# Patient Record
Sex: Female | Born: 1950 | Race: White | Hispanic: No | Marital: Married | Smoking: Never smoker
Health system: Southern US, Community
[De-identification: ages and names within clinical notes are randomized; demographics above are authoritative.]

## PROBLEM LIST (undated history)

## (undated) DIAGNOSIS — K219 Gastro-esophageal reflux disease without esophagitis: Secondary | ICD-10-CM

## (undated) DIAGNOSIS — M1990 Unspecified osteoarthritis, unspecified site: Secondary | ICD-10-CM

## (undated) DIAGNOSIS — I7121 Aneurysm of the ascending aorta, without rupture: Secondary | ICD-10-CM

## (undated) DIAGNOSIS — J302 Other seasonal allergic rhinitis: Secondary | ICD-10-CM

## (undated) DIAGNOSIS — S83207A Unspecified tear of unspecified meniscus, current injury, left knee, initial encounter: Secondary | ICD-10-CM

## (undated) DIAGNOSIS — I10 Essential (primary) hypertension: Secondary | ICD-10-CM

## (undated) DIAGNOSIS — F411 Generalized anxiety disorder: Secondary | ICD-10-CM

## (undated) DIAGNOSIS — H269 Unspecified cataract: Secondary | ICD-10-CM

## (undated) DIAGNOSIS — M7120 Synovial cyst of popliteal space [Baker], unspecified knee: Secondary | ICD-10-CM

## (undated) DIAGNOSIS — Z9889 Other specified postprocedural states: Secondary | ICD-10-CM

## (undated) DIAGNOSIS — T7840XA Allergy, unspecified, initial encounter: Secondary | ICD-10-CM

## (undated) DIAGNOSIS — Z8619 Personal history of other infectious and parasitic diseases: Secondary | ICD-10-CM

## (undated) DIAGNOSIS — T8859XA Other complications of anesthesia, initial encounter: Secondary | ICD-10-CM

## (undated) DIAGNOSIS — I781 Nevus, non-neoplastic: Secondary | ICD-10-CM

## (undated) DIAGNOSIS — R112 Nausea with vomiting, unspecified: Secondary | ICD-10-CM

## (undated) HISTORY — DX: Gastro-esophageal reflux disease without esophagitis: K21.9

## (undated) HISTORY — DX: Other seasonal allergic rhinitis: J30.2

## (undated) HISTORY — DX: Aneurysm of the ascending aorta, without rupture: I71.21

## (undated) HISTORY — DX: Unspecified tear of unspecified meniscus, current injury, left knee, initial encounter: S83.207A

## (undated) HISTORY — PX: BREAST BIOPSY: SHX20

## (undated) HISTORY — DX: Generalized anxiety disorder: F41.1

## (undated) HISTORY — PX: DENTAL SURGERY: SHX609

## (undated) HISTORY — DX: Synovial cyst of popliteal space (Baker), unspecified knee: M71.20

## (undated) HISTORY — PX: CATARACT EXTRACTION: SUR2

## (undated) HISTORY — DX: Nevus, non-neoplastic: I78.1

## (undated) HISTORY — PX: TONSILLECTOMY AND ADENOIDECTOMY: SUR1326

## (undated) HISTORY — PX: TOE SURGERY: SHX1073

## (undated) HISTORY — DX: Allergy, unspecified, initial encounter: T78.40XA

## (undated) HISTORY — DX: Personal history of other infectious and parasitic diseases: Z86.19

## (undated) HISTORY — DX: Unspecified cataract: H26.9

## (undated) HISTORY — PX: UPPER GASTROINTESTINAL ENDOSCOPY: SHX188

---

## 1998-05-20 HISTORY — PX: BREAST LUMPECTOMY: SHX2

## 1998-05-29 ENCOUNTER — Ambulatory Visit (HOSPITAL_COMMUNITY): Admission: RE | Admit: 1998-05-29 | Discharge: 1998-05-29 | Payer: Self-pay | Admitting: Obstetrics and Gynecology

## 1998-06-13 ENCOUNTER — Ambulatory Visit (HOSPITAL_COMMUNITY): Admission: RE | Admit: 1998-06-13 | Discharge: 1998-06-13 | Payer: Self-pay | Admitting: *Deleted

## 1998-09-21 ENCOUNTER — Other Ambulatory Visit: Admission: RE | Admit: 1998-09-21 | Discharge: 1998-09-21 | Payer: Self-pay | Admitting: Obstetrics and Gynecology

## 1999-06-05 ENCOUNTER — Ambulatory Visit (HOSPITAL_COMMUNITY): Admission: RE | Admit: 1999-06-05 | Discharge: 1999-06-05 | Payer: Self-pay | Admitting: Obstetrics and Gynecology

## 1999-06-05 ENCOUNTER — Encounter: Payer: Self-pay | Admitting: Obstetrics and Gynecology

## 1999-11-29 ENCOUNTER — Other Ambulatory Visit: Admission: RE | Admit: 1999-11-29 | Discharge: 1999-11-29 | Payer: Self-pay | Admitting: Obstetrics and Gynecology

## 2000-07-08 ENCOUNTER — Ambulatory Visit (HOSPITAL_COMMUNITY): Admission: RE | Admit: 2000-07-08 | Discharge: 2000-07-08 | Payer: Self-pay | Admitting: Obstetrics and Gynecology

## 2000-07-08 ENCOUNTER — Encounter: Payer: Self-pay | Admitting: Obstetrics and Gynecology

## 2000-07-11 ENCOUNTER — Encounter: Payer: Self-pay | Admitting: Emergency Medicine

## 2000-07-11 ENCOUNTER — Emergency Department (HOSPITAL_COMMUNITY): Admission: EM | Admit: 2000-07-11 | Discharge: 2000-07-11 | Payer: Self-pay | Admitting: Emergency Medicine

## 2001-01-28 ENCOUNTER — Other Ambulatory Visit: Admission: RE | Admit: 2001-01-28 | Discharge: 2001-01-28 | Payer: Self-pay | Admitting: Obstetrics and Gynecology

## 2001-05-26 ENCOUNTER — Encounter (INDEPENDENT_AMBULATORY_CARE_PROVIDER_SITE_OTHER): Payer: Self-pay | Admitting: *Deleted

## 2001-05-26 ENCOUNTER — Encounter: Payer: Self-pay | Admitting: Internal Medicine

## 2001-05-26 ENCOUNTER — Encounter: Admission: RE | Admit: 2001-05-26 | Discharge: 2001-05-26 | Payer: Self-pay | Admitting: Internal Medicine

## 2001-07-16 ENCOUNTER — Ambulatory Visit (HOSPITAL_COMMUNITY): Admission: RE | Admit: 2001-07-16 | Discharge: 2001-07-16 | Payer: Self-pay | Admitting: Obstetrics and Gynecology

## 2001-07-16 ENCOUNTER — Encounter: Payer: Self-pay | Admitting: Obstetrics and Gynecology

## 2001-08-22 ENCOUNTER — Emergency Department (HOSPITAL_COMMUNITY): Admission: EM | Admit: 2001-08-22 | Discharge: 2001-08-22 | Payer: Self-pay | Admitting: Emergency Medicine

## 2001-11-30 ENCOUNTER — Encounter (INDEPENDENT_AMBULATORY_CARE_PROVIDER_SITE_OTHER): Payer: Self-pay | Admitting: Gastroenterology

## 2002-02-01 ENCOUNTER — Other Ambulatory Visit: Admission: RE | Admit: 2002-02-01 | Discharge: 2002-02-01 | Payer: Self-pay | Admitting: Obstetrics and Gynecology

## 2002-07-19 ENCOUNTER — Ambulatory Visit (HOSPITAL_COMMUNITY): Admission: RE | Admit: 2002-07-19 | Discharge: 2002-07-19 | Payer: Self-pay | Admitting: Obstetrics and Gynecology

## 2002-07-19 ENCOUNTER — Encounter: Payer: Self-pay | Admitting: Obstetrics and Gynecology

## 2002-09-25 ENCOUNTER — Encounter (INDEPENDENT_AMBULATORY_CARE_PROVIDER_SITE_OTHER): Payer: Self-pay | Admitting: *Deleted

## 2002-10-05 ENCOUNTER — Encounter: Payer: Self-pay | Admitting: Gastroenterology

## 2002-10-05 ENCOUNTER — Ambulatory Visit (HOSPITAL_COMMUNITY): Admission: RE | Admit: 2002-10-05 | Discharge: 2002-10-05 | Payer: Self-pay | Admitting: Gastroenterology

## 2003-03-03 ENCOUNTER — Other Ambulatory Visit: Admission: RE | Admit: 2003-03-03 | Discharge: 2003-03-03 | Payer: Self-pay | Admitting: Obstetrics and Gynecology

## 2003-05-09 ENCOUNTER — Encounter: Payer: Self-pay | Admitting: Internal Medicine

## 2003-05-24 ENCOUNTER — Encounter: Payer: Self-pay | Admitting: Internal Medicine

## 2003-08-16 ENCOUNTER — Ambulatory Visit (HOSPITAL_COMMUNITY): Admission: RE | Admit: 2003-08-16 | Discharge: 2003-08-16 | Payer: Self-pay | Admitting: Obstetrics and Gynecology

## 2003-10-21 HISTORY — PX: COLONOSCOPY: SHX174

## 2003-11-07 ENCOUNTER — Ambulatory Visit (HOSPITAL_COMMUNITY): Admission: RE | Admit: 2003-11-07 | Discharge: 2003-11-07 | Payer: Self-pay | Admitting: Gastroenterology

## 2003-11-07 ENCOUNTER — Encounter (INDEPENDENT_AMBULATORY_CARE_PROVIDER_SITE_OTHER): Payer: Self-pay | Admitting: *Deleted

## 2004-05-02 ENCOUNTER — Encounter (INDEPENDENT_AMBULATORY_CARE_PROVIDER_SITE_OTHER): Payer: Self-pay | Admitting: Gastroenterology

## 2004-05-02 ENCOUNTER — Encounter: Payer: Self-pay | Admitting: Internal Medicine

## 2004-07-02 ENCOUNTER — Other Ambulatory Visit: Admission: RE | Admit: 2004-07-02 | Discharge: 2004-07-02 | Payer: Self-pay | Admitting: Obstetrics and Gynecology

## 2004-09-09 ENCOUNTER — Ambulatory Visit (HOSPITAL_COMMUNITY): Admission: RE | Admit: 2004-09-09 | Discharge: 2004-09-09 | Payer: Self-pay | Admitting: Internal Medicine

## 2004-10-07 ENCOUNTER — Ambulatory Visit: Payer: Self-pay | Admitting: Internal Medicine

## 2004-11-27 ENCOUNTER — Ambulatory Visit: Payer: Self-pay | Admitting: Gastroenterology

## 2004-12-03 ENCOUNTER — Ambulatory Visit: Payer: Self-pay | Admitting: Gastroenterology

## 2005-07-01 ENCOUNTER — Ambulatory Visit: Payer: Self-pay | Admitting: Internal Medicine

## 2005-07-16 ENCOUNTER — Other Ambulatory Visit: Admission: RE | Admit: 2005-07-16 | Discharge: 2005-07-16 | Payer: Self-pay | Admitting: Obstetrics and Gynecology

## 2005-07-23 ENCOUNTER — Ambulatory Visit: Payer: Self-pay | Admitting: Gastroenterology

## 2005-08-25 ENCOUNTER — Encounter (INDEPENDENT_AMBULATORY_CARE_PROVIDER_SITE_OTHER): Payer: Self-pay | Admitting: *Deleted

## 2005-08-25 ENCOUNTER — Encounter: Admission: RE | Admit: 2005-08-25 | Discharge: 2005-08-25 | Payer: Self-pay | Admitting: General Surgery

## 2005-09-16 ENCOUNTER — Ambulatory Visit: Payer: Self-pay | Admitting: Internal Medicine

## 2005-09-23 ENCOUNTER — Ambulatory Visit: Payer: Self-pay | Admitting: Internal Medicine

## 2005-09-29 ENCOUNTER — Ambulatory Visit (HOSPITAL_COMMUNITY): Admission: RE | Admit: 2005-09-29 | Discharge: 2005-09-29 | Payer: Self-pay | Admitting: Obstetrics and Gynecology

## 2005-09-30 ENCOUNTER — Encounter: Payer: Self-pay | Admitting: Internal Medicine

## 2005-09-30 ENCOUNTER — Ambulatory Visit: Payer: Self-pay | Admitting: Internal Medicine

## 2005-12-22 ENCOUNTER — Ambulatory Visit: Payer: Self-pay | Admitting: Internal Medicine

## 2006-07-16 ENCOUNTER — Other Ambulatory Visit: Admission: RE | Admit: 2006-07-16 | Discharge: 2006-07-16 | Payer: Self-pay | Admitting: Obstetrics & Gynecology

## 2006-10-01 ENCOUNTER — Ambulatory Visit (HOSPITAL_COMMUNITY): Admission: RE | Admit: 2006-10-01 | Discharge: 2006-10-01 | Payer: Self-pay | Admitting: Internal Medicine

## 2007-01-05 ENCOUNTER — Ambulatory Visit: Payer: Self-pay | Admitting: Internal Medicine

## 2007-01-12 ENCOUNTER — Ambulatory Visit: Payer: Self-pay

## 2007-02-11 ENCOUNTER — Ambulatory Visit: Payer: Self-pay | Admitting: Vascular Surgery

## 2007-03-03 ENCOUNTER — Ambulatory Visit: Payer: Self-pay | Admitting: Gastroenterology

## 2007-05-31 ENCOUNTER — Telehealth: Payer: Self-pay | Admitting: Internal Medicine

## 2007-07-14 DIAGNOSIS — K219 Gastro-esophageal reflux disease without esophagitis: Secondary | ICD-10-CM

## 2007-07-14 DIAGNOSIS — J45909 Unspecified asthma, uncomplicated: Secondary | ICD-10-CM | POA: Insufficient documentation

## 2007-08-19 ENCOUNTER — Ambulatory Visit: Payer: Self-pay | Admitting: Vascular Surgery

## 2007-08-26 ENCOUNTER — Other Ambulatory Visit: Admission: RE | Admit: 2007-08-26 | Discharge: 2007-08-26 | Payer: Self-pay | Admitting: Obstetrics & Gynecology

## 2007-08-26 LAB — CONVERTED CEMR LAB: Pap Smear: NORMAL

## 2007-09-14 ENCOUNTER — Ambulatory Visit: Payer: Self-pay | Admitting: Internal Medicine

## 2007-10-05 ENCOUNTER — Ambulatory Visit (HOSPITAL_COMMUNITY): Admission: RE | Admit: 2007-10-05 | Discharge: 2007-10-05 | Payer: Self-pay | Admitting: Obstetrics and Gynecology

## 2007-12-10 ENCOUNTER — Ambulatory Visit: Payer: Self-pay | Admitting: Family Medicine

## 2007-12-10 ENCOUNTER — Encounter: Payer: Self-pay | Admitting: Internal Medicine

## 2007-12-24 ENCOUNTER — Ambulatory Visit: Payer: Self-pay | Admitting: Internal Medicine

## 2008-01-14 DIAGNOSIS — I868 Varicose veins of other specified sites: Secondary | ICD-10-CM | POA: Insufficient documentation

## 2008-01-17 ENCOUNTER — Ambulatory Visit: Payer: Self-pay | Admitting: Internal Medicine

## 2008-04-13 ENCOUNTER — Ambulatory Visit: Payer: Self-pay | Admitting: Internal Medicine

## 2008-04-13 LAB — CONVERTED CEMR LAB
ALT: 22 units/L (ref 0–35)
AST: 27 units/L (ref 0–37)
Albumin: 4 g/dL (ref 3.5–5.2)
Alkaline Phosphatase: 55 units/L (ref 39–117)
BUN: 17 mg/dL (ref 6–23)
Basophils Absolute: 0 10*3/uL (ref 0.0–0.1)
Basophils Relative: 0.3 % (ref 0.0–1.0)
Bilirubin, Direct: 0.1 mg/dL (ref 0.0–0.3)
Blood in Urine, dipstick: NEGATIVE
CO2: 28 meq/L (ref 19–32)
Calcium: 9.3 mg/dL (ref 8.4–10.5)
Chloride: 103 meq/L (ref 96–112)
Cholesterol: 218 mg/dL (ref 0–200)
Creatinine, Ser: 0.8 mg/dL (ref 0.4–1.2)
Direct LDL: 136.5 mg/dL
Eosinophils Absolute: 0.2 10*3/uL (ref 0.0–0.7)
Eosinophils Relative: 3 % (ref 0.0–5.0)
GFR calc Af Amer: 95 mL/min
GFR calc non Af Amer: 78 mL/min
Glucose, Bld: 83 mg/dL (ref 70–99)
Glucose, Urine, Semiquant: NEGATIVE
HCT: 37.3 % (ref 36.0–46.0)
HDL: 50.1 mg/dL (ref >39.0)
Hemoglobin: 13 g/dL (ref 12.0–15.0)
Ketones, urine, test strip: NEGATIVE
Lymphocytes Relative: 46 % (ref 12.0–46.0)
MCHC: 34.9 g/dL (ref 30.0–36.0)
MCV: 94.5 fL (ref 78.0–100.0)
Monocytes Absolute: 0.8 10*3/uL (ref 0.1–1.0)
Monocytes Relative: 13.8 % — ABNORMAL HIGH (ref 3.0–12.0)
Neutro Abs: 2.3 10*3/uL (ref 1.4–7.7)
Neutrophils Relative %: 36.9 % — ABNORMAL LOW (ref 43.0–77.0)
Nitrite: NEGATIVE
Platelets: 235 10*3/uL (ref 150–400)
Potassium: 3.6 meq/L (ref 3.5–5.1)
Protein, U semiquant: NEGATIVE
RBC: 3.95 M/uL (ref 3.87–5.11)
RDW: 11.9 % (ref 11.5–14.6)
Sodium: 138 meq/L (ref 135–145)
Specific Gravity, Urine: 1.02
TSH: 1.43 microintl units/mL (ref 0.35–5.50)
Total Bilirubin: 1.2 mg/dL (ref 0.3–1.2)
Total CHOL/HDL Ratio: 4.4
Total Protein: 6.4 g/dL (ref 6.0–8.3)
Triglycerides: 161 mg/dL — ABNORMAL HIGH (ref 0–149)
Urobilinogen, UA: 0.2
VLDL: 32 mg/dL (ref 0–40)
WBC Urine, dipstick: NEGATIVE
WBC: 6.1 10*3/uL (ref 4.5–10.5)
pH: 5

## 2008-04-14 ENCOUNTER — Ambulatory Visit: Payer: Self-pay | Admitting: Internal Medicine

## 2008-05-30 ENCOUNTER — Ambulatory Visit: Payer: Self-pay | Admitting: Vascular Surgery

## 2008-07-22 ENCOUNTER — Ambulatory Visit: Payer: Self-pay | Admitting: Internal Medicine

## 2008-08-25 ENCOUNTER — Other Ambulatory Visit: Admission: RE | Admit: 2008-08-25 | Discharge: 2008-08-25 | Payer: Self-pay | Admitting: Obstetrics and Gynecology

## 2008-10-17 ENCOUNTER — Ambulatory Visit: Payer: Self-pay | Admitting: Family Medicine

## 2008-11-20 ENCOUNTER — Telehealth: Payer: Self-pay | Admitting: Internal Medicine

## 2009-01-30 ENCOUNTER — Ambulatory Visit: Payer: Self-pay | Admitting: Internal Medicine

## 2009-02-01 ENCOUNTER — Encounter: Payer: Self-pay | Admitting: Internal Medicine

## 2009-02-20 ENCOUNTER — Ambulatory Visit (HOSPITAL_COMMUNITY): Admission: RE | Admit: 2009-02-20 | Discharge: 2009-02-20 | Payer: Self-pay | Admitting: Orthopedic Surgery

## 2009-03-07 ENCOUNTER — Ambulatory Visit (HOSPITAL_COMMUNITY): Admission: RE | Admit: 2009-03-07 | Discharge: 2009-03-07 | Payer: Self-pay | Admitting: Obstetrics and Gynecology

## 2009-03-14 ENCOUNTER — Encounter: Admission: RE | Admit: 2009-03-14 | Discharge: 2009-03-14 | Payer: Self-pay | Admitting: Orthopedic Surgery

## 2009-04-10 ENCOUNTER — Telehealth: Payer: Self-pay | Admitting: Internal Medicine

## 2009-05-10 ENCOUNTER — Ambulatory Visit: Payer: Self-pay | Admitting: Family Medicine

## 2009-05-10 LAB — CONVERTED CEMR LAB: Rapid Strep: NEGATIVE

## 2009-08-21 ENCOUNTER — Encounter: Admission: RE | Admit: 2009-08-21 | Discharge: 2009-08-21 | Payer: Self-pay | Admitting: Orthopedic Surgery

## 2009-11-12 ENCOUNTER — Telehealth: Payer: Self-pay | Admitting: Internal Medicine

## 2009-11-14 ENCOUNTER — Telehealth: Payer: Self-pay | Admitting: Internal Medicine

## 2009-12-24 ENCOUNTER — Ambulatory Visit: Payer: Self-pay | Admitting: Internal Medicine

## 2010-01-29 ENCOUNTER — Telehealth: Payer: Self-pay | Admitting: Internal Medicine

## 2010-01-29 ENCOUNTER — Ambulatory Visit: Payer: Self-pay | Admitting: Family Medicine

## 2010-01-29 LAB — CONVERTED CEMR LAB: Rapid Strep: NEGATIVE

## 2010-01-31 ENCOUNTER — Ambulatory Visit: Payer: Self-pay | Admitting: Internal Medicine

## 2010-02-01 ENCOUNTER — Encounter: Payer: Self-pay | Admitting: Internal Medicine

## 2010-03-07 ENCOUNTER — Telehealth: Payer: Self-pay | Admitting: Internal Medicine

## 2010-03-13 ENCOUNTER — Encounter: Payer: Self-pay | Admitting: Internal Medicine

## 2010-03-13 ENCOUNTER — Ambulatory Visit (HOSPITAL_COMMUNITY): Admission: RE | Admit: 2010-03-13 | Discharge: 2010-03-13 | Payer: Self-pay | Admitting: Internal Medicine

## 2010-03-25 ENCOUNTER — Telehealth: Payer: Self-pay | Admitting: Internal Medicine

## 2010-05-20 ENCOUNTER — Ambulatory Visit: Payer: Self-pay | Admitting: Family Medicine

## 2010-05-22 ENCOUNTER — Ambulatory Visit: Payer: Self-pay | Admitting: Internal Medicine

## 2010-05-22 LAB — CONVERTED CEMR LAB
ALT: 21 units/L (ref 0–35)
AST: 27 units/L (ref 0–37)
Albumin: 4 g/dL (ref 3.5–5.2)
Alkaline Phosphatase: 59 units/L (ref 39–117)
BUN: 11 mg/dL (ref 6–23)
Basophils Absolute: 0 10*3/uL (ref 0.0–0.1)
Basophils Relative: 0.6 % (ref 0.0–3.0)
Bilirubin Urine: NEGATIVE
Bilirubin, Direct: 0.1 mg/dL (ref 0.0–0.3)
Blood in Urine, dipstick: NEGATIVE
CO2: 30 meq/L (ref 19–32)
Calcium: 9.4 mg/dL (ref 8.4–10.5)
Chloride: 100 meq/L (ref 96–112)
Cholesterol: 218 mg/dL — ABNORMAL HIGH (ref 0–200)
Creatinine, Ser: 0.7 mg/dL (ref 0.4–1.2)
Direct LDL: 141.4 mg/dL
Eosinophils Absolute: 0.2 10*3/uL (ref 0.0–0.7)
Eosinophils Relative: 2.8 % (ref 0.0–5.0)
GFR calc non Af Amer: 87.71 mL/min (ref >60)
Glucose, Bld: 85 mg/dL (ref 70–99)
Glucose, Urine, Semiquant: NEGATIVE
HCT: 38.5 % (ref 36.0–46.0)
HDL: 65.5 mg/dL (ref >39.00)
Hemoglobin: 13.2 g/dL (ref 12.0–15.0)
Ketones, urine, test strip: NEGATIVE
Lymphocytes Relative: 40.4 % (ref 12.0–46.0)
Lymphs Abs: 2.6 10*3/uL (ref 0.7–4.0)
MCHC: 34.3 g/dL (ref 30.0–36.0)
MCV: 96.5 fL (ref 78.0–100.0)
Monocytes Absolute: 0.8 10*3/uL (ref 0.1–1.0)
Monocytes Relative: 12.8 % — ABNORMAL HIGH (ref 3.0–12.0)
Neutro Abs: 2.8 10*3/uL (ref 1.4–7.7)
Neutrophils Relative %: 43.4 % (ref 43.0–77.0)
Nitrite: NEGATIVE
Platelets: 221 10*3/uL (ref 150.0–400.0)
Potassium: 3.8 meq/L (ref 3.5–5.1)
Protein, U semiquant: NEGATIVE
RBC: 3.99 M/uL (ref 3.87–5.11)
RDW: 13.3 % (ref 11.5–14.6)
Sodium: 138 meq/L (ref 135–145)
Specific Gravity, Urine: 1.015
TSH: 1.45 microintl units/mL (ref 0.35–5.50)
Total Bilirubin: 0.8 mg/dL (ref 0.3–1.2)
Total CHOL/HDL Ratio: 3
Total Protein: 6.2 g/dL (ref 6.0–8.3)
Triglycerides: 114 mg/dL (ref 0.0–149.0)
Urobilinogen, UA: 0.2
VLDL: 22.8 mg/dL (ref 0.0–40.0)
WBC Urine, dipstick: NEGATIVE
WBC: 6.5 10*3/uL (ref 4.5–10.5)
pH: 6.5

## 2010-05-30 ENCOUNTER — Ambulatory Visit: Payer: Self-pay | Admitting: Internal Medicine

## 2010-11-09 ENCOUNTER — Encounter: Payer: Self-pay | Admitting: General Surgery

## 2010-11-10 ENCOUNTER — Encounter: Payer: Self-pay | Admitting: Obstetrics and Gynecology

## 2010-11-11 ENCOUNTER — Encounter: Payer: Self-pay | Admitting: Orthopedic Surgery

## 2010-11-19 NOTE — Letter (Signed)
Summary: Patient [facility] Biopsy Results  [facility]  [address] [place], [place] [address]   Phone: [phone]  Fax: [phone]        February 01, 2010 MRN: [id number]    [name] [address] [place], [place]  [address]    Dear Ms. Grieder,  I am pleased to inform you that the biopsies taken during your recent endoscopic examination did not show any evidence of cancer upon pathologic examination.The is a mild inflammation in Your stomach, and normal  tissue from the esophagus.  Additional information/recommendations:  __No further action is needed at this time.  Please follow-up with      your primary care physician for your other healthcare needs.  __ Please call [phone] to schedule a return visit to review      your condition.  _x_ Continue with the treatment plan as outlined on the day of your      exam.  _   Please call us if you are having persistent problems or have questions about your condition that have not been fully answered at this time.  Sincerely,  Hart Carwin MD  This letter has been electronically signed by your physician.  Appended Document: Patient Notice-Endo Biopsy Results letter mailed 4.18.11

## 2010-11-19 NOTE — Miscellaneous (Signed)
Summary: aciphex rx.  Clinical Lists Changes  Medications: Added new medication of ACIPHEX 20 MG  TBEC (RABEPRAZOLE SODIUM) Take 1 twice a day 30 minutes before meals - Signed Rx of ACIPHEX 20 MG  TBEC (RABEPRAZOLE SODIUM) Take 1 twice a day 30 minutes before meals;  #60 x 1;  Signed;  Entered by: Darlyn Read RN;  Authorized by: Hart Carwin MD;  Method used: [facility] [phone]*, [address], [place], [place]  [address], Ph: [phone] or [phone], Fax: [phone]    Prescriptions: ACIPHEX 20 MG  TBEC (RABEPRAZOLE SODIUM) Take 1 twice a day 30 minutes before meals  #60 x 1   Entered by:   Darlyn Read RN   Authorized by:   Hart Carwin MD   Signed by:   Darlyn Read RN on 01/31/2010   Method used:   [facility]        [facility] [phone]* (retail)       [address]       [place], [place]  [address]       Ph: [phone] or [phone]       Fax: [phone]   RxID:   [id number]

## 2010-11-19 NOTE — Progress Notes (Signed)
Summary: refill and dr switch request   Phone Note Call from Patient Call back at Home Phone [phone]   Caller: Patient Call For: Dr. Leone Payor Reason for Call: Refill Medication, Talk to Nurse Summary of Call: pt would like refill of generic Protonix... [facility] [address]  pt would also like to switch her care to Dr. Juanda Chance because she would prefer a female physician Initial call taken by: Vallarie Mare,  November 12, 2009 9:57 AM  Follow-up for Phone Call        Please ask Dr Leone Payor first. Follow-up by: Hart Carwin MD,  November 12, 2009 1:58 PM  Additional Follow-up for Phone Call Additional follow up Details #1::        If Dr. Juanda Chance has room for her then she may switch. Additional Follow-up by: Iva Boop MD, Clementeen Graham,  November 12, 2009 2:01 PM    Additional Follow-up for Phone Call Additional follow up Details #2::    DR.--Will you accept this pt? Laureen Ochs LPN  November 13, 2009 8:19 AM   Additional Follow-up for Phone Call Additional follow up Details #3:: Details for Additional Follow-up Action Taken: OK   Appended Document: refill and dr switch request Message left for pt. to call and schedule an appt. with Dr..

## 2010-11-19 NOTE — Assessment & Plan Note (Signed)
Summary: st/njr   Vital Signs:  Patient profile:   60 year old female Weight:      151 pounds Temp:     98.3 degrees F oral BP sitting:   154 / 74  (left arm) Cuff size:   regular  Vitals Entered By: Kern Reap CMA Duncan Dull) (January 29, 2010 4:35 PM) CC: sore throat since Sunday (01/27/2010)   Primary Care Provider:  Birdie Sons, MD  CC:  sore throat since Sunday (01/27/2010).  History of Present Illness: [name] is a 52-year-old female, who comes in today with a 3-day history of a sore throat.  Her granddaughter was sick about two weeks ago with a sore throat.  She said no fever, earache, cough, etc..  Rapid strep negative  She is due to have endoscopy later on this week by Dr. Juanda Chance  she states that she had a similar problem when she was in [place] at one time and was given a shot of antibiotics.  It explained the difference between a viral and bacterial infection and antibiotics did not work, in  viral infection  Allergies: 1)  ! Sulfa 2)  ! Cefdinir (Cefdinir) 3)  Nexium  Past History:  Past medical, surgical, family and social histories (including risk factors) reviewed for relevance to current acute and chronic problems.  Past Medical History: Reviewed history from 07/14/2007 and no changes required. Fainting Spells Allergies UTIs Asthma GERD  Past Surgical History: Reviewed history from 12/20/2009 and no changes required. Tonsillectomy Vein Stripping Breast Lumpectomy-benign  Family History: Reviewed history from 12/24/2009 and no changes required. Family History of Alcoholism/Addiction Family History Diabetes 1st degree relative:Sister, Grandmother Family History Hypertension No FH of Colon Cancer: Family History of Heart Disease: Mother  Social History: Reviewed history from 12/24/2009 and no changes required. Married Never Smoked Alcohol use-yes Drug use-no Regular exercise-yes Daily Caffeine Use 1.5 cup coffee  Review of Systems      See  HPI  Physical Exam  General:  Well-developed,well-nourished,in no acute distress; alert,appropriate and cooperative throughout examination Head:  Normocephalic and atraumatic without obvious abnormalities. No apparent alopecia or balding. Eyes:  No corneal or conjunctival inflammation noted. EOMI. Perrla. Funduscopic exam benign, without hemorrhages, exudates or papilledema. Vision grossly normal. Ears:  External ear exam shows no significant lesions or deformities.  Otoscopic examination reveals clear canals, tympanic membranes are intact bilaterally without bulging, retraction, inflammation or discharge. Hearing is grossly normal bilaterally. Nose:  External nasal examination shows no deformity or inflammation. Nasal mucosa are pink and moist without lesions or exudates. Mouth:   red.  Throat is symmetrical.  No pus. Cervical Nodes:  No lymphadenopathy noted   Impression & Recommendations:  Problem # 1:  SORE THROAT (ICD-462) Assessment New  Orders: Rapid Strep ([address])  Complete Medication List: 1)  Valtrex 500 Mg Tabs (Valacyclovir hcl) .... One tablet once a day as needed 2)  Clorazepate Dipotassium 7.5 Mg Tabs (Clorazepate dipotassium) .... Take one tablet as needed 3)  Pantoprazole Sodium 40 Mg Tbec (Pantoprazole sodium) .... Take 1 tablet by mouth twice a day 4)  Vagifem 25 Mcg Tabs (Estradiol) .... Take one tab two times a week 5)  Zovirax 5 % Oint (Acyclovir) .... Use as directed as needed 6)  Librax 2.5-5 Mg Caps (Clidinium-chlordiazepoxide) .... Take 1 tablet by mouth two times a day as needed for abdominal pain 7)  Hydromet 5-1.5 Mg/43ml Syrp (Hydrocodone-homatropine) .... 1/2 to 1 tsp three times a day as needed  Patient Instructions: 1)  u mat take hydromet  0.5-teaspoon 3 times a day as needed for sore throat pain.  Return p.r.n. Prescriptions: HYDROMET 5-1.5 MG/5ML SYRP (HYDROCODONE-HOMATROPINE) 1/2 to 1 tsp three times a day as needed  #4oz x 1   Entered and  Authorized by:   Roderick Pee MD   Signed by:   Roderick Pee MD on 01/29/2010   Method used:   Print then Give to Patient   RxID:   [id number]   Laboratory Results  Date/Time Received: January 29, 2010 4:46 PM Date/Time Reported: January 29, 2010 4:46 PM  Other Tests  Rapid Strep: negative Comments: Kern Reap CMA Duncan Dull)  January 29, 2010 4:46 PM

## 2010-11-19 NOTE — Procedures (Signed)
Summary: Upper Endoscopy  Patient: [name] Note: All result statuses are Final unless otherwise noted.  Tests: (1) Upper Endoscopy (EGD)   EGD Upper Endoscopy       DONE     [facility]     [address]     [place], [place]  [address]           ENDOSCOPY PROCEDURE REPORT           PATIENT:  Erin, [name]  MR#:  [id number]     BIRTHDATE:  02/15/1951, 59 yrs. old  GENDER:  female           ENDOSCOPIST:  Hedwig Morton. Juanda Chance, MD     Referred by:  Birdie Sons, M.D.           PROCEDURE DATE:  01/31/2010     PROCEDURE:  EGD with biopsy     ASA CLASS:  Class I     INDICATIONS:  GERD, heartburn refractory to Pantoprazole, but     respoded to Aciphex 20 mg bid samples           MEDICATIONS:   Versed 6 mg, Fentanyl 50 mcg     TOPICAL ANESTHETIC:  Cetacaine Spray           DESCRIPTION OF PROCEDURE:   After the risks benefits and     alternatives of the procedure were thoroughly explained, informed     consent was obtained.  The LB [id number]-H180 [id number] endoscope was     introduced through the mouth and advanced to the second portion of     the duodenum, without limitations.  The instrument was slowly     withdrawn as the mucosa was fully examined.     <<PROCEDUREIMAGES>>           irregular Z-line. With standard forceps, a biopsy was obtained and     sent to pathology (see image4).  Otherwise the examination was     normal (see image1, image2, and image3).    Retroflexed views     revealed no abnormalities.    The scope was then withdrawn from     the patient and the procedure completed.           COMPLICATIONS:  None           ENDOSCOPIC IMPRESSION:     1) Irregular Z-line     2) Otherwise normal examination     RECOMMENDATIONS:     Aciphex 20 mg po bid, #60           REPEAT EXAM:  In 0 year(s) for.           ______________________________     Hedwig Morton. Juanda Chance, MD           CC:           n.     eSIGNED:   Hedwig Morton.  at 01/31/2010 09:26 AM           Milus Mallick, [id number]  Note: An exclamation mark (!) indicates a result that was not dispersed into the flowsheet. Document Creation Date: 01/31/2010 9:26 AM _______________________________________________________________________  (1) Order result status: Final Collection or observation date-time: 01/31/2010 09:12 Requested date-time:  Receipt date-time:  Reported date-time:  Referring Physician:   Ordering Physician: Lina Sar [phone]) Specimen Source:  Source: Launa Grill Order Number: [id number] Lab site:

## 2010-11-19 NOTE — Letter (Signed)
Summary: EGD Instructions  [facility]  [address] [place], [place] [address]   Phone: [phone]  Fax: [phone]       Jericca Effertz    1951/05/04    MRN: [id number]       Procedure Day /Date: 01/24/10      Arrival Time: 7:30 am     Procedure Time: 8 am     Location of Procedure:                    _  x_ [facility] (4th Floor)  PREPARATION FOR ENDOSCOPY   On 01/24/10 THE DAY OF THE PROCEDURE:  1.   No solid foods, milk or milk products are allowed after midnight the night before your procedure.  2.   Do not drink anything colored red or purple.  Avoid juices with pulp.  No orange juice.  3.  You may drink clear liquids until 6: 00 am, which is 2 hours before your procedure.                                                                                                CLEAR LIQUIDS INCLUDE: Water Jello Ice Popsicles Tea (sugar ok, no milk/cream) Powdered fruit flavored drinks Coffee (sugar ok, no milk/cream) Gatorade Juice: apple, white grape, white cranberry  Lemonade Clear bullion, consomm, broth Carbonated beverages (any kind) Strained chicken noodle soup Hard Candy   MEDICATION INSTRUCTIONS  Unless otherwise instructed, you should take regular prescription medications with a small sip of water as early as possible the morning of your procedure.                  OTHER INSTRUCTIONS  You will need a responsible adult at least 61 years of age to accompany you and drive you home.   This person must remain in the waiting room during your procedure.  Wear loose fitting clothing that is easily removed.  Leave jewelry and other valuables at home.  However, you may wish to bring a book to read or an iPod/MP3 player to listen to music as you wait for your procedure to start.  Remove all body piercing jewelry and leave at home.  Total time from sign-in until discharge is approximately 2-3 hours.  You should go home  directly after your procedure and rest.  You can resume normal activities the day after your procedure.  The day of your procedure you should not:   Drive   Make legal decisions   Operate machinery   Drink alcohol   Return to work  You will receive specific instructions about eating, activities and medications before you leave.    The above instructions have been reviewed and explained to me by  Hortense Ramal CMA Duncan Dull)  December 24, 2009 5:53 PM _    I fully understand and can verbalize these instructions _____________________________ Date 12/24/09

## 2010-11-19 NOTE — Progress Notes (Signed)
Summary: Change PPI   Phone Note From Pharmacy   Call For: Dr Juanda Chance  Summary of Call: We [facility] that [facility] requests to change to a cheaper alternative to aciphex. Choses for cheaper alternative include omepraole, pantoprazole and lansoprazole. Unfortuntately patinet has tried pantoprazole in the past which has been ineffective. We will change her to lansoprazole at this time.  Initial call taken by: Lamona Curl CMA (AAMA),  March 25, 2010 11:34 AM    New/Updated Medications: LANSOPRAZOLE 30 MG CPDR (LANSOPRAZOLE) Take 1 tablet by mouth two times a day Prescriptions: LANSOPRAZOLE 30 MG CPDR (LANSOPRAZOLE) Take 1 tablet by mouth two times a day  #60 x 1   Entered by:   Lamona Curl CMA (AAMA)   Authorized by:   Hart Carwin MD   Signed by:   Lamona Curl CMA (AAMA) on 03/25/2010   Method used:   [facility]ron[facility]        [facility] [phone]* (retail)       [address]       [place], [place]  [address]       Ph: [phone] or [phone]       Fax: [phone]   RxID:   [id number]   Appended Document: Change PPI    Clinical Lists Changes  Medications: Rx of LANSOPRAZOLE 30 MG CPDR (LANSOPRAZOLE) Take 1 tablet by mouth two times a day;  #60 x 1;  Signed;  Entered by: Lamona Curl CMA (AAMA);  Authorized by: Lamona Curl CMA (AAMA);  Method used: Printed then [facility] to [id number]*, [address] [place], [place]  [address], Ph: [phone] or [phone], Fax: [phone] Rx of LANSOPRAZOLE 30 MG CPDR (LANSOPRAZOLE) Take 1 tablet by mouth two times a day;  #60 x 1;  Signed;  Entered by: Lamona Curl CMA (AAMA);  Authorized by: Hart Carwin MD;  Method used: Printed then faxed to [facility] [phone]*, [address], [place], [place]  [address], Ph: [phone] or [phone], Fax: [phone]    Prescriptions: LANSOPRAZOLE 30 MG CPDR (LANSOPRAZOLE) Take 1 tablet by mouth two times a day  #60 x 1   Entered by:   Lamona Curl CMA (AAMA)   Authorized by:   Hart Carwin MD   Signed by:   Lamona Curl CMA (AAMA) on 04/01/2010   Method used:   Printed then faxed to ...       [facility] [phone]* (retail)       [address]       [place], [place]  [address]       Ph: [phone] or [phone]       Fax: [phone]   RxID:   [id number] LANSOPRAZOLE 30 MG CPDR (LANSOPRAZOLE) Take 1 tablet by mouth two times a day  #60 x 1   Entered and Authorized by:   Lamona Curl CMA (AAMA)   Signed by:   Lamona Curl CMA (AAMA) on 04/01/2010   Method used:   Printed then faxed to ...       [facility] [address]* (retail)       [address]       [place], [place]  [address]       Ph: [phone] or [phone]       Fax: [phone]   RxID:   [id number]  [facility] to [facility] [address]. Dottie Nelson-Smith  CMA Duncan Dull)  April 01, 2010 10:01 AM

## 2010-11-19 NOTE — Assessment & Plan Note (Signed)
Summary: rx renewal....em   History of Present Illness Visit Type: Follow-up Visit Primary GI MD: Lina Sar MD Primary Provider: Birdie Sons, MD Chief Complaint: pt is having increased reflux and indigestion on pantoprazole, pt states she did fine on brand name protonix History of Present Illness:   This is a 60 year old white female with persistent left upper quadrant abdominal discomfort and burning sensation. The symptoms started after switching from Protonix to pantoprazole. She has been under stress because her handicapped granddaughter had pneumonia. An upper endoscopy in the 1990s showed H. pylori which she was later treated for.  A repeat upper endoscopy in 2003 showed a negative CLO i test and gastritis in the antrum. A CT Scan of the abdomen in December 2003 showed small liver cysts which were stable on a CT scan in 2005 and again in 2006. Her last colonoscopy for screening was in July 2005 and there were no polyps.   GI Review of Systems    Reports acid reflux and  heartburn.      Denies abdominal pain, belching, bloating, chest pain, dysphagia with liquids, dysphagia with solids, loss of appetite, nausea, vomiting, vomiting blood, weight loss, and  weight gain.        Denies anal fissure, black tarry stools, change in bowel habit, constipation, diarrhea, diverticulosis, fecal incontinence, heme positive stool, hemorrhoids, irritable bowel syndrome, jaundice, light color stool, liver problems, rectal bleeding, and  rectal pain.    Current Medications (verified): 1)  Valtrex 500 Mg Tabs (Valacyclovir Hcl) .... One Tablet Once A Day As Needed 2)  Clorazepate Dipotassium 7.5 Mg  Tabs (Clorazepate Dipotassium) .... Take One Tablet As Needed 3)  Pantoprazole Sodium 40 Mg  Tbec (Pantoprazole Sodium) .... Take 1 Tablet By Mouth Twice A Day 4)  Vagifem 25 Mcg Tabs (Estradiol) .... Take One Tab Two Times A Week 5)  Zovirax 5 % Oint (Acyclovir) .... Use As Directed As  Needed  Allergies: 1)  ! Sulfa 2)  ! Cefdinir (Cefdinir) 3)  Nexium  Past History:  Past Medical History: Reviewed history from 07/14/2007 and no changes required. Fainting Spells Allergies UTIs Asthma GERD  Past Surgical History: Reviewed history from 12/20/2009 and no changes required. Tonsillectomy Vein Stripping Breast Lumpectomy-benign  Family History: Family History of Alcoholism/Addiction Family History Diabetes 1st degree relative:Sister, Grandmother Family History Hypertension No FH of Colon Cancer: Family History of Heart Disease: Mother  Social History: Married Never Smoked Alcohol use-yes Drug use-no Regular exercise-yes Daily Caffeine Use 1.5 cup coffee  Review of Systems  The patient denies allergy/sinus, anemia, anxiety-new, arthritis/joint pain, back pain, blood in urine, breast changes/lumps, confusion, cough, coughing up blood, depression-new, fainting, fatigue, fever, headaches-new, hearing problems, heart murmur, heart rhythm changes, itching, menstrual pain, muscle pains/cramps, night sweats, nosebleeds, pregnancy symptoms, shortness of breath, skin rash, sleeping problems, sore throat, swelling of feet/legs, swollen lymph glands, thirst - excessive, urination - excessive, urination changes/pain, urine leakage, vision changes, and voice change.         Pertinent positive and negative review of systems were noted in the above HPI. All other ROS was otherwise negative.   Vital Signs:  Patient profile:   60 year old female Height:      65.75 inches Weight:      148 pounds BMI:     24.16 Pulse rate:   60 / minute Pulse rhythm:   regular BP sitting:   120 / 84  (left arm) Cuff size:   regular  Vitals Entered By:  Francee Piccolo CMA Duncan Dull) (December 24, 2009 5:07 PM)  Physical Exam  General:  Well developed, well nourished, no acute distress. Eyes:  PERRLA, no icterus. Mouth:  No deformity or lesions, dentition normal. Neck:  Supple; no  masses or thyromegaly. Lungs:  Clear throughout to auscultation. Heart:  Regular rate and rhythm; no murmurs, rubs,  or bruits. Abdomen:  soft abdomen with normoactive bowel sounds and minimal tenderness in the left costal margin. There was no CVA tenderness. Spleen was not enlarged. Right upper quadrant was unremarkable. Lower abdomen was unremarkable. Extremities:  No clubbing, cyanosis, edema or deformities noted. Skin:  Intact without significant lesions or rashes. Psych:  Alert and cooperative. Normal mood and affect.   Impression & Recommendations:  Problem # 1:  GERD (ICD-530.81)  Patient has a history of gastroesophageal reflux initially controlled on Protonix but poorly controlled on pantoprazole. She has a history of H. pylori gastritis which was treated. We need to rule out recurrent H. pylori gastropathy. We will start her on AcipHex 20 mg p.o. b.i.d. and schedule her for an upper endoscopy as well.  Orders: EGD (EGD)  Problem # 2:  HEMORRHOIDS (ICD-455.6) Patient has a history of hemorrhoids which were noted on her colonoscopy in July 2005. A recall colonoscopy will be due in July 2015.  Patient Instructions: 1)  AcipHex 20 mg p.o. b.i.d. in place of generic Protonix. 2)  Librax one p.o. b.i.d. p.r.n. abdominal pain. 3)  Upper endoscopy and biopsies for H. pylori. 4)  Recall colonoscopy July 2015. 5)  Copy sent to : Dr Birdie Sons 6)  The medication list was reviewed and reconciled.  All changed / newly prescribed medications were explained.  A complete medication list was provided to the patient / caregiver. Prescriptions: LIBRAX 2.5-5 MG CAPS (CLIDINIUM-CHLORDIAZEPOXIDE) Take 1 tablet by mouth two times a day as needed for abdominal pain  #30 x 1   Entered by:   Hortense Ramal CMA (AAMA)   Authorized by:   Hart Carwin MD   Signed by:   Hortense Ramal CMA (AAMA) on 12/24/2009   Method used:   [facility]        [facility] [phone][facility] (retail)       [address]       [place], [place]  [address]       Ph: [phone] or [phone]       Fax: [phone]   RxID:   [id number]

## 2010-11-19 NOTE — Progress Notes (Signed)
Summary: need order faxed  Phone Note Call from Patient Call back at Home Phone [phone]   Caller: Patient--live call Summary of Call: It is time for her bone density.  [facility] fax to [phone]. Initial call taken by: Warnell Forester,  Mar 07, 2010 9:56 AM  New Problems: POSTMENOPAUSAL STATUS (ICD-V49.81)   New Problems: POSTMENOPAUSAL STATUS (ICD-V49.81)

## 2010-11-19 NOTE — Assessment & Plan Note (Signed)
Summary: CPX//CCM   Vital Signs:  Patient profile:   60 year old female Height:      65 inches Weight:      146 pounds BMI:     24.38 Pulse rate:   60 / minute Pulse rhythm:   regular Resp:     12 per minute BP sitting:   138 / 84  (left arm) Cuff size:   regular  Vitals Entered By: Gladis Riffle, RN (May 30, 2010 9:05 AM) CC: cpx, labs done--has gyn Is Patient Diabetic? No   Primary Care Provider:  Birdie Sons, MD  CC:  cpx and labs done--has gyn.  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 One Tablet Once A Day As Needed 2)  Clorazepate Dipotassium 7.5 Mg  Tabs (Clorazepate Dipotassium) .... Take One Tablet As Needed 3)  Vagifem 25 Mcg Tabs (Estradiol) .... Take One Tab Two Times A Week 4)  Zovirax 5 % Oint (Acyclovir) .... Use As Directed As Needed 5)  Lansoprazole 30 Mg Cpdr (Lansoprazole) .... Take 1 Tablet By Mouth Two Times A Day--Takes One Daily Due To Headache  Allergies: 1)  ! Sulfa 2)  ! Cefdinir (Cefdinir) 3)  Nexium  Past History:  Family History: Last updated: 12/24/2009 Family History of Alcoholism/Addiction Family History Diabetes 1st degree relative:Sister, Grandmother Family History Hypertension No FH of Colon Cancer: Family History of Heart Disease: Mother  Social History: Last updated: 12/24/2009 Married Never Smoked Alcohol use-yes Drug use-no Regular exercise-yes Daily Caffeine Use 1.5 cup coffee  Risk Factors: Exercise: yes (07/14/2007)  Risk Factors: Smoking Status: never (05/30/2010)  Past Medical History:  Allergies  Asthma GERD  Past Surgical History: Reviewed history from 12/20/2009 and no changes  required. Tonsillectomy Vein Stripping Breast Lumpectomy-benign  Family History: Reviewed history from 12/24/2009 and no changes required. Family History of Alcoholism/Addiction Family History Diabetes 1st degree relative:Sister, Grandmother Family History Hypertension No FH of Colon Cancer: Family History of Heart Disease: Mother  Social History: Reviewed history from 12/24/2009 and no changes required. Married Never Smoked Alcohol use-yes Drug use-no Regular exercise-yes Daily Caffeine Use 1.5 cup coffee  Physical Exam  General:  alert and well-developed.   Head:  normocephalic and atraumatic.   Eyes:  pupils equal and pupils round.   Ears:  R ear normal and L ear normal.   Neck:  No deformities, masses, or tenderness noted. Lungs:  Normal respiratory effort, chest expands symmetrically. Lungs are clear to auscultation, no crackles or wheezes. Heart:  normal rate and regular rhythm.   Abdomen:  soft and non-tender.   Msk:  No deformity or scoliosis noted of thoracic or lumbar spine.   Pulses:  R radial normal and L radial normal.   Extremities:  No clubbing, cyanosis, edema, or deformity noted  Neurologic:  cranial nerves II-XII intact and gait normal.   Skin:  turgor normal and color normal.   Cervical Nodes:  no anterior cervical adenopathy and no posterior cervical adenopathy.   Psych:  normally interactive and good eye contact.     Impression & Recommendations:  Problem # 1:  PHYSICAL EXAMINATION (ICD-V70.0) health maint UTD staying very active with Tennis/Etc encouraged same activity level  Problem # 2:  GERD (ICD-530.81) controlled continue current medications  Her updated medication list for this problem includes:    Lansoprazole 30 Mg Cpdr (Lansoprazole) .Marland Kitchen... Take 1 tablet by mouth two times a day--takes one daily due to headache  Complete Medication List: 1)  Valtrex 500 Mg Tabs (Valacyclovir hcl) .... One tablet once a day as needed 2)  Clorazepate  Dipotassium 7.5 Mg Tabs (Clorazepate dipotassium) .... Take one tablet as needed 3)  Vagifem 25 Mcg Tabs (Estradiol) .... Take one tab two times a week 4)  Zovirax 5 % Oint (Acyclovir) .... Use as directed as needed 5)  Lansoprazole 30 Mg Cpdr (Lansoprazole) .... Take 1 tablet by mouth two times a day--takes one daily due to headache   Immunization History:  Tetanus/Td Immunization History:    Tetanus/Td:  tdap (04/14/2008)

## 2010-11-19 NOTE — Progress Notes (Signed)
Summary: request zovirax ointment  Phone Note From Pharmacy Call back at fax [phone]   [phone] via [facility] Call For: swords  Summary of Call: ok to refill zovirax ointment.  Was last filled 2008.  If so, dose and directions please Initial call taken by: Gladis Riffle, RN,  November 14, 2009 3:49 PM  Follow-up for Phone Call        ok to refill Follow-up by: Birdie Sons MD,  November 14, 2009 7:19 PM  Additional Follow-up for Phone Call Additional follow up Details #1::        see Rx Additional Follow-up by: Gladis Riffle, RN,  November 15, 2009 8:41 AM    New/Updated Medications: ZOVIRAX 5 % OINT (ACYCLOVIR) use as directed as needed Prescriptions: ZOVIRAX 5 % OINT (ACYCLOVIR) use as directed as needed  #1 tube x 1   Entered by:   Gladis Riffle, RN   Authorized by:   Birdie Sons MD   Signed by:   Gladis Riffle, RN on 11/15/2009   Method used:   Electronically to        [facility] [phone][facility] (retail)       [address]       [place], [place]  [address]       Ph: [phone] or [phone]       Fax: [phone]   RxID:   [id number]

## 2010-11-19 NOTE — Progress Notes (Signed)
Summary: TRIAGE-Endo. scheduled, Throat is sore   Phone Note Call from Patient Call back at [facility] Phone [phone]   Call For: Dr Juanda Chance Summary of Call: Has really bad allergies-her throat is realy swollen. Concerned about going thru with Endo tomorrow morning. Has no fever and is almost sure its her allergies. Initial call taken by: Leanor Kail [facility],  January 29, 2010 8:15 AM  Follow-up for Phone Call        Writer left message on machine that I have sent Dr. Regino Schultze office nurse a flag to clarify if she wants to go ahead with procedure and will call back as soon as I have an answer Follow-up by: Karl Bales RN,  January 29, 2010 8:56 AM  Additional Follow-up for Phone Call Additional follow up Details #1::        Pt. is scheduled for an Endo. on 01-31-10 in [facility].  She is c/o allergies and a sore throat.  She should be fine for procedure, as long as she isn't infectious or running a fever. She is seeing Dr.Todd today at 4pm, she will call me with an update in the morning. Additional Follow-up by: Laureen Ochs LPN,  January 29, 2010 1:08 PM    Additional Follow-up for Phone Call Additional follow up Details #2::    Pt. will procede with Endo.,she is feeling better today.  She saw Dr.Todd yesterday, no strep. She will callback as needed. Follow-up by: Laureen Ochs LPN,  January 30, 2010 9:44 AM

## 2010-11-19 NOTE — Assessment & Plan Note (Signed)
Summary: ear pain//ccm   Vital Signs:  Patient profile:   60 year old female Temp:     98.0 degrees F oral BP sitting:   160 / 82  (left arm) Cuff size:   regular  Vitals Entered By: Sid Falcon LPN (May 20, 2010 3:29 PM)  Serial Vital Signs/Assessments:  Time      Position  BP       Pulse  Resp  Temp     By                     150/78                         Evelena Peat MD  CC: left ear pain X 4 weeks   History of Present Illness: Same-day appointment. 4 week history of left ear pain. Occasional fleeting symptoms of vertigo. Denies any fever, chills, or hearing loss. No nasal congestion. No history of seasonal allergies.  Denies ear drainage, cough, headaches.  Allergies: 1)  ! Sulfa 2)  ! Cefdinir (Cefdinir) 3)  Nexium  Past History:  Past Medical History: Last updated: 07/14/2007 Fainting Spells Allergies UTIs Asthma GERD PMH reviewed for relevance  Review of Systems      See HPI  Physical Exam  General:  Well-developed,well-nourished,in no acute distress; alert,appropriate and cooperative throughout examination Eyes:  pupils equal, pupils round, and pupils reactive to light.   Ears:  small L serous effusion.  No erythema.  Canal normal. Nose:  External nasal examination shows no deformity or inflammation. Nasal mucosa are pink and moist without lesions or exudates. Mouth:  Oral mucosa and oropharynx without lesions or exudates.  Teeth in good repair. Neck:  No deformities, masses, or tenderness noted. Lungs:  Normal respiratory effort, chest expands symmetrically. Lungs are clear to auscultation, no crackles or wheezes. Heart:  normal rate and regular rhythm.     Impression & Recommendations:  Problem # 1:  OTITIS MEDIA, SEROUS (ICD-381.4) Patient reassured no evidence for bacterial infection. Explained this usually takes time to resolve.  Complete Medication List: 1)  Valtrex 500 Mg Tabs (Valacyclovir hcl) .... One tablet once a day as  needed 2)  Clorazepate Dipotassium 7.5 Mg Tabs (Clorazepate dipotassium) .... Take one tablet as needed 3)  Vagifem 25 Mcg Tabs (Estradiol) .... Take one tab two times a week 4)  Zovirax 5 % Oint (Acyclovir) .... Use as directed as needed 5)  Librax 2.5-5 Mg Caps (Clidinium-chlordiazepoxide) .... Take 1 tablet by mouth two times a day as needed for abdominal pain 6)  Hydromet 5-1.5 Mg/67ml Syrp (Hydrocodone-homatropine) .... 1/2 to 1 tsp three times a day as needed 7)  Lansoprazole 30 Mg Cpdr (Lansoprazole) .... Take 1 tablet by mouth two times a day  Patient Instructions: 1)  Schedule CPE with Dr Cato Mulligan

## 2010-11-29 ENCOUNTER — Other Ambulatory Visit: Payer: Self-pay | Admitting: *Deleted

## 2010-11-29 MED ORDER — CLORAZEPATE DIPOTASSIUM 7.5 MG PO TABS: 7.5000 mg | ORAL_TABLET | Freq: Two times a day (BID) | ORAL | Status: AC

## 2010-12-03 ENCOUNTER — Other Ambulatory Visit: Payer: Self-pay | Admitting: *Deleted

## 2010-12-03 NOTE — Telephone Encounter (Signed)
Opened in error

## 2011-02-27 ENCOUNTER — Encounter: Payer: Self-pay | Admitting: Internal Medicine

## 2011-02-27 ENCOUNTER — Ambulatory Visit (INDEPENDENT_AMBULATORY_CARE_PROVIDER_SITE_OTHER): Payer: Commercial Managed Care - PPO | Admitting: Internal Medicine

## 2011-02-27 DIAGNOSIS — N390 Urinary tract infection, site not specified: Secondary | ICD-10-CM

## 2011-02-27 MED ORDER — CIPROFLOXACIN HCL 500 MG PO TABS: 500.0000 mg | ORAL_TABLET | Freq: Two times a day (BID) | ORAL | Status: DC

## 2011-03-02 ENCOUNTER — Encounter: Payer: Self-pay | Admitting: Internal Medicine

## 2011-03-02 DIAGNOSIS — N390 Urinary tract infection, site not specified: Secondary | ICD-10-CM | POA: Insufficient documentation

## 2011-03-02 NOTE — Progress Notes (Signed)
  Subjective:    Patient ID: [name], female    DOB: [birth year], 60 y.o.   MRN: [id number]  HPI Patient presents to clinic for evaluation of possible UTI.Notes today history of suprapubic discomfort and increasing pressure sensation. Denies back pain, nausea or vomiting. Attempted over-the-counter azo. Increased urinary frequency but no hematuria. No alleviating or exacerbating factors. Urinalysis reviewed with patient demonstrating trace blood, nitrite positive and LE +1. No other complaints  Reviewed past medical history, medications and allergies    Review of Systems  Constitutional: Negative for fever and chills.  Gastrointestinal: Negative for nausea, vomiting and abdominal pain.  Genitourinary: Negative for urgency, hematuria, flank pain, decreased urine volume and difficulty urinating.       Objective:   Physical Exam  Nursing note and vitals reviewed. Constitutional: She appears well-developed and well-nourished. No distress.  HENT:  Head: Normocephalic and atraumatic.  Right Ear: External ear normal.  Left Ear: External ear normal.  Abdominal: Soft. There is tenderness in the suprapubic area. There is no rigidity, no rebound and no guarding.  Musculoskeletal:       No CVA or flank tenderness  Neurological: She is alert.  Skin: Skin is warm and dry. She is not diaphoretic.          Assessment & Plan:

## 2011-03-02 NOTE — Assessment & Plan Note (Signed)
Begin Cipro x7 days. Followup if no improvement or worsening.

## 2011-03-04 ENCOUNTER — Telehealth: Payer: Self-pay | Admitting: *Deleted

## 2011-03-04 ENCOUNTER — Other Ambulatory Visit (INDEPENDENT_AMBULATORY_CARE_PROVIDER_SITE_OTHER): Payer: Commercial Managed Care - PPO | Admitting: Internal Medicine

## 2011-03-04 DIAGNOSIS — N390 Urinary tract infection, site not specified: Secondary | ICD-10-CM

## 2011-03-04 DIAGNOSIS — R300 Dysuria: Secondary | ICD-10-CM

## 2011-03-04 MED ORDER — SULFAMETHOXAZOLE-TRIMETHOPRIM 800-160 MG PO TABS: 1.0000 | ORAL_TABLET | Freq: Two times a day (BID) | ORAL | Status: DC

## 2011-03-04 MED ORDER — NITROFURANTOIN MONOHYD MACRO 100 MG PO CAPS: 100.0000 mg | ORAL_CAPSULE | Freq: Two times a day (BID) | ORAL | Status: AC

## 2011-03-04 NOTE — Telephone Encounter (Signed)
Swords pt

## 2011-03-04 NOTE — Telephone Encounter (Signed)
Per Dr Clent Ridges call in macrobid 100mg  1 po bid for 7 days

## 2011-03-04 NOTE — Consult Note (Signed)
VASCULAR SURGERY CONSULTATION   [name], [name]  DOB:  14-Aug-1951                                       05/30/2008  OVFIE#:[id number]   The patient was referred by Dr. Cato Mulligan for evaluation of swelling in the  left leg.  This 60 year old healthy female states that she was struck in  the left pretibial area by a softball about 6 years ago.  Over the last  few years she has had intermittent swelling in this area but no history  of deep venous thrombosis or thrombophlebitis in the left leg.  She has  no swelling in the ankle and foot but only just below the knee medially  in the pretibial area.  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  She has had no sequela from that since then.  She ambulates long  distances and is able to exercise.  She has no history of bleeding,  ulceration or thrombophlebitis in either lower extremity.  Does not wear  elastic compression stockings.  Does not elevate the legs on regular  basis.   PAST MEDICAL HISTORY:  Negative for diabetes, hypertension, coronary  artery disease, stroke, COPD.   PREVIOUS SURGERY:  1. Includes vein stripping left leg 1972.  2. Removal of the right breast mass, noncancerous in the past.   ALLERGIES:  None known.   MEDICATIONS:  Please see health history form.   SOCIAL HISTORY:  Negative tobacco and alcohol.   FAMILY HISTORY:  Positive for varicose veins in her aunt and  grandmother.  Negative for coronary disease, diabetes and stroke.   PHYSICAL EXAMINATION:  Blood pressure 130/78, heart rate is 70,  respirations 12.  Generally she is a healthy-appearing female in no  apparent distress.  Alert and oriented x3.  Neck:  Supple with 2+  carotid pulses palpable.  No bruits are audible.  Neurologic exam is  normal.  No palpable adenopathy in the neck.  Chest:   Clear to  auscultation.  Cardiovascular:  Regular rhythm.  No murmurs.  Abdomen:  Soft, nontender, no palpable masses.  She has 3+ femoral, popliteal,  dorsalis pedis pulses bilaterally.  There is no distal edema  hyperpigmentation ulceration or significant varicosities.  She has a  very mild and subtle swelling just below the knee medially in the  pretibial area on the left side which is nontender.  As noted, there is  no evidence of any venous insufficiency.  There is no other abnormality  on physical exam and this is quite subtle.  The right side does have  some fullness popliteal fossa consistent a Baker's cyst which she states  she has.   She had a venous duplex exam performed at [facility] in the in  the past year which is normal.   IMPRESSION:  No evidence of significant arterial or venous insufficiency  with some subtle swelling in the left leg ago where she had trauma years  ago.  No further evaluation necessary from a vascular standpoint.  She  will return to see [place] on a p.r.n. basis.   Quita Skye Hart [place], M.D.  Electronically Signed  JDL/MEDQ  D:  05/30/2008  T:  05/31/2008  Job:  [id number]   cc:   Valetta Mole. Swords, MD

## 2011-03-04 NOTE — Telephone Encounter (Signed)
Urine culture.  D/c cipro try septra ds bid for 5 days  Make an appt for urology

## 2011-03-04 NOTE — Telephone Encounter (Signed)
Pt allergic to Sulfa.

## 2011-03-04 NOTE — Telephone Encounter (Signed)
Pt called again and said that Cipro is not helping. Still urinating every 30 mins. Pt would like to know what she should do? Pls call asap today

## 2011-03-04 NOTE — Assessment & Plan Note (Signed)
[facility]                         GASTROENTEROLOGY OFFICE NOTE   MEDRITH, VEILLON                       MRN:          [id number]  DATE:01/17/2008                            DOB:          Sep 24, 1951    CHIEF COMPLAINT:  Followup reflux problems.   [name] had been seen by Dr. Corinda Gubler, diagnosed with gastroesophageal  reflux disease.  She did not have esophagitis findings at EGD but had  more of a gastritis picture.  She had been doing very well on Protonix  40 mg daily for a number of years.  However since moving to a generic  Protonix (pantoprazole) at 40 mg daily, she notices that she starts to  have more problems later in the day with heartburn, indigestion, and  burning epigastric pain which is her main problem.  She is not  describing dysphagia.  There is no weight loss or other constitutional  symptom complex noted.  She remains physically active.  In the past she  had a question of a ventral hernia and she cannot do sit-ups because  something pops out under her left upper quadrant, but she still manages  to exercise and do well without any problems.  She does note that spicy  foods and red wine or other alcohol would particularly bother her with a  post prandial epigastric pain in the evening.   MEDICATIONS:  1. Pantoprazole 40 mg daily.  (She is using it b.i.d. some and with      benefit).  2. Multivitamin.  3. Calcium and vitamin D.   ALLERGIES/SENSITIVITIES:  1. SULFA.  2. NEXIUM with itching and dizziness, respectively.   AS NEEDED MEDICATIONS:  Include Valtrex.   PAST MEDICAL HISTORY:  1. Normal colonoscopy for screening on May 02, 2004.  2. EGD with chronic gastritis, 2-cm hiatal hernia, and a duodenitis      reported.  H. pylori biopsy testing negative.   PAST SURGICAL HISTORY:  1. Varicose vein stripping.  2. Benign breast lumpectomy.   PHYSICAL EXAMINATION:  VITAL SIGNS:  Weight 151 pounds, pulse 68, blood  pressure 130/70.   ASSESSMENT:  Exacerbation of gastroesophageal reflux disease.  It sounds  like it is related to the change to generic Protonix.  It could be that  she is having tachyphylaxis to a medication she has taken for a long  time as well.   PLAN:  At this time, I have prescribed pantoprazole 40 mg b.i.d. to be  taken before breakfast and supper.  If that is not working, we may  consider a different PPI.  We could also consider the addition of  something like Carafate on an intermittent basis.     Iva Boop, MD,FACG  Electronically Signed    CEG/MedQ  DD: 01/17/2008  DT: 01/17/2008  Job #: [id number]

## 2011-03-04 NOTE — Telephone Encounter (Signed)
Pt is calling stating that she is feeling like her bladder has dropped, and is having tremendous pressure in there pelvis.  She is urinating every 15 min.  Wants to know if she should go to a URO. Or come back here.

## 2011-03-04 NOTE — Assessment & Plan Note (Signed)
[facility]                         GASTROENTEROLOGY OFFICE NOTE   ALAISA, MOFFITT                       MRN:          [id number]  DATE:03/03/2007                            DOB:          1951/06/04    This very nice lady comes in and says she is here for her yearly  followup.  She says all is going well.  She has occasionally had some  heartburn, but it is not often.  Patient would like a refill on her  Protonix.  She does have some mid epigastric bloating.  We talked  Probiotic sometimes.  She takes Protonix, multivitamins.  She is a  Armed forces operational officer, golfer, so on, and takes good care of herself.   PHYSICAL EXAMINATION:  VITAL SIGNS:  She weighed 151.  Blood pressure  118/58, pulse 68 and regular.  __________ unremarkable.   IMPRESSION:  Gastroesophageal reflux disease, controlled well with  Protonix.   RECOMMENDATIONS:  Refill this and have her come see one of my associates  some time in a year for followup unless she has further problems prior.     Ulyess Mort, MD  Electronically Signed    SML/MedQ  DD: 03/03/2007  DT: 03/03/2007  Job #: [id number]

## 2011-03-06 LAB — URINE CULTURE

## 2011-03-10 ENCOUNTER — Other Ambulatory Visit: Payer: Self-pay | Admitting: Certified Nurse Midwife

## 2011-03-10 DIAGNOSIS — Z1231 Encounter for screening mammogram for malignant neoplasm of breast: Secondary | ICD-10-CM

## 2011-03-11 ENCOUNTER — Telehealth: Payer: Self-pay | Admitting: Internal Medicine

## 2011-03-11 NOTE — Telephone Encounter (Signed)
Pt called to get results from her last urinalysis. Pls call today.

## 2011-03-11 NOTE — Telephone Encounter (Signed)
Urine culture was normal

## 2011-03-12 NOTE — Telephone Encounter (Signed)
Left message on cell phone

## 2011-03-20 ENCOUNTER — Ambulatory Visit (HOSPITAL_COMMUNITY)
Admission: RE | Admit: 2011-03-20 | Discharge: 2011-03-20 | Disposition: A | Discharge status: Home / Self Care | Payer: Commercial Managed Care - PPO | Source: Ambulatory Visit | Attending: Certified Nurse Midwife | Admitting: Certified Nurse Midwife

## 2011-03-20 DIAGNOSIS — Z1231 Encounter for screening mammogram for malignant neoplasm of breast: Secondary | ICD-10-CM | POA: Insufficient documentation

## 2011-04-15 ENCOUNTER — Other Ambulatory Visit: Payer: Self-pay | Admitting: Orthopedic Surgery

## 2011-04-15 DIAGNOSIS — M7120 Synovial cyst of popliteal space [Baker], unspecified knee: Secondary | ICD-10-CM

## 2011-04-16 ENCOUNTER — Ambulatory Visit
Admission: RE | Admit: 2011-04-16 | Discharge: 2011-04-16 | Disposition: A | Discharge status: Home / Self Care | Payer: Commercial Managed Care - PPO | Source: Ambulatory Visit | Attending: Orthopedic Surgery | Admitting: Orthopedic Surgery

## 2011-04-16 DIAGNOSIS — M7120 Synovial cyst of popliteal space [Baker], unspecified knee: Secondary | ICD-10-CM

## 2011-04-17 ENCOUNTER — Other Ambulatory Visit: Payer: Self-pay | Admitting: Orthopedic Surgery

## 2011-04-17 DIAGNOSIS — M7120 Synovial cyst of popliteal space [Baker], unspecified knee: Secondary | ICD-10-CM

## 2011-04-30 ENCOUNTER — Other Ambulatory Visit: Payer: Commercial Managed Care - PPO

## 2011-04-30 ENCOUNTER — Ambulatory Visit
Admission: RE | Admit: 2011-04-30 | Discharge: 2011-04-30 | Disposition: A | Discharge status: Home / Self Care | Payer: Commercial Managed Care - PPO | Source: Ambulatory Visit | Attending: Orthopedic Surgery | Admitting: Orthopedic Surgery

## 2011-04-30 DIAGNOSIS — M7120 Synovial cyst of popliteal space [Baker], unspecified knee: Secondary | ICD-10-CM

## 2011-05-28 IMAGING — MG MM DIGITAL SCREENING
4 series · 4 of 4 positions shown · non-contrast
Comparison: none

DG SCREEN MAMMOGRAM BILATERAL
Bilateral CC and MLO view(s) were taken.

DIGITAL SCREENING MAMMOGRAM WITH CAD:
The breast tissue is heterogeneously dense.  No masses or malignant type calcifications are 
identified.  Compared with prior studies.
Images were processed with CAD.

[R CC]
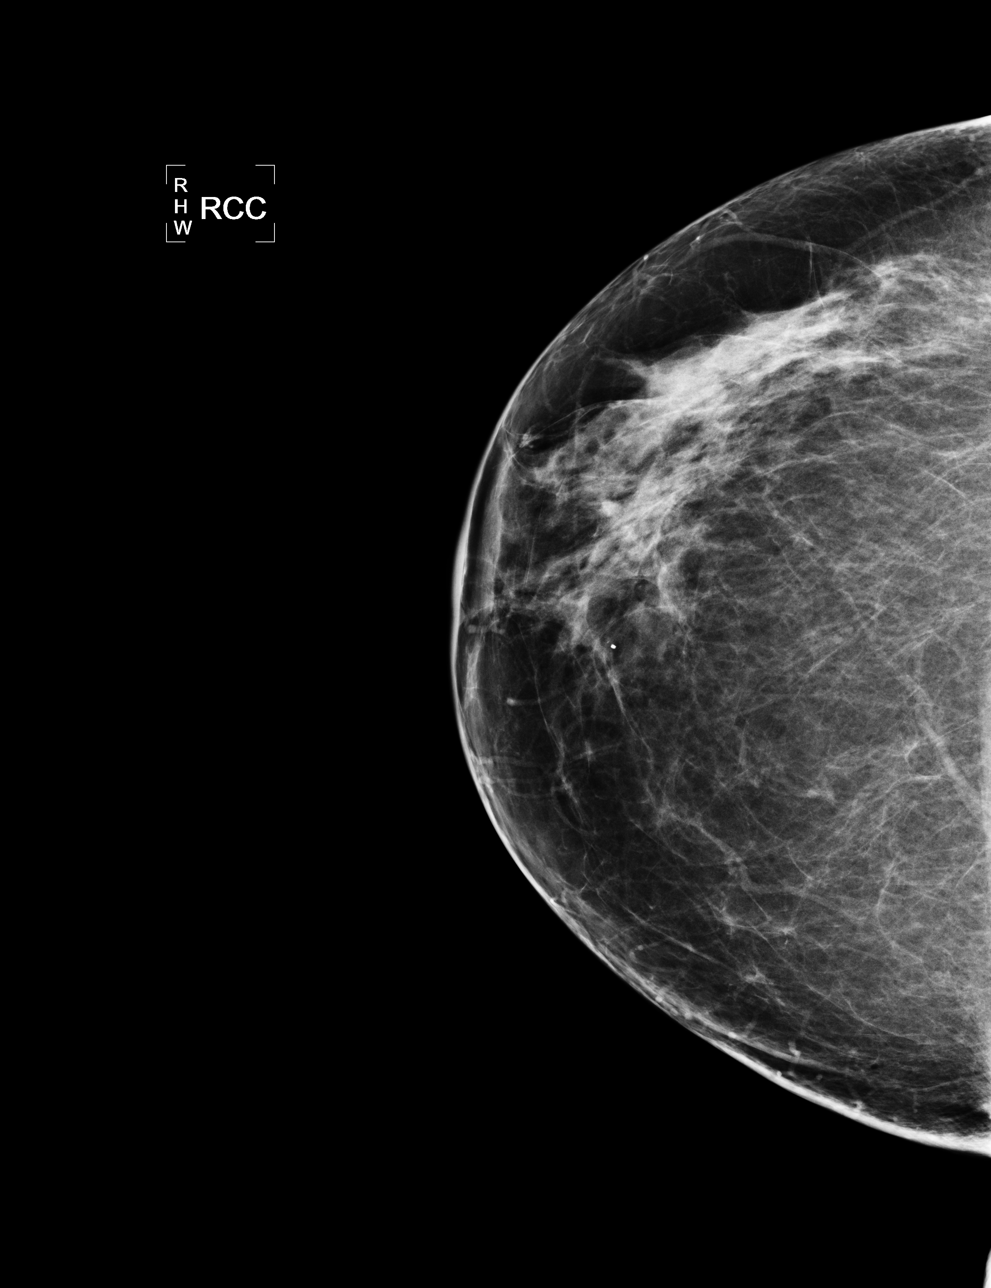

[R MLO]
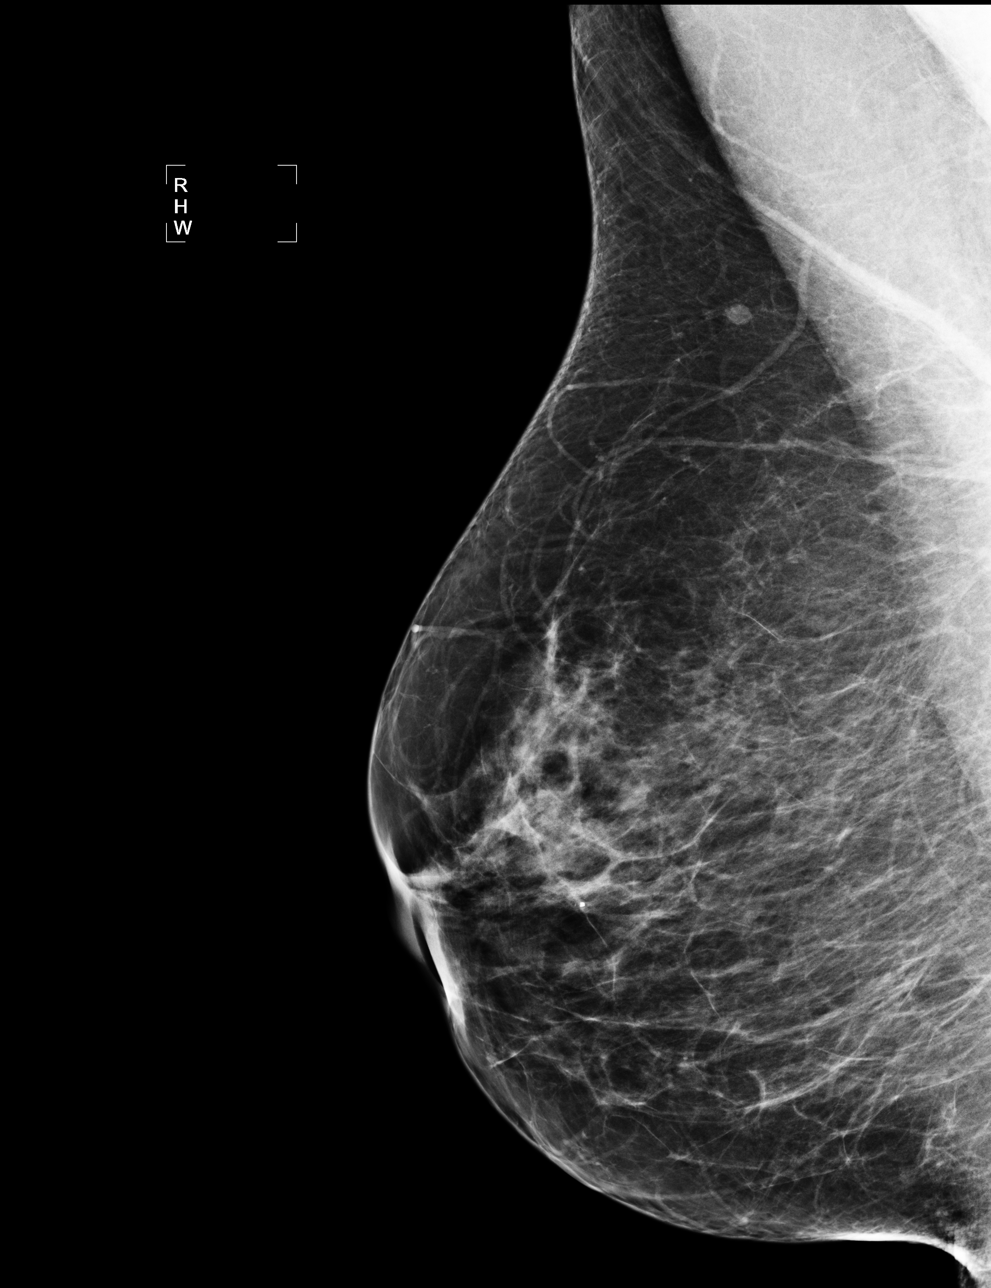

[L CC]
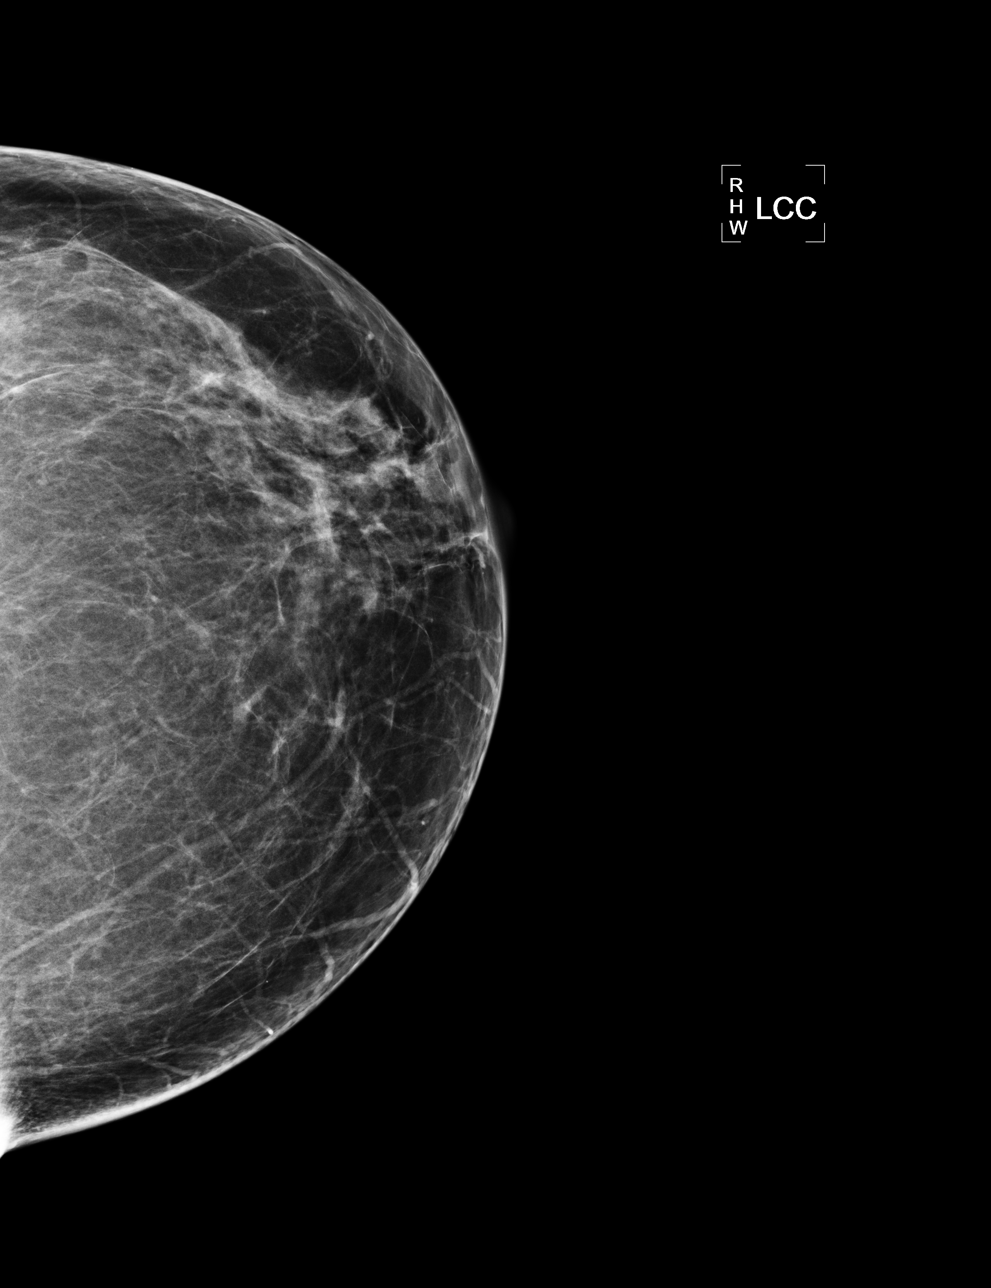

[L MLO]
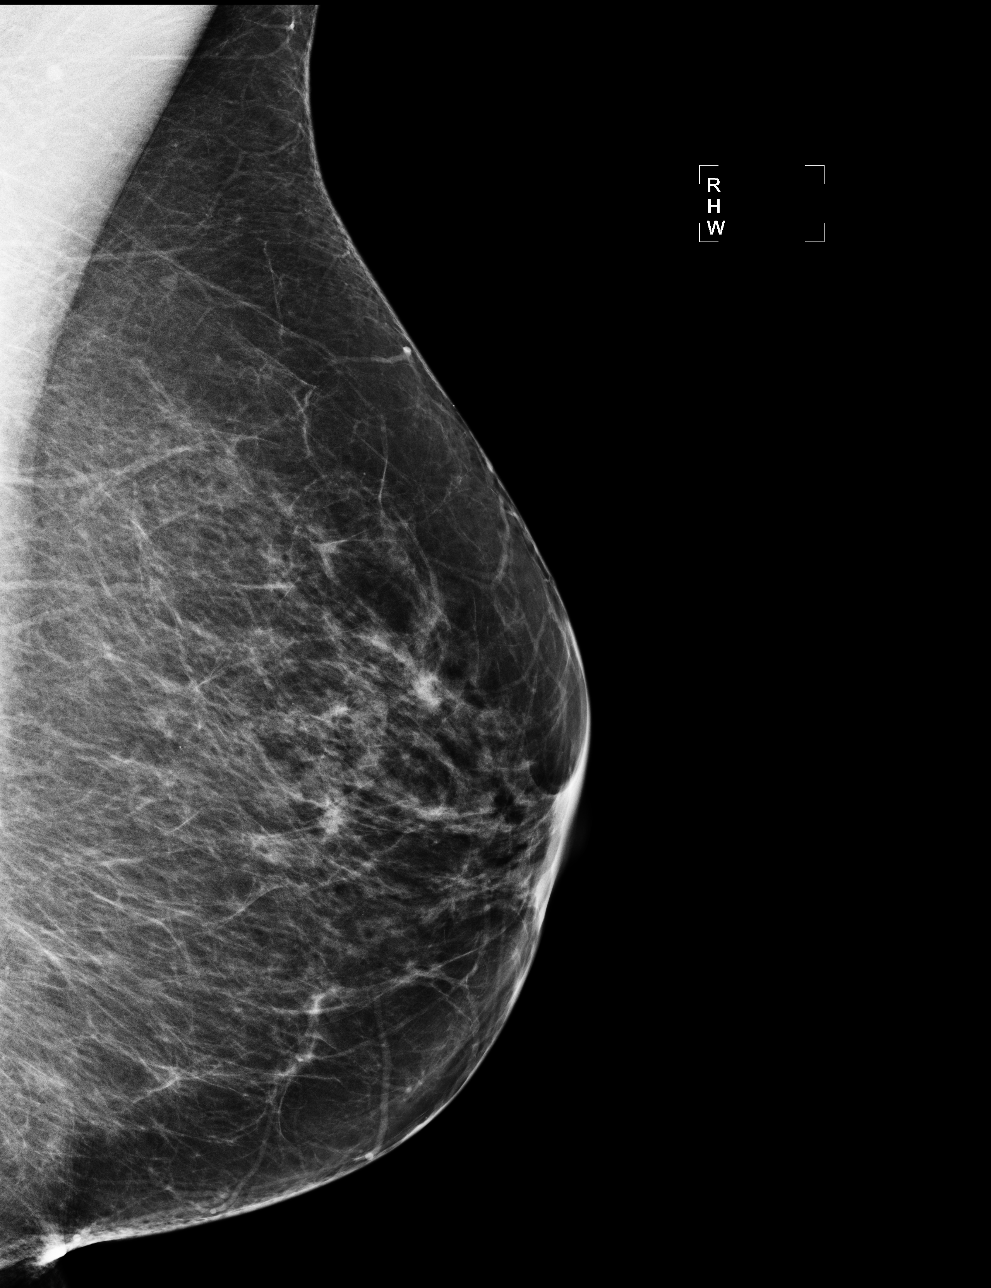

[4 of 4 positions shown; findings below may reference images not displayed]

IMPRESSION: No specific mammographic evidence of malignancy.  Next screening mammogram is recommended in one 
year.

A result letter of this screening mammogram will be mailed directly to the patient.

ASSESSMENT: Negative - BI-RADS 1

Screening mammogram in 1 year.
,

## 2011-06-30 ENCOUNTER — Encounter: Payer: Self-pay | Admitting: Internal Medicine

## 2011-07-08 ENCOUNTER — Encounter: Payer: Self-pay | Admitting: Internal Medicine

## 2011-07-31 ENCOUNTER — Encounter: Payer: Self-pay | Admitting: Internal Medicine

## 2011-07-31 ENCOUNTER — Ambulatory Visit (AMBULATORY_SURGERY_CENTER): Payer: Commercial Managed Care - PPO | Admitting: *Deleted

## 2011-07-31 VITALS — Ht 65.0 in | Wt 148.1 lb

## 2011-07-31 DIAGNOSIS — Z1211 Encounter for screening for malignant neoplasm of colon: Secondary | ICD-10-CM

## 2011-07-31 MED ORDER — PEG-KCL-NACL-NASULF-NA ASC-C 100 G PO SOLR: ORAL | Status: DC

## 2011-08-08 ENCOUNTER — Telehealth: Payer: Self-pay | Admitting: Internal Medicine

## 2011-08-08 NOTE — Telephone Encounter (Signed)
Patient is concerned because she has been scheduled for a 7 year colon recall and her insurance may not pay for this.  She had a colonoscopy in 2005 with Dr. Corinda Gubler that was normal. On the last dictation by Dr. Juanda Chance on 12/24/2009 it states she is due for colonoscopy in July 2015. Patient wants to be sure which date she is due for the colonoscopy. Please, advise.

## 2011-08-10 NOTE — Telephone Encounter (Signed)
Recall colon 04/2014

## 2011-08-11 NOTE — Telephone Encounter (Signed)
Cancelled appointment for this week and new recall put in [facility]

## 2011-08-14 ENCOUNTER — Other Ambulatory Visit: Payer: Commercial Managed Care - PPO | Admitting: Internal Medicine

## 2011-08-19 ENCOUNTER — Ambulatory Visit (INDEPENDENT_AMBULATORY_CARE_PROVIDER_SITE_OTHER): Payer: Commercial Managed Care - PPO

## 2011-08-19 DIAGNOSIS — Z23 Encounter for immunization: Secondary | ICD-10-CM

## 2011-09-30 ENCOUNTER — Other Ambulatory Visit (INDEPENDENT_AMBULATORY_CARE_PROVIDER_SITE_OTHER): Payer: Commercial Managed Care - PPO

## 2011-09-30 DIAGNOSIS — Z0000 Encounter for general adult medical examination without abnormal findings: Secondary | ICD-10-CM

## 2011-09-30 LAB — CBC WITH DIFFERENTIAL/PLATELET
Basophils Absolute: 0 10*3/uL (ref 0.0–0.1)
Eosinophils Absolute: 0.1 10*3/uL (ref 0.0–0.7)
HCT: 40.5 % (ref 36.0–46.0)
Hemoglobin: 13.8 g/dL (ref 12.0–15.0)
Lymphs Abs: 2.5 10*3/uL (ref 0.7–4.0)
MCHC: 34.1 g/dL (ref 30.0–36.0)
Monocytes Absolute: 0.7 10*3/uL (ref 0.1–1.0)
Neutro Abs: 2.6 10*3/uL (ref 1.4–7.7)
RDW: 12.6 % (ref 11.5–14.6)

## 2011-09-30 LAB — TSH: TSH: 1.43 u[IU]/mL (ref 0.35–5.50)

## 2011-09-30 LAB — LIPID PANEL
HDL: 64.9 mg/dL (ref >39.00)
Triglycerides: 193 mg/dL — ABNORMAL HIGH (ref 0.0–149.0)

## 2011-09-30 LAB — BASIC METABOLIC PANEL
CO2: 28 mEq/L (ref 19–32)
Calcium: 9.4 mg/dL (ref 8.4–10.5)
Glucose, Bld: 111 mg/dL — ABNORMAL HIGH (ref 70–99)
Potassium: 4.5 mEq/L (ref 3.5–5.1)
Sodium: 139 mEq/L (ref 135–145)

## 2011-09-30 LAB — HEPATIC FUNCTION PANEL: Albumin: 4.2 g/dL (ref 3.5–5.2)

## 2011-10-07 ENCOUNTER — Encounter: Payer: Self-pay | Admitting: Internal Medicine

## 2011-10-07 ENCOUNTER — Ambulatory Visit (INDEPENDENT_AMBULATORY_CARE_PROVIDER_SITE_OTHER): Payer: Commercial Managed Care - PPO | Admitting: Internal Medicine

## 2011-10-07 VITALS — BP 142/82 | HR 60 | Temp 98.4°F | Ht 65.0 in | Wt 148.0 lb

## 2011-10-07 DIAGNOSIS — Z0000 Encounter for general adult medical examination without abnormal findings: Secondary | ICD-10-CM

## 2011-10-07 DIAGNOSIS — Z23 Encounter for immunization: Secondary | ICD-10-CM

## 2011-10-07 NOTE — Progress Notes (Signed)
Patient ID: [name], female   DOB: December 12, 1950, 60 y.o.   MRN: [id number] CPX  Past Medical History  Diagnosis Date  . Seasonal allergies   . GERD (gastroesophageal reflux disease)     History   Social History  . Marital Status: Married    Spouse Name: N/A    Number of Children: N/A  . Years of Education: N/A   Occupational History  . Not on file.   Social History Main Topics  . Smoking status: Never Smoker   . Smokeless tobacco: Not on file  . Alcohol Use: 8.4 oz/week    14 Glasses of wine per week  . Drug Use: No  . Sexually Active: Not on file   Other Topics Concern  . Not on file   Social History Narrative  . No narrative on file    Past Surgical History  Procedure Date  . Breast biopsy     left    Family History  Problem Relation Age of Onset  . Colon cancer Neg Hx   . Stomach cancer Neg Hx     Allergies  Allergen Reactions  . Cefdinir     REACTION: rash  . Esomeprazole Magnesium     REACTION: nausea  . Sulfonamide Derivatives     REACTION: hives    Current Outpatient Prescriptions on File Prior to Visit  Medication Sig Dispense Refill  . valACYclovir (VALTREX) 500 MG tablet Take 500 mg by mouth 2 (two) times daily as needed.           patient denies chest pain, shortness of breath, orthopnea. Denies lower extremity edema, abdominal pain, change in appetite, change in bowel movements. Patient denies rashes, musculoskeletal complaints. No other specific complaints in a complete review of systems.   BP 142/82  Pulse 60  Temp(Src) 98.4 F (36.9 C) (Oral)  Ht 5\' 5"  (1.651 m)  Wt 148 lb (67.132 kg)  BMI 24.63 kg/m2  Well-developed well-nourished female in no acute distress. HEENT exam atraumatic, normocephalic, extraocular muscles are intact. Neck is supple. No jugular venous distention no thyromegaly. Chest clear to auscultation without increased work of breathing. Cardiac exam S1 and S2 are regular. Abdominal exam active bowel sounds, soft,  nontender. Extremities no edema. Neurologic exam she is alert without any motor sensory deficits. Gait is normal.  A/P: well visit - health maint UTD

## 2011-10-31 ENCOUNTER — Ambulatory Visit (INDEPENDENT_AMBULATORY_CARE_PROVIDER_SITE_OTHER): Payer: Commercial Managed Care - PPO | Admitting: Surgery

## 2011-10-31 ENCOUNTER — Encounter (INDEPENDENT_AMBULATORY_CARE_PROVIDER_SITE_OTHER): Payer: Self-pay | Admitting: Surgery

## 2011-10-31 VITALS — BP 128/84 | HR 64 | Temp 97.8°F | Resp 12 | Ht 65.0 in | Wt 148.8 lb

## 2011-10-31 DIAGNOSIS — D1720 Benign lipomatous neoplasm of skin and subcutaneous tissue of unspecified limb: Secondary | ICD-10-CM | POA: Insufficient documentation

## 2011-10-31 DIAGNOSIS — D1779 Benign lipomatous neoplasm of other sites: Secondary | ICD-10-CM

## 2011-10-31 NOTE — Progress Notes (Signed)
  CC: Lipoma HPI: The patient comes in from her primary care physician's office, Dr. Birdie Sons because of a small lipoma on the back of the right knee.  The patient notes that she has had a Baker cyst there. She is very active and plays a lot of tennis. She has had a cyst aspirated a few times and the last being in July. At that time they told her she had a lipoma in the area. She comes in now for evaluation. She's not having any other new symptoms. ROS: She has filled out our review of systems was performed and it is completely negative.  MEDS: Current Outpatient Prescriptions  Medication Sig Dispense Refill  . amoxicillin (AMOXIL) 500 MG capsule BID times 48H.      . Cimetidine (ACID REDUCER PO) Take 1 tablet by mouth as needed.       . clorazepate (TRANXENE) 7.5 MG tablet Ad lib.      Marland Kitchen conjugated estrogens (PREMARIN) vaginal cream Place 1 g vaginally daily.        . valACYclovir (VALTREX) 500 MG tablet Take 500 mg by mouth 2 (two) times daily as needed.        . Vitamin D, Ergocalciferol, (DRISDOL) 50000 UNITS CAPS Take 1 tablet by mouth Once a week.         ALLERGIES: Allergies  Allergen Reactions  . Cefdinir     REACTION: rash  . Esomeprazole Magnesium     REACTION: nausea  . Sulfonamide Derivatives     REACTION: hives       PE General: The patient alert oriented and healthy-appearing Extremities: She has bilateral superficial varicosities noted. On the posterior lateral aspect of her right knee is a soft well-circumscribed mobile mass that measures 1.5 cm. There is a question of a cystic mass in the more medial aspect of the posterior knee  Data Reviewed I reviewed theprimarycareoffice.IhavealsoreviewedherultrasoundreportwhenshehadtheaspirationofherBaker'scystthesummer.Theynotedasmallsolidmassconsistentwithalipoma.  Assessment Asymptomatic lipoma posterior right knee  Plan I told this could be removed easily under local anesthesia but since it is a lipoma and  benign and asymptomatic I recommended that she not have a surgical excision at this time. If it becomes larger more symptomatic then this to be reevaluated.

## 2011-12-11 ENCOUNTER — Other Ambulatory Visit: Payer: Self-pay | Admitting: Dermatology

## 2012-02-18 ENCOUNTER — Ambulatory Visit: Payer: Commercial Managed Care - PPO | Admitting: *Deleted

## 2012-03-22 ENCOUNTER — Other Ambulatory Visit: Payer: Self-pay | Admitting: Internal Medicine

## 2012-03-22 DIAGNOSIS — Z1231 Encounter for screening mammogram for malignant neoplasm of breast: Secondary | ICD-10-CM

## 2012-03-24 ENCOUNTER — Ambulatory Visit (HOSPITAL_COMMUNITY)
Admission: RE | Admit: 2012-03-24 | Discharge: 2012-03-24 | Disposition: A | Discharge status: Home / Self Care | Payer: Commercial Managed Care - PPO | Source: Ambulatory Visit | Attending: Internal Medicine | Admitting: Internal Medicine

## 2012-03-24 DIAGNOSIS — Z1231 Encounter for screening mammogram for malignant neoplasm of breast: Secondary | ICD-10-CM | POA: Insufficient documentation

## 2012-05-19 ENCOUNTER — Other Ambulatory Visit: Payer: Self-pay | Admitting: Internal Medicine

## 2012-05-25 ENCOUNTER — Encounter: Payer: Self-pay | Admitting: *Deleted

## 2012-05-26 ENCOUNTER — Ambulatory Visit (INDEPENDENT_AMBULATORY_CARE_PROVIDER_SITE_OTHER): Payer: Commercial Managed Care - PPO | Admitting: *Deleted

## 2012-05-26 DIAGNOSIS — I781 Nevus, non-neoplastic: Secondary | ICD-10-CM

## 2012-05-26 NOTE — Progress Notes (Signed)
X=>3% Sotradecol administered with a 27g butterfly.  Patient received a total of 6cc.  Treated all areas of concern. Easy access. Anticipate good results for this nice lady. Follow prn.  Photos: yes  Compression stockings applied: yes

## 2012-05-27 ENCOUNTER — Other Ambulatory Visit: Payer: Self-pay | Admitting: *Deleted

## 2012-05-27 MED ORDER — CLORAZEPATE DIPOTASSIUM 7.5 MG PO TABS: 7.5000 mg | ORAL_TABLET | Freq: Every day | ORAL | Status: DC | PRN

## 2012-06-24 ENCOUNTER — Ambulatory Visit (INDEPENDENT_AMBULATORY_CARE_PROVIDER_SITE_OTHER): Payer: Commercial Managed Care - PPO | Admitting: Internal Medicine

## 2012-06-24 ENCOUNTER — Encounter: Payer: Self-pay | Admitting: Internal Medicine

## 2012-06-24 VITALS — Temp 98.6°F | Wt 148.0 lb

## 2012-06-24 DIAGNOSIS — J029 Acute pharyngitis, unspecified: Secondary | ICD-10-CM

## 2012-06-24 DIAGNOSIS — H6590 Unspecified nonsuppurative otitis media, unspecified ear: Secondary | ICD-10-CM | POA: Insufficient documentation

## 2012-06-24 LAB — POCT RAPID STREP A (OFFICE): Rapid Strep A Screen: NEGATIVE

## 2012-06-24 MED ORDER — AMOXICILLIN 875 MG PO TABS: 875.0000 mg | ORAL_TABLET | Freq: Two times a day (BID) | ORAL | Status: AC

## 2012-06-24 MED ORDER — MOMETASONE FUROATE 50 MCG/ACT NA SUSP: 2.0000 | Freq: Every day | NASAL | Status: DC

## 2012-06-24 NOTE — Assessment & Plan Note (Signed)
61 year old white female with signs and symptoms of possible left serous otitis media. Treat with amoxicillin 875 mg twice daily for 7 days. Also use Nasonex 2 sprays each nostril daily as directed.  Patient advised to call office if symptoms persist or worsen.

## 2012-06-24 NOTE — Patient Instructions (Addendum)
Please call our office if your symptoms do not improve or gets worse.  

## 2012-06-24 NOTE — Progress Notes (Signed)
  Subjective:    Patient ID: [name], female    DOB: 1951/08/23, 61 y.o.   MRN: [id number]  HPI  61 year old white female complains of sore throat and earache. Patient reports intermittent left ear discomfort x1 month. She has history of similar symptoms in the past. She has used decongestants without any significant relief. Over the last 48 hours patient has also developed bad sore throat. She gargled with warm salt water without any improvement.  She denies fever or chills. Left upper side of neck is somewhat tender. "She feels she has water inside her ear"   Review of Systems No fever or chills, mild dizziness  Past Medical History  Diagnosis Date  . Seasonal allergies   . GERD (gastroesophageal reflux disease)   . Asthma     as a child    History   Social History  . Marital Status: Married    Spouse Name: N/A    Number of Children: N/A  . Years of Education: N/A   Occupational History  . Not on file.   Social History Main Topics  . Smoking status: Never Smoker   . Smokeless tobacco: Not on file  . Alcohol Use: 8.4 oz/week    14 Glasses of wine per week  . Drug Use: No  . Sexually Active: Not on file   Other Topics Concern  . Not on file   Social History Narrative  . No narrative on file    Past Surgical History  Procedure Date  . Breast biopsy     left  . Breast lumpectomy     right breast- benign    Family History  Problem Relation Age of Onset  . Colon cancer Neg Hx   . Stomach cancer Neg Hx   . Cancer Mother     lymphoma  . Heart disease Mother     Allergies  Allergen Reactions  . Cefdinir     REACTION: rash  . Esomeprazole Magnesium     REACTION: nausea  . Sulfonamide Derivatives     REACTION: hives    Current Outpatient Prescriptions on File Prior to Visit  Medication Sig Dispense Refill  . clorazepate (TRANXENE) 7.5 MG tablet Take 1 tablet (7.5 mg total) by mouth daily as needed.  30 tablet  2  . conjugated estrogens  (PREMARIN) vaginal cream Place 1 g vaginally daily.        . valACYclovir (VALTREX) 500 MG tablet TAKE 1 TABLET BY MOUTH TWICE A DAY AS NEEDED  30 tablet  2  . Vitamin D, Ergocalciferol, (DRISDOL) 50000 UNITS CAPS Take 1 tablet by mouth Once a week.      . mometasone (NASONEX) 50 MCG/ACT nasal spray Place 2 sprays into the nose daily.  17 g  3    Temp 98.6 F (37 C) (Oral)  Wt 148 lb (67.132 kg)       Objective:   Physical Exam  Constitutional: She appears well-developed and well-nourished.  HENT:  Head: Normocephalic and atraumatic.       Right and left tympanic membrane slightly retracted, no erythema  Neck:       Mild left upper neck tenderness  Cardiovascular: Normal rate, regular rhythm and normal heart sounds.   Pulmonary/Chest: Effort normal and breath sounds normal. She has no wheezes.  Lymphadenopathy:    She has no cervical adenopathy.          Assessment & Plan:

## 2012-06-29 ENCOUNTER — Other Ambulatory Visit: Payer: Self-pay | Admitting: Orthopedic Surgery

## 2012-06-29 DIAGNOSIS — M7120 Synovial cyst of popliteal space [Baker], unspecified knee: Secondary | ICD-10-CM

## 2012-06-30 ENCOUNTER — Ambulatory Visit
Admission: RE | Admit: 2012-06-30 | Discharge: 2012-06-30 | Disposition: A | Discharge status: Home / Self Care | Payer: Commercial Managed Care - PPO | Source: Ambulatory Visit | Attending: Orthopedic Surgery | Admitting: Orthopedic Surgery

## 2012-06-30 DIAGNOSIS — M7120 Synovial cyst of popliteal space [Baker], unspecified knee: Secondary | ICD-10-CM

## 2012-06-30 IMAGING — US US ASPIRATION
1 series · 12 of 12 positions shown · non-contrast
Comparison: none

CLINICAL HISTORY: Recurrent right knee Baker's cyst.

[Series 1: us aspiration · 0.06mm/px · 12 acquisitions, 12 frames shown]
[im 1/12]
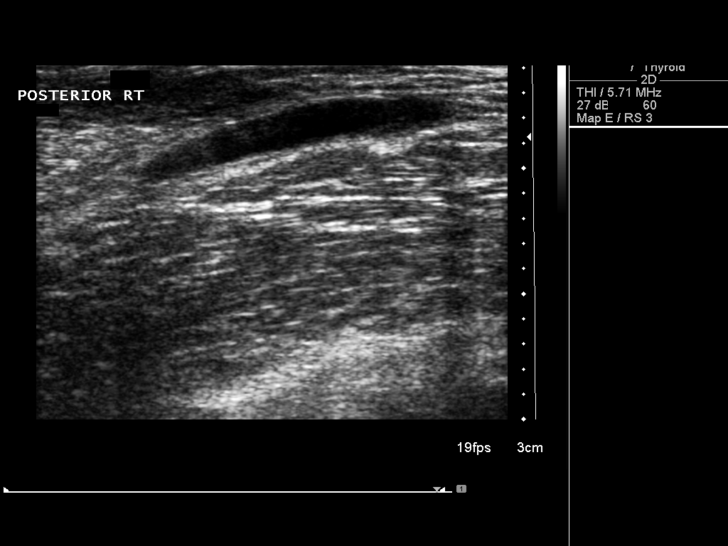
[im 2/12]
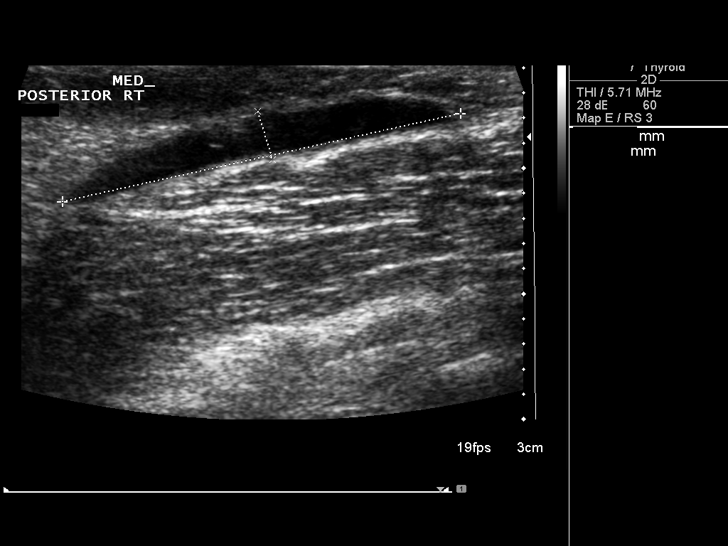
[im 3/12]
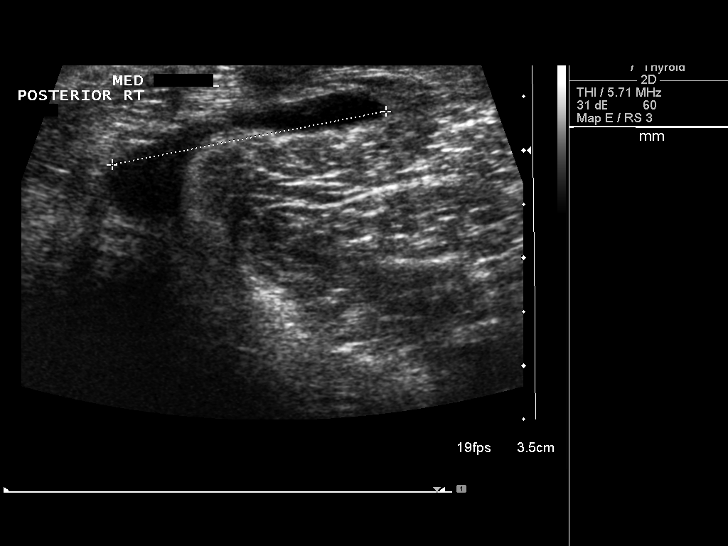
[im 4/12]
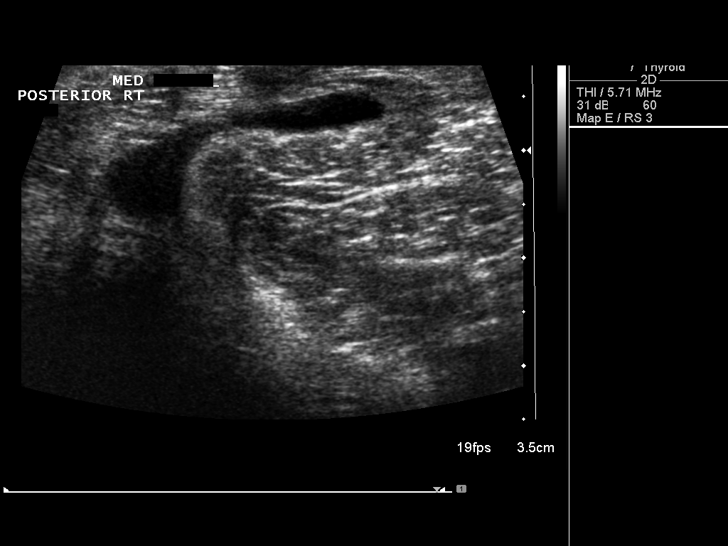
[im 5/12]
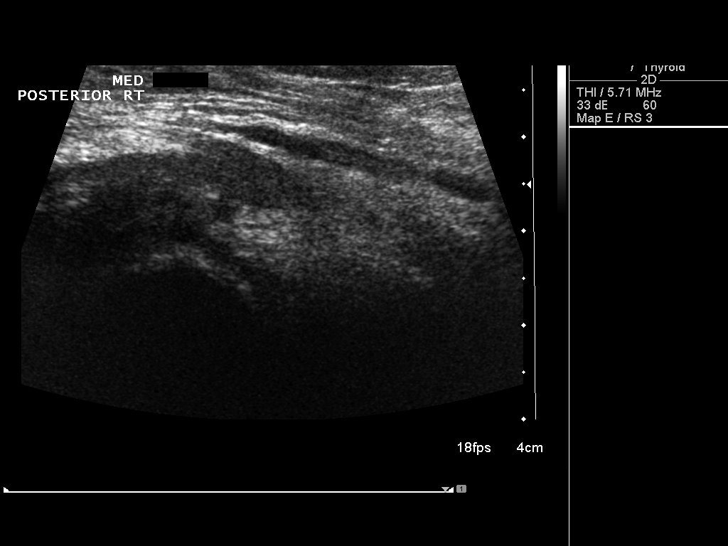
[im 6/12]
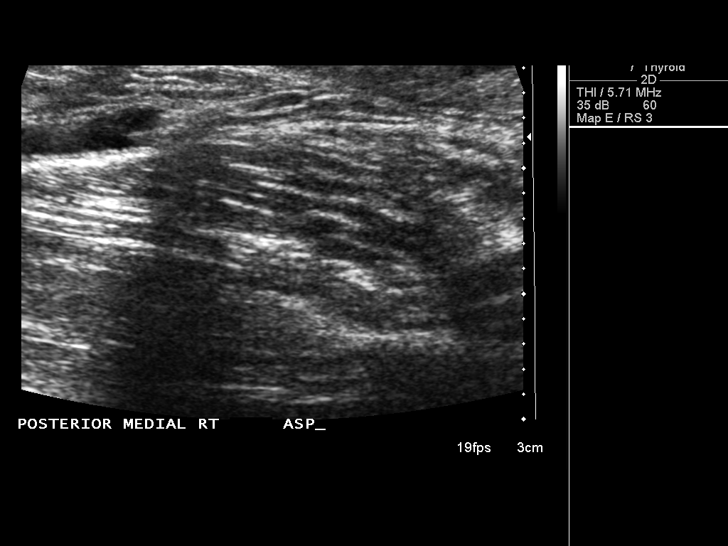
[im 7/12]
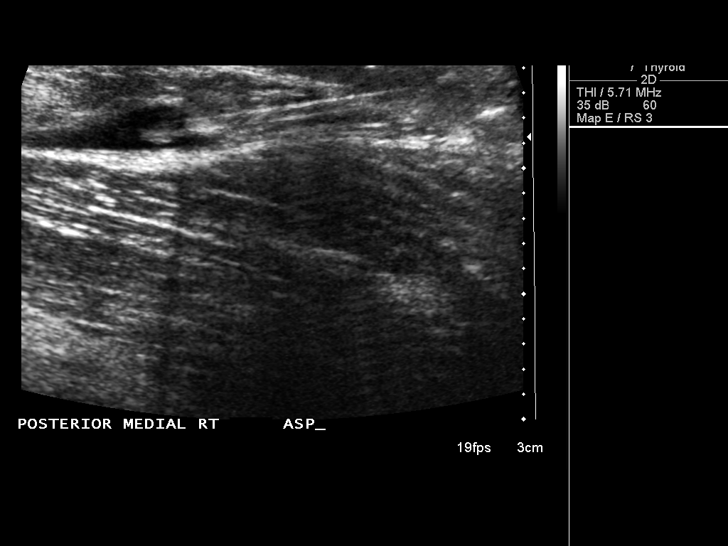
[im 8/12]
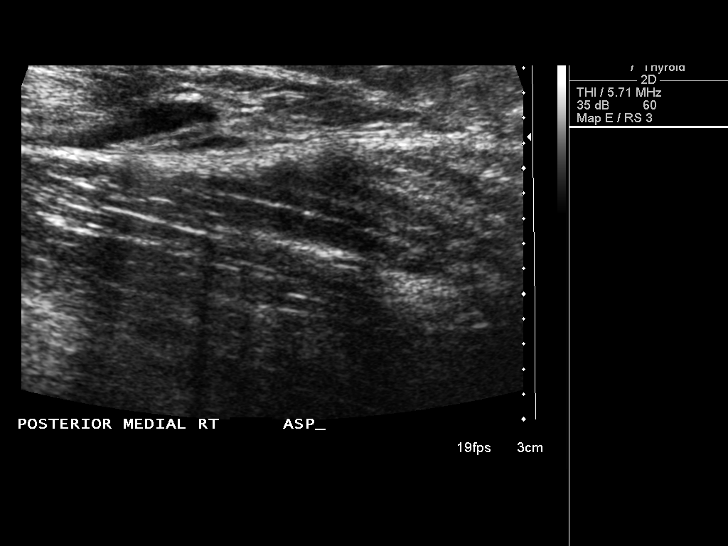
[im 9/12]
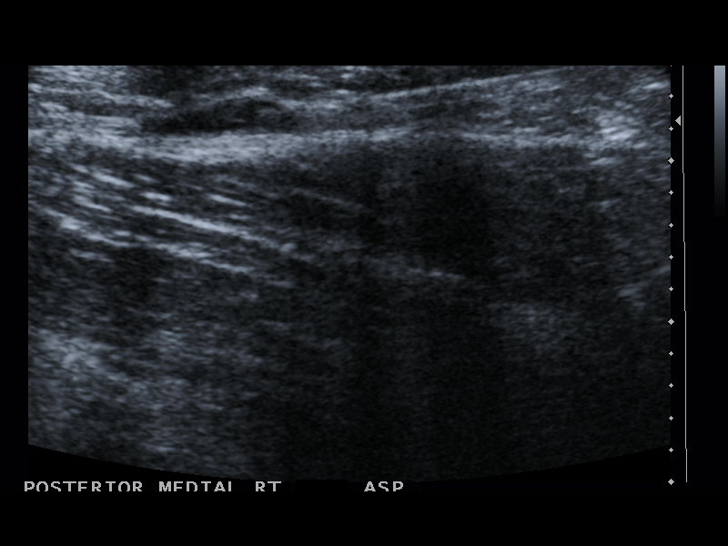
[im 10/12]
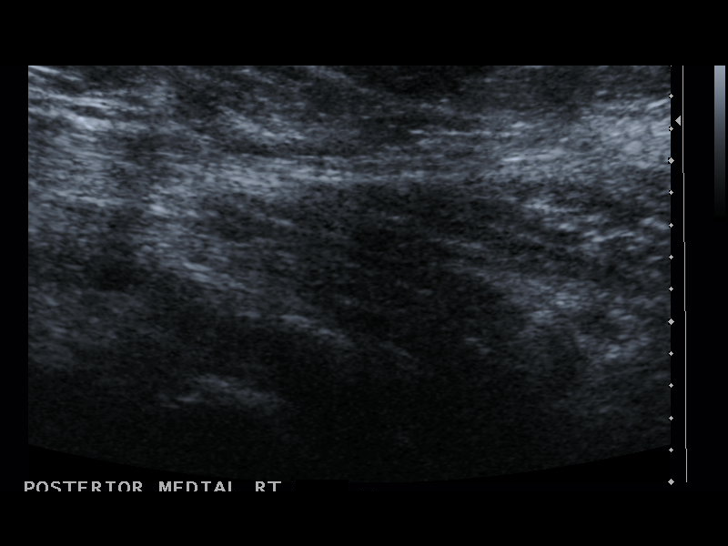
[im 11/12]
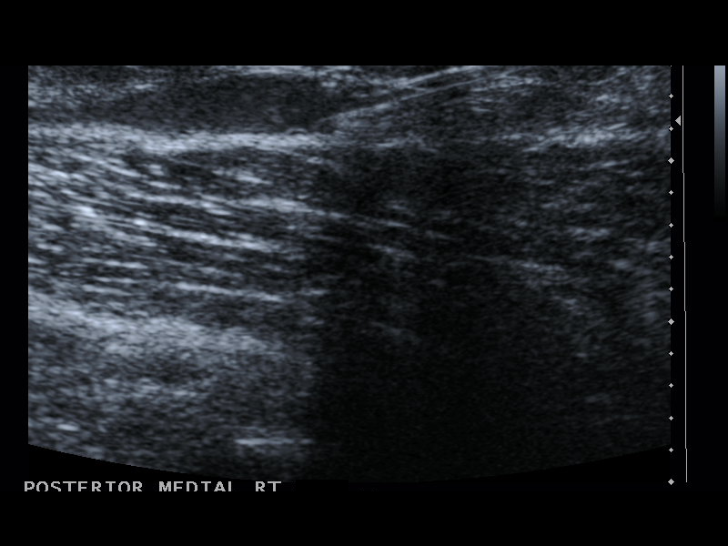
[im 12/12]
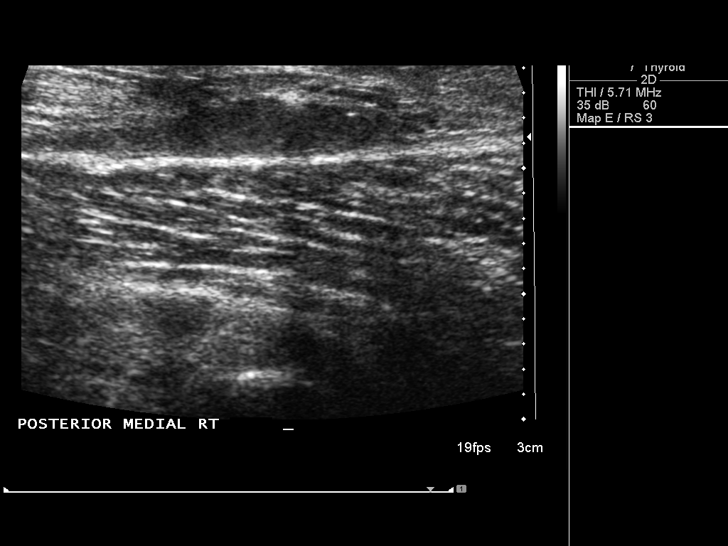

[12 of 12 positions shown; findings below may reference images not displayed]

PROCEDURE(S): ASPIRATION AND INJECTION OF RIGHT KNEE BAKER'S CYST.

Medications:Depo-Medrol 120 mg

Moderate sedation time:None

Fluoroscopy time: None

Procedure:Informed consent was obtained for a right knee aspiration
and steroid injection.  The patient was placed prone.  Ultrasound
demonstrated a small Baker's cyst along the medial aspect of the
popliteal fossa.  The leg was prepped and draped in a sterile
fashion.  Skin was anesthetized with lidocaine.  18 gauge needle
was directed into the Baker's cyst with ultrasound guidance.  2 ml
of yellow fluid was removed.  120 mg of Depo-Medrol was then
injected into the cyst.  Needle was removed without complication.
FINDINGS: Small cyst along the medial aspect of the right popliteal
fossa.  Findings are compatible with a Baker's cyst. The entire
cyst was aspirated prior to steroid injection.

Complications: None
IMPRESSION: Ultrasound guided aspiration and steroid injection of
right knee Baker's cyst.

## 2012-07-01 ENCOUNTER — Ambulatory Visit
Admission: RE | Admit: 2012-07-01 | Discharge: 2012-07-01 | Disposition: A | Discharge status: Home / Self Care | Payer: Commercial Managed Care - PPO | Source: Ambulatory Visit | Attending: Interventional Radiology | Admitting: Interventional Radiology

## 2012-07-01 ENCOUNTER — Other Ambulatory Visit: Payer: Self-pay | Admitting: Interventional Radiology

## 2012-07-01 VITALS — BP 166/75 | HR 89 | Temp 98.0°F | Resp 15 | Ht 65.0 in | Wt 148.0 lb

## 2012-07-01 DIAGNOSIS — M25561 Pain in right knee: Secondary | ICD-10-CM

## 2012-07-01 NOTE — Progress Notes (Signed)
S/p Right knee Baker's cyst aspiration & injection on 06-30-2012.  Pt c/o Right knee pain pain & swelling post injection.    Afebrile.  Skin intact at injection site.  No redness noted.  No signs of ulceration.  Pt states that she has taken OTC antiinflammatories & has applied ice to site.    Seen by Dr Fredia Sorrow.    Pt instructed to Rx with application of ice, OTC antiinflammatories, elevation, & compression as needed.  Also instructed to call for additional concerns or questions.

## 2012-07-01 NOTE — Progress Notes (Signed)
Patient ID: [name], female   DOB: Jan 30, 1951, 61 y.o.   MRN: [id number]  ESTABLISHED PATIENT OFFICE VISIT  Chief Complaint: Right knee pain and swelling after Baker's cyst aspiration and injection yesterday.  History: [name] called to be seen and evaluated today after right Baker's cyst aspiration and injection yesterday. At that time, 2 ml of fluid was removed and 120 mg of Depo-Medrol injected into the cyst cavity. The patient states that last night, she had significant right knee pain. Today, the pain is improved after elevation and application of ice as well as heat. She also took one Aleve tablet today. However, the patient states that the knee is persistently swollen and feels tight. She is able to ambulate without difficulty. She denies fever or chills. She has noticed no erythema. The patient has had two previous Baker's cyst aspiration procedures in 2010 and a third procedure in 2012 without complication.  Exam: Minimal edema/fullness surrounding the right knee. Protrusion posteriorly in the popliteal region from a known lipoma. No evidence of focal tenderness, erythema or hematoma. No ecchymosis, rash or ulceration present. The right knee demonstrates full range of motion.  Assessment and Plan: I told the patient that symptoms likely reflect an inflammatory response after the injection procedure. There is no evidence of infectious or bleeding complication. I recommended continued application of ice, leg elevation and use of nonsteroidal anti-inflammatory medications as needed. The patient is currently on a course of amoxicillin to treat an inner ear infection. I told the patient to contact us should symptoms worsen or not resolve.

## 2012-07-12 ENCOUNTER — Encounter: Payer: Self-pay | Admitting: Internal Medicine

## 2012-07-13 ENCOUNTER — Ambulatory Visit (INDEPENDENT_AMBULATORY_CARE_PROVIDER_SITE_OTHER): Payer: Commercial Managed Care - PPO

## 2012-07-13 DIAGNOSIS — Z23 Encounter for immunization: Secondary | ICD-10-CM

## 2013-01-18 ENCOUNTER — Ambulatory Visit: Payer: Commercial Managed Care - PPO | Admitting: Family

## 2013-02-15 ENCOUNTER — Encounter: Payer: Self-pay | Admitting: *Deleted

## 2013-02-16 ENCOUNTER — Encounter: Payer: Self-pay | Admitting: Vascular Surgery

## 2013-02-16 ENCOUNTER — Ambulatory Visit (INDEPENDENT_AMBULATORY_CARE_PROVIDER_SITE_OTHER): Payer: BC Managed Care – PPO | Admitting: *Deleted

## 2013-02-16 DIAGNOSIS — I781 Nevus, non-neoplastic: Secondary | ICD-10-CM

## 2013-02-16 NOTE — Progress Notes (Signed)
X=.3% Sotradecol administered with a 27g butterfly.  Patient received a total of 6cc.   Treated all I could with one syringe. Main area of discomfort: back of R knee. Tol well. Easy access. Follow prn.   Photos: yes  Compression stockings applied: yes with ace on R knee

## 2013-03-09 ENCOUNTER — Ambulatory Visit (INDEPENDENT_AMBULATORY_CARE_PROVIDER_SITE_OTHER): Payer: BC Managed Care – PPO | Admitting: *Deleted

## 2013-03-09 DIAGNOSIS — I781 Nevus, non-neoplastic: Secondary | ICD-10-CM

## 2013-04-15 ENCOUNTER — Other Ambulatory Visit: Payer: Self-pay | Admitting: Certified Nurse Midwife

## 2013-04-15 DIAGNOSIS — Z1231 Encounter for screening mammogram for malignant neoplasm of breast: Secondary | ICD-10-CM

## 2013-04-25 ENCOUNTER — Ambulatory Visit (HOSPITAL_COMMUNITY)
Admission: RE | Admit: 2013-04-25 | Discharge: 2013-04-25 | Disposition: A | Discharge status: Home / Self Care | Payer: BC Managed Care – PPO | Source: Ambulatory Visit | Attending: Certified Nurse Midwife | Admitting: Certified Nurse Midwife

## 2013-04-25 DIAGNOSIS — Z1231 Encounter for screening mammogram for malignant neoplasm of breast: Secondary | ICD-10-CM | POA: Insufficient documentation

## 2013-04-29 ENCOUNTER — Telehealth: Payer: Self-pay | Admitting: Internal Medicine

## 2013-04-29 ENCOUNTER — Ambulatory Visit: Payer: Self-pay | Admitting: Family Medicine

## 2013-04-29 NOTE — Telephone Encounter (Signed)
Patient Information:  Caller Name: Reyna  Phone: [phone]  Patient: Erin, [name]  Gender: Female  DOB: 23-Mar-1951  Age: 62 Years  PCP: Birdie Sons (Adults only)  Office Follow Up:  Does the office need to follow up with this patient?: No  Instructions For The Office: N/A   Symptoms  Reason For Call & Symptoms: Pt has had poison ivy x 1 week. It is spreadying up on to her eyebrows/kept her up all night despite tx with otc products.  Reviewed Health History In EMR: Yes  Reviewed Medications In EMR: Yes  Reviewed Allergies In EMR: Yes  Reviewed Surgeries / Procedures: Yes  Date of Onset of Symptoms: 04/28/2013  Treatments Tried: HC cream/IV rest otc lotion  Treatments Tried Worked: No  Guideline(s) Used:  Poison Ivy - [facility] or [facility]  Disposition Per Guideline:   See Today in Office  Reason For Disposition Reached:   Severe itching interferes with normal activities (e.g., work or school) or prevents sleep  Advice Given:  N/A  Patient Will Follow Care Advice:  YES  Appointment Scheduled:  04/29/2013 16:30:00 Appointment Scheduled Provider:  Evelena Peat (Family Practice)

## 2013-06-15 ENCOUNTER — Ambulatory Visit: Payer: BC Managed Care – PPO | Admitting: Internal Medicine

## 2013-07-12 ENCOUNTER — Encounter: Payer: Self-pay | Admitting: Internal Medicine

## 2013-07-12 ENCOUNTER — Ambulatory Visit (INDEPENDENT_AMBULATORY_CARE_PROVIDER_SITE_OTHER): Payer: BC Managed Care – PPO | Admitting: Internal Medicine

## 2013-07-12 VITALS — BP 132/82 | Temp 98.4°F | Ht 65.0 in | Wt 150.0 lb

## 2013-07-12 DIAGNOSIS — Z23 Encounter for immunization: Secondary | ICD-10-CM

## 2013-07-12 DIAGNOSIS — D179 Benign lipomatous neoplasm, unspecified: Secondary | ICD-10-CM

## 2013-07-15 NOTE — Progress Notes (Signed)
She feels well has noted "soft bumps" left shoulder, right leg. These hae been present for years. Occasionally they are uncomfortable.  Reviewed pmh, meds Reviewed itals Lipomas on shoulder and anterior thigh  Lipomas- no treatment Reassurance provided

## 2013-09-06 ENCOUNTER — Other Ambulatory Visit: Payer: Self-pay | Admitting: *Deleted

## 2013-09-06 MED ORDER — VALACYCLOVIR HCL 500 MG PO TABS: ORAL_TABLET | ORAL | Status: DC

## 2013-09-08 ENCOUNTER — Other Ambulatory Visit: Payer: Self-pay | Admitting: *Deleted

## 2013-09-08 MED ORDER — VALACYCLOVIR HCL 500 MG PO TABS: ORAL_TABLET | ORAL | Status: DC

## 2013-09-26 ENCOUNTER — Encounter: Payer: Self-pay | Admitting: Certified Nurse Midwife

## 2013-09-27 ENCOUNTER — Encounter: Payer: Self-pay | Admitting: Certified Nurse Midwife

## 2013-09-27 ENCOUNTER — Ambulatory Visit (INDEPENDENT_AMBULATORY_CARE_PROVIDER_SITE_OTHER): Payer: BC Managed Care – PPO | Admitting: Certified Nurse Midwife

## 2013-09-27 VITALS — BP 114/64 | HR 68 | Resp 16 | Ht 65.25 in | Wt 150.0 lb

## 2013-09-27 DIAGNOSIS — R350 Frequency of micturition: Secondary | ICD-10-CM

## 2013-09-27 DIAGNOSIS — Z0000 Encounter for general adult medical examination without abnormal findings: Secondary | ICD-10-CM

## 2013-09-27 DIAGNOSIS — Z01419 Encounter for gynecological examination (general) (routine) without abnormal findings: Secondary | ICD-10-CM

## 2013-09-27 DIAGNOSIS — N952 Postmenopausal atrophic vaginitis: Secondary | ICD-10-CM

## 2013-09-27 LAB — POCT URINALYSIS DIPSTICK
Ketones, UA: NEGATIVE
Nitrite, UA: NEGATIVE
Protein, UA: NEGATIVE
Urobilinogen, UA: NEGATIVE

## 2013-09-27 MED ORDER — ESTRADIOL 0.1 MG/GM VA CREA: TOPICAL_CREAM | VAGINAL | Status: DC

## 2013-09-27 NOTE — Progress Notes (Signed)
Note reviewed, agree with plan.   , MD  

## 2013-09-27 NOTE — Patient Instructions (Signed)

## 2013-09-27 NOTE — Progress Notes (Signed)
62 y.o. G25P2002 Married Caucasian Fe here for annual exam.  Menopausal no HRT.Denies vaginal bleeding. Using Estrace vaginal cream for dryness. Complaining of urinary frequency, no urgency or pain. Denies fever or chills, headache or back pain. Patient feels it maybe coming from no using Estrace cream as directed. Used in the past 2 days and symptoms better. No vaginal discharge or odor. Sees PCP every 2 years for aex and labs. Patient has appointment for 3/15. Patient remarks she has sharp chest pain about once monthly, wakes her from sleep and resolves after "a little while" Denies throat pain, or pain in neck. No dizziness when occurs. No Gi distress. No other issues today.  Patient's last menstrual period was 10/20/2000.          Sexually active: yes  The current method of family planning is vasectomy.    Exercising: yes  tennis & golf Smoker:  no  Health Maintenance: Pap:  09-23-12 neg HPV HR neg MMG:  10/14 Colonoscopy:  2005 BMD:   2011 TDaP:  2013 Labs: Poct urine-wbc 1+, ph 7.0 Self breast exam: done monthly   reports that she has never smoked. She does not have any smokeless tobacco history on file. She reports that she drinks about 4.8 ounces of alcohol per week. She reports that she does not use illicit drugs.  Past Medical History  Diagnosis Date  . Seasonal allergies   . GERD (gastroesophageal reflux disease)   . Asthma     as a child    Past Surgical History  Procedure Laterality Date  . Breast biopsy      left  . Breast lumpectomy  8/99    right breast- benign (lipoma)    Current Outpatient Prescriptions  Medication Sig Dispense Refill  . CALCIUM PO Take by mouth daily.      . Cholecalciferol (VITAMIN D PO) Take 1,000 Int'l Units by mouth daily.      . clorazepate (TRANXENE) 7.5 MG tablet Take 1 tablet (7.5 mg total) by mouth daily as needed.  30 tablet  2  . Cyanocobalamin (VITAMIN B 12 PO) Take by mouth as needed.      . Esomeprazole Magnesium (NEXIUM PO)  Take by mouth as needed.      Marland Kitchen estradiol (ESTRACE) 0.1 MG/GM vaginal cream 1gm two times weekly  42.5 g  3  . Multiple Vitamins-Minerals (MULTIVITAMIN PO) Take by mouth daily.      . Multiple Vitamins-Minerals (ZINC PO) Take by mouth as needed.      . Probiotic Product (PROBIOTIC PO) Take by mouth as needed.      . valACYclovir (VALTREX) 500 MG tablet TAKE 1 TABLET BY MOUTH TWICE A DAY AS NEEDED  30 tablet  2   No current facility-administered medications for this visit.    Family History  Problem Relation Age of Onset  . Colon cancer Neg Hx   . Stomach cancer Neg Hx   . Heart disease Mother   . Diabetes Mother   . Cancer Mother     hodgekins lymphoma  . Diabetes Sister   . Diabetes Maternal Grandmother     ROS:  Pertinent items are noted in HPI.  Otherwise, a comprehensive ROS was negative.  Exam:   BP 114/64  Pulse 68  Resp 16  Ht 5' 5.25" (1.657 m)  Wt 150 lb (68.04 kg)  BMI 24.78 kg/m2  LMP 10/20/2000 Height: 5' 5.25" (165.7 cm)  Ht Readings from Last 3 Encounters:  09/27/13 5' 5.25" (  1.657 m)  07/12/13 5\' 5"  (1.651 m)  07/01/12 5\' 5"  (1.651 m)    General appearance: alert, cooperative and appears stated age Head: Normocephalic, without obvious abnormality, atraumatic Neck: no adenopathy, supple, symmetrical, trachea midline and thyroid normal to inspection and palpation and non-palpable Lungs: clear to auscultation bilaterally Breasts: normal appearance, no masses or tenderness, No nipple retraction or dimpling, No nipple discharge or bleeding, No axillary or supraclavicular adenopathy Heart: regular rate and rhythm Abdomen: soft, non-tender; no masses,  no organomegaly Extremities: extremities normal, atraumatic, no cyanosis or edema Skin: Skin color, texture, turgor normal. No rashes or lesions Lymph nodes: Cervical, supraclavicular, and axillary nodes normal. No abnormal inguinal nodes palpated Neurologic: Grossly normal   Pelvic: External genitalia:  no  lesions              Urethra:  normal appearing urethra with no masses, tenderness or lesions              Bartholin's and Skene's: normal                 Vagina: atrophic appearing vagina with normal color and discharge, no lesions              Cervix: non tender, normal              Pap taken: no Bimanual Exam:  Uterus:  normal size, contour, position, consistency, mobility, non-tender and anteflexed              Adnexa: normal adnexa and no mass, fullness, tenderness               Rectovaginal: Confirms               Anus:  normal sphincter tone, no lesions  A:  Well Woman with normal exam  Menopausal no HRT  Atrophic vaginitis uses Estrace cream with good results, needs Rx  Urinary frequency with normal exam, R/O UTI  Occasional chest pain ?etiology  P:   Reviewed health and wellness pertinent to exam  Aware if vaginal bleeding needs evaluation  Rx Estrace cream see order  Continue good water intake. Aware of UTI symptoms   ZOX:WRUEA micro/culture  Discussed concerns with chest pain and need for evaluation. Patient agrees to call PCP for appointment  Pap smear as per guidelines   Mammogram yearly pap smear not taken today  counseled on breast self exam, mammography screening, osteoporosis, adequate intake of calcium and vitamin D, diet and exercise, Kegel's exercises  return annually or prn  An After Visit Summary was printed and given to the patient.

## 2013-09-28 LAB — URINALYSIS, MICROSCOPIC ONLY
Bacteria, UA: NONE SEEN
Casts: NONE SEEN

## 2013-09-29 LAB — URINE CULTURE: Colony Count: 5000

## 2013-11-09 ENCOUNTER — Telehealth: Payer: Self-pay | Admitting: Certified Nurse Midwife

## 2013-11-09 NOTE — Telephone Encounter (Signed)
Patient needs an order faxed to [facility] for BMD appointment 11/11/13 @ 11:30.

## 2013-11-10 NOTE — Telephone Encounter (Signed)
Faxed signed order with fax confirmation received.

## 2013-11-10 NOTE — Telephone Encounter (Signed)
Patient aware.

## 2013-11-22 ENCOUNTER — Encounter: Payer: Self-pay | Admitting: *Deleted

## 2013-11-23 ENCOUNTER — Ambulatory Visit (INDEPENDENT_AMBULATORY_CARE_PROVIDER_SITE_OTHER): Payer: Self-pay | Admitting: *Deleted

## 2013-11-23 DIAGNOSIS — I781 Nevus, non-neoplastic: Secondary | ICD-10-CM

## 2013-11-23 HISTORY — DX: Nevus, non-neoplastic: I78.1

## 2013-11-23 NOTE — Progress Notes (Signed)
X=.3% Sotradecol administered with a 27g butterfly.  Patient received a total of 2cc.  Cutaneous Laser:pulsed mode  810 j/cm2  428ms delay  74ms duration  0.5 spot  Total pulses: 856 Total energy 359  Total time::09  Photos: yes  Compression stockings applied: yes   Really only needed three sticks on right side of her right knee. Did have scattered red capillaries so treated her with the cutaneous laser too. Will follow prn.

## 2013-11-24 ENCOUNTER — Telehealth: Payer: Self-pay

## 2013-11-24 NOTE — Telephone Encounter (Signed)
Pt not home. I will try later to give patient her bone density results. Patients chart is in my cabinet will be scanned in once patient notified

## 2013-11-25 ENCOUNTER — Encounter: Payer: Self-pay | Admitting: Vascular Surgery

## 2013-11-29 NOTE — Telephone Encounter (Signed)
Patient notified of results.

## 2014-01-03 ENCOUNTER — Other Ambulatory Visit (INDEPENDENT_AMBULATORY_CARE_PROVIDER_SITE_OTHER): Payer: BC Managed Care – PPO

## 2014-01-03 DIAGNOSIS — Z0000 Encounter for general adult medical examination without abnormal findings: Secondary | ICD-10-CM

## 2014-01-03 LAB — BASIC METABOLIC PANEL
BUN: 16 mg/dL (ref 6–23)
CALCIUM: 9.8 mg/dL (ref 8.4–10.5)
CO2: 28 mEq/L (ref 19–32)
CREATININE: 0.7 mg/dL (ref 0.4–1.2)
Chloride: 106 mEq/L (ref 96–112)
GFR: 85.31 mL/min (ref >60.00)
Glucose, Bld: 105 mg/dL — ABNORMAL HIGH (ref 70–99)
Potassium: 4.7 mEq/L (ref 3.5–5.1)
Sodium: 140 mEq/L (ref 135–145)

## 2014-01-03 LAB — LIPID PANEL
CHOL/HDL RATIO: 3
Cholesterol: 220 mg/dL — ABNORMAL HIGH (ref 0–200)
HDL: 63.8 mg/dL (ref >39.00)
LDL Cholesterol: 122 mg/dL — ABNORMAL HIGH (ref 0–99)
Triglycerides: 171 mg/dL — ABNORMAL HIGH (ref 0.0–149.0)
VLDL: 34.2 mg/dL (ref 0.0–40.0)

## 2014-01-03 LAB — CBC WITH DIFFERENTIAL/PLATELET
BASOS PCT: 0.7 % (ref 0.0–3.0)
Basophils Absolute: 0 10*3/uL (ref 0.0–0.1)
EOS PCT: 3.2 % (ref 0.0–5.0)
Eosinophils Absolute: 0.2 10*3/uL (ref 0.0–0.7)
HCT: 39.8 % (ref 36.0–46.0)
Hemoglobin: 13.4 g/dL (ref 12.0–15.0)
LYMPHS PCT: 45.4 % (ref 12.0–46.0)
Lymphs Abs: 2.7 10*3/uL (ref 0.7–4.0)
MCHC: 33.6 g/dL (ref 30.0–36.0)
MCV: 95 fl (ref 78.0–100.0)
MONOS PCT: 12.3 % — AB (ref 3.0–12.0)
Monocytes Absolute: 0.7 10*3/uL (ref 0.1–1.0)
NEUTROS PCT: 38.4 % — AB (ref 43.0–77.0)
Neutro Abs: 2.2 10*3/uL (ref 1.4–7.7)
PLATELETS: 221 10*3/uL (ref 150.0–400.0)
RBC: 4.18 Mil/uL (ref 3.87–5.11)
RDW: 12.8 % (ref 11.5–14.6)
WBC: 5.9 10*3/uL (ref 4.5–10.5)

## 2014-01-03 LAB — HEPATIC FUNCTION PANEL
ALK PHOS: 59 U/L (ref 39–117)
ALT: 23 U/L (ref 0–35)
AST: 25 U/L (ref 0–37)
Albumin: 4.3 g/dL (ref 3.5–5.2)
BILIRUBIN DIRECT: 0.1 mg/dL (ref 0.0–0.3)
BILIRUBIN TOTAL: 0.9 mg/dL (ref 0.3–1.2)
TOTAL PROTEIN: 6.7 g/dL (ref 6.0–8.3)

## 2014-01-03 LAB — POCT URINALYSIS DIPSTICK
BILIRUBIN UA: NEGATIVE
Glucose, UA: NEGATIVE
Ketones, UA: NEGATIVE
LEUKOCYTES UA: NEGATIVE
NITRITE UA: NEGATIVE
PH UA: 7
Protein, UA: NEGATIVE
RBC UA: NEGATIVE
Spec Grav, UA: 1.02
UROBILINOGEN UA: 0.2

## 2014-01-03 LAB — TSH: TSH: 1.45 u[IU]/mL (ref 0.35–5.50)

## 2014-01-04 ENCOUNTER — Encounter: Payer: Self-pay | Admitting: Internal Medicine

## 2014-01-04 ENCOUNTER — Ambulatory Visit (INDEPENDENT_AMBULATORY_CARE_PROVIDER_SITE_OTHER): Payer: BC Managed Care – PPO | Admitting: Internal Medicine

## 2014-01-04 VITALS — BP 142/90 | HR 64 | Temp 98.3°F | Ht 66.5 in | Wt 150.0 lb

## 2014-01-04 DIAGNOSIS — Z0000 Encounter for general adult medical examination without abnormal findings: Secondary | ICD-10-CM

## 2014-01-04 MED ORDER — VALACYCLOVIR HCL 500 MG PO TABS: ORAL_TABLET | ORAL | Status: DC

## 2014-01-04 NOTE — Progress Notes (Signed)
Pre visit review using our clinic review tool, if applicable. No additional management support is needed unless otherwise documented below in the visit note. 

## 2014-01-04 NOTE — Progress Notes (Signed)
cpx  Past Medical History  Diagnosis Date  . Seasonal allergies   . GERD (gastroesophageal reflux disease)   . Asthma     as a child    History   Social History  . Marital Status: Married    Spouse Name: N/A    Number of Children: N/A  . Years of Education: N/A   Occupational History  . Not on file.   Social History Main Topics  . Smoking status: Never Smoker   . Smokeless tobacco: Not on file  . Alcohol Use: 4.8 oz/week    8 Glasses of wine per week  . Drug Use: No  . Sexual Activity: Yes    Partners: Male     Comment: husband vasectomy   Other Topics Concern  . Not on file   Social History Narrative  . No narrative on file    Past Surgical History  Procedure Laterality Date  . Breast biopsy      left  . Breast lumpectomy  8/99    right breast- benign (lipoma)    Family History  Problem Relation Age of Onset  . Colon cancer Neg Hx   . Stomach cancer Neg Hx   . Heart disease Mother   . Diabetes Mother   . Cancer Mother     hodgekins lymphoma  . Diabetes Sister   . Diabetes Maternal Grandmother     Allergies  Allergen Reactions  . Cefdinir     REACTION: rash  . Sulfonamide Derivatives     REACTION: hives    Current Outpatient Prescriptions on File Prior to Visit  Medication Sig Dispense Refill  . clorazepate (TRANXENE) 7.5 MG tablet Take 1 tablet (7.5 mg total) by mouth daily as needed.  30 tablet  2  . Cyanocobalamin (VITAMIN B 12 PO) Take by mouth as needed.      . Esomeprazole Magnesium (NEXIUM PO) Take by mouth as needed.      Marland Kitchen estradiol (ESTRACE) 0.1 MG/GM vaginal cream 1gm two times weekly  42.5 g  3  . Multiple Vitamins-Minerals (MULTIVITAMIN PO) Take by mouth daily.      . Probiotic Product (PROBIOTIC PO) Take by mouth as needed.      . valACYclovir (VALTREX) 500 MG tablet TAKE 1 TABLET BY MOUTH TWICE A DAY AS NEEDED  30 tablet  2   No current facility-administered medications on file prior to visit.     patient denies chest pain,  shortness of breath, orthopnea. Denies lower extremity edema, abdominal pain, change in appetite, change in bowel movements. Patient denies rashes, musculoskeletal complaints. No other specific complaints in a complete review of systems.   BP 142/90  Pulse 64  Temp(Src) 98.3 F (36.8 C) (Oral)  Ht 5' 6.5" (1.689 m)  Wt 150 lb (68.04 kg)  BMI 23.85 kg/m2  LMP 10/20/2000  Well-developed well-nourished female in no acute distress. HEENT exam atraumatic, normocephalic, extraocular muscles are intact. Neck is supple. No jugular venous distention no thyromegaly. Chest clear to auscultation without increased work of breathing. Cardiac exam S1 and S2 are regular. Abdominal exam active bowel sounds, soft, nontender. Extremities no edema. Neurologic exam she is alert without any motor sensory deficits. Gait is normal.  Well visit- health maint utd

## 2014-01-09 ENCOUNTER — Encounter: Payer: BC Managed Care – PPO | Admitting: Internal Medicine

## 2014-02-08 ENCOUNTER — Encounter: Payer: Self-pay | Admitting: Internal Medicine

## 2014-05-10 ENCOUNTER — Other Ambulatory Visit: Payer: Self-pay | Admitting: Certified Nurse Midwife

## 2014-05-10 ENCOUNTER — Telehealth: Payer: Self-pay | Admitting: Certified Nurse Midwife

## 2014-05-10 DIAGNOSIS — Z1231 Encounter for screening mammogram for malignant neoplasm of breast: Secondary | ICD-10-CM

## 2014-05-10 NOTE — Telephone Encounter (Signed)
Spoke with patient. Advised that it is recommended that patient have screening mammogram yearly as long as she is not having any problems. If she has any breast problems will need to be seen and other orders would be needed. Patient denies any current problems. Patient will call to schedule screening mammogram.  Routing to provider for final review. Patient agreeable to disposition. Will close encounter

## 2014-05-10 NOTE — Telephone Encounter (Signed)
Patient wants to know if she needs to have a mammogram every year. She got a letter saying it was time to schedule one.

## 2014-05-17 ENCOUNTER — Ambulatory Visit (HOSPITAL_COMMUNITY)
Admission: RE | Admit: 2014-05-17 | Discharge: 2014-05-17 | Disposition: A | Discharge status: Home / Self Care | Payer: BC Managed Care – PPO | Source: Ambulatory Visit | Attending: Certified Nurse Midwife | Admitting: Certified Nurse Midwife

## 2014-05-17 DIAGNOSIS — Z1231 Encounter for screening mammogram for malignant neoplasm of breast: Secondary | ICD-10-CM

## 2014-06-02 ENCOUNTER — Encounter: Payer: Self-pay | Admitting: Internal Medicine

## 2014-07-14 ENCOUNTER — Ambulatory Visit (AMBULATORY_SURGERY_CENTER): Payer: Self-pay | Admitting: *Deleted

## 2014-07-14 VITALS — Ht 65.0 in | Wt 150.4 lb

## 2014-07-14 DIAGNOSIS — Z1211 Encounter for screening for malignant neoplasm of colon: Secondary | ICD-10-CM

## 2014-07-14 MED ORDER — MOVIPREP 100 G PO SOLR: 1.0000 | Freq: Once | ORAL | Status: DC

## 2014-07-14 NOTE — Progress Notes (Signed)
No home 02. ewm Previous colon 2005 with Dr Lyla Son. ewm No diet pills. ewm Woke up with sedation with egd. No other issues with the sedation. But nausea  ewm

## 2014-07-25 ENCOUNTER — Encounter: Payer: Self-pay | Admitting: Internal Medicine

## 2014-07-27 ENCOUNTER — Ambulatory Visit (INDEPENDENT_AMBULATORY_CARE_PROVIDER_SITE_OTHER): Payer: BC Managed Care – PPO

## 2014-07-27 DIAGNOSIS — Z23 Encounter for immunization: Secondary | ICD-10-CM

## 2014-08-04 ENCOUNTER — Encounter: Payer: Self-pay | Admitting: Internal Medicine

## 2014-08-04 ENCOUNTER — Ambulatory Visit (AMBULATORY_SURGERY_CENTER): Payer: BC Managed Care – PPO | Admitting: Internal Medicine

## 2014-08-04 VITALS — BP 142/71 | HR 54 | Temp 97.9°F | Resp 16 | Ht 65.0 in | Wt 184.0 lb

## 2014-08-04 DIAGNOSIS — Z1211 Encounter for screening for malignant neoplasm of colon: Secondary | ICD-10-CM

## 2014-08-04 MED ORDER — SODIUM CHLORIDE 0.9 % IV SOLN: 500.0000 mL | INTRAVENOUS | Status: DC

## 2014-08-04 NOTE — Op Note (Signed)
[facility]  [address] [place], [id number]   COLONOSCOPY PROCEDURE REPORT  PATIENT: [name], [name]  MR#: [id number] BIRTHDATE: 04-22-1951 , 63  yrs. old GENDER: female ENDOSCOPIST: Lafayette Dragon, MD REFERRED [id number] Swords, M.D. PROCEDURE DATE:  08/04/2014 PROCEDURE:   Colonoscopy, screening First Screening Colonoscopy - Avg.  risk and is 50 yrs.  old or older - No.  Prior Negative Screening - Now for repeat screening. 10 or more years since last screening  History of Adenoma - Now for follow-up colonoscopy & has been > or = to 3 yrs.  N/A  Polyps Removed Today? No.  Polyps Removed Today? No.  Recommend repeat exam, <10 yrs? Polyps Removed Today? No.  Recommend repeat exam, <10 yrs? No. ASA CLASS:   Class II INDICATIONS:average risk for colon cancer and last colonoscopy July 2005 was normal. MEDICATIONS: Monitored anesthesia care and Propofol 340 mg IV  DESCRIPTION OF PROCEDURE:   After the risks benefits and alternatives of the procedure were thoroughly explained, informed consent was obtained.  The digital rectal exam revealed no abnormalities of the rectum.   The LB [id number]  endoscope was introduced through the anus and advanced to the cecum, which was identified by both the appendix and ileocecal valve. No adverse events experienced.   The quality of the prep was excellent, using MoviPrep  The instrument was then slowly withdrawn as the colon was fully examined.      COLON FINDINGS: Small internal Grade I hemorrhoids were found. Retroflexed views revealed no abnormalities. The time to cecum=9 minutes 38 seconds.  Withdrawal time=6 minutes 03 seconds.  The scope was withdrawn and the procedure completed. COMPLICATIONS: There were no immediate complications.  ENDOSCOPIC IMPRESSION: Small internal Grade I hemorrhoids  RECOMMENDATIONS: High fiber diet Recall colonoscopy in 10 years  eSigned:  Lafayette Dragon, MD 08/04/2014 12:15  PM   cc:

## 2014-08-04 NOTE — Patient Instructions (Addendum)
YOU HAD AN ENDOSCOPIC PROCEDURE TODAY AT THE [facility]: Refer to the procedure report that was given to you for any specific questions about what was found during the examination.  If the procedure report does not answer your questions, please call your gastroenterologist to clarify.  If you requested that your care partner not be given the details of your procedure findings, then the procedure report has been included in a sealed envelope for you to review at your convenience later.  YOU SHOULD EXPECT: Some feelings of bloating in the abdomen. Passage of more gas than usual.  Walking can help get rid of the air that was put into your GI tract during the procedure and reduce the bloating. If you had a lower endoscopy (such as a colonoscopy or flexible sigmoidoscopy) you may notice spotting of blood in your stool or on the toilet paper. If you underwent a bowel prep for your procedure, then you may not have a normal bowel movement for a few days.  DIET: Your first meal following the procedure should be a light meal and then it is ok to progress to your normal diet.  A half-sandwich or bowl of soup is an example of a good first meal.  Heavy or fried foods are harder to digest and may make you feel nauseous or bloated.  Likewise meals heavy in dairy and vegetables can cause extra gas to form and this can also increase the bloating.  Drink plenty of fluids but you should avoid alcoholic beverages for 24 hours.  ACTIVITY: Your care partner should take you home directly after the procedure.  You should plan to take it easy, moving slowly for the rest of the day.  You can resume normal activity the day after the procedure however you should NOT DRIVE or use heavy machinery for 24 hours (because of the sedation medicines used during the test).    SYMPTOMS TO REPORT IMMEDIATELY: A gastroenterologist can be reached at any hour.  During normal business hours, 8:30 AM to 5:00 PM Monday through Friday,  call [phone].  After hours and on weekends, please call the GI answering service at [phone] who will take a message and have the physician on call contact you.   Following lower endoscopy (colonoscopy or flexible sigmoidoscopy):  Excessive amounts of blood in the stool  Significant tenderness or worsening of abdominal pains  Swelling of the abdomen that is new, acute  Fever of 100F or higher  FOLLOW UP: If any biopsies were taken you will be contacted by phone or by letter within the next 1-3 weeks.  Call your gastroenterologist if you have not heard about the biopsies in 3 weeks.  Our staff will call the home number listed on your records the next business day following your procedure to check on you and address any questions or concerns that you may have at that time regarding the information given to you following your procedure. This is a courtesy call and so if there is no answer at the home number and we have not heard from you through the emergency physician on call, we will assume that you have returned to your regular daily activities without incident.  SIGNATURES/CONFIDENTIALITY: You and/or your care partner have signed paperwork which will be entered into your electronic medical record.  These signatures attest to the fact that that the information above on your After Visit Summary has been reviewed and is understood.  Full responsibility of the confidentiality of this   discharge information lies with you and/or your care-partner.  Hemorrhoids, high fiber diet-handouts given  Repeat colonoscopy in 10 years-2025.

## 2014-08-04 NOTE — Progress Notes (Signed)
Procedure ends, to recovery, report given and VSS. 

## 2014-08-07 ENCOUNTER — Telehealth: Payer: Self-pay | Admitting: *Deleted

## 2014-08-07 NOTE — Telephone Encounter (Signed)
  Follow up Call-  Call back number 08/04/2014  Post procedure Call Back phone  # [phone]  Permission to leave phone message Yes     Patient questions:  Do you have a fever, pain , or abdominal swelling? No. Pain Score  0 *  Have you tolerated food without any problems? Yes.    Have you been able to return to your normal activities? Yes.    Do you have any questions about your discharge instructions: Diet   No. Medications  No. Follow up visit  No.  Do you have questions or concerns about your Care? No.  Actions: * If pain score is 4 or above: No action needed, pain <4.

## 2014-08-21 ENCOUNTER — Encounter: Payer: Self-pay | Admitting: Internal Medicine

## 2014-09-28 ENCOUNTER — Encounter: Payer: Self-pay | Admitting: Certified Nurse Midwife

## 2014-09-28 ENCOUNTER — Ambulatory Visit (INDEPENDENT_AMBULATORY_CARE_PROVIDER_SITE_OTHER): Payer: BC Managed Care – PPO | Admitting: Certified Nurse Midwife

## 2014-09-28 VITALS — BP 130/80 | HR 68 | Resp 18 | Ht 65.25 in | Wt 150.0 lb

## 2014-09-28 DIAGNOSIS — Z124 Encounter for screening for malignant neoplasm of cervix: Secondary | ICD-10-CM

## 2014-09-28 DIAGNOSIS — B009 Herpesviral infection, unspecified: Secondary | ICD-10-CM

## 2014-09-28 DIAGNOSIS — Z01419 Encounter for gynecological examination (general) (routine) without abnormal findings: Secondary | ICD-10-CM

## 2014-09-28 DIAGNOSIS — N952 Postmenopausal atrophic vaginitis: Secondary | ICD-10-CM

## 2014-09-28 MED ORDER — VALACYCLOVIR HCL 500 MG PO TABS
ORAL_TABLET | ORAL | Status: DC
Start: 2014-09-28 — End: 2015-10-03

## 2014-09-28 MED ORDER — ESTRADIOL 0.1 MG/GM VA CREA: TOPICAL_CREAM | VAGINAL | Status: DC

## 2014-09-28 NOTE — Patient Instructions (Signed)

## 2014-09-28 NOTE — Progress Notes (Signed)
63 y.o. G67P2002 Married Caucasian Fe here for annual exam. Menopausal no HRT. Denies vaginal bleeding or vaginal dryness. Busy with the holidays!Marland Kitchen Uses Estrace for vaginal dryness sporadic usually no more than once every two weeks and uses Olive Oil routinely.[facility]ur[facility] PCP for aex and labs. Patient feels abdominal wall hernia has reoccurred, had repaired many years ago. Plans to schedule appointment with surgeon again. Has had only one HSV outbreak in the past year. Needs refill on Valtrex. No other health issues.  Patient's last menstrual period was 10/20/2000.          Sexually active: Yes.    The current method of family planning is post menopausal status.    Exercising: Yes.    Tennis and golf Smoker:  no  Health Maintenance: Pap:  09/2012 Neg. HR HPV:Neg MMG: 04/2014 BIRADS1:Neg Colonoscopy: 07/2014 Normal- Every 10 years  BMD:  10/2013 Normal TDaP: 2013 Labs: PCP   reports that she has never smoked. She has never used smokeless tobacco. She reports that she drinks about 4.8 oz of alcohol per week. She reports that she does not use illicit drugs.  Past Medical History  Diagnosis Date  . Seasonal allergies   . GERD (gastroesophageal reflux disease)   . Asthma     as a child  . Allergy     Past Surgical History  Procedure Laterality Date  . Breast biopsy      left  . Breast lumpectomy  8/99    left breast- benign (lipoma)  . Colonoscopy  2005    Dr Lyla Son  . Toe surgery      Current Outpatient Prescriptions  Medication Sig Dispense Refill  . calcium-vitamin D (OSCAL WITH D) 250-125 MG-UNIT per tablet Take 1 tablet by mouth daily.    . clorazepate (TRANXENE) 7.5 MG tablet Take 1 tablet (7.5 mg total) by mouth daily as needed. 30 tablet 2  . Cyanocobalamin (VITAMIN B 12 PO) Take by mouth as needed.    . Esomeprazole Magnesium (NEXIUM PO) Take by mouth as needed.    Marland Kitchen estradiol (ESTRACE) 0.1 MG/GM vaginal cream 1gm two times weekly 42.5 g 3  . Multiple Vitamins-Minerals  (MULTIVITAMIN PO) Take by mouth daily.    . Probiotic Product (PROBIOTIC PO) Take by mouth as needed.    . valACYclovir (VALTREX) 500 MG tablet TAKE 1 TABLET BY MOUTH TWICE A DAY AS NEEDED 30 tablet 2   No current facility-administered medications for this visit.    Family History  Problem Relation Age of Onset  . Colon cancer Neg Hx   . Stomach cancer Neg Hx   . Heart disease Mother   . Diabetes Mother   . Cancer Mother     hodgekins lymphoma  . Diabetes Sister   . Diabetes Maternal Grandmother     ROS:  Pertinent items are noted in HPI.  Otherwise, a comprehensive ROS was negative.  Exam:   BP 130/80 mmHg  Pulse 68  Resp 18  Ht 5' 5.25" (1.657 m)  Wt 150 lb (68.04 kg)  BMI 24.78 kg/m2  LMP 10/20/2000 Height: 5' 5.25" (165.7 cm)  Ht Readings from Last 3 Encounters:  09/28/14 5' 5.25" (1.657 m)  08/04/14 5\' 5"  (1.651 m)  07/14/14 5\' 5"  (1.651 m)    General appearance: alert, cooperative and appears stated age Head: Normocephalic, without obvious abnormality, atraumatic Neck: no adenopathy, supple, symmetrical, trachea midline and thyroid normal to inspection and palpation and non-palpable Lungs: clear to auscultation bilaterally Breasts:  normal appearance, no masses or tenderness, No nipple retraction or dimpling, No nipple discharge or bleeding, No axillary or supraclavicular adenopathy Heart: regular rate and rhythm Abdomen: soft, non-tender; no masses,  no organomegaly Extremities: extremities normal, atraumatic, no cyanosis or edema Skin: Skin color, texture, turgor normal. No rashes or lesions Lymph nodes: Cervical, supraclavicular, and axillary nodes normal. No abnormal inguinal nodes palpated Neurologic: Grossly normal   Pelvic: External genitalia:  no lesions              Urethra:  normal appearing urethra with no masses, tenderness or lesions              Bartholin's and Skene's: normal                 Vagina: normal appearing vagina with normal color and  discharge, no lesions              Cervix: normal, non tender, no lesions              Pap taken: Yes.   Bimanual Exam:  Uterus:  normal size, contour, position, consistency, mobility, non-tender and anteverted              Adnexa: normal adnexa and no mass, fullness, tenderness               Rectovaginal: Confirms               Anus:  normal sphincter tone, no lesions  A:  Well Woman with normal exam  Menopausal no HRT.  Atrophic vaginitis with Estrace cream use and Olive Oil.  History of HSV 1  Abdominal hernia previous repair, scheduling appointment with MD for evaluation     P:   Reviewed health and wellness pertinent to exam  Pap smear taken today with HPV reflex  Rx Estrace cream see order  Rx Valtrex see order  Encouraged to follow up with surgeon   counseled on breast self exam, mammography screening, adequate intake of calcium and vitamin D, diet and exercise  return annually or prn  An After Visit Summary was printed and given to the patient.

## 2014-09-29 NOTE — Progress Notes (Signed)
Reviewed personally.  M. Suzanne , MD.  

## 2014-10-01 LAB — IPS PAP TEST WITH REFLEX TO HPV

## 2014-10-25 ENCOUNTER — Telehealth: Payer: Self-pay | Admitting: Certified Nurse Midwife

## 2014-10-25 NOTE — Telephone Encounter (Signed)
Routing to Dr.Silva as FYI.  Routing to provider for final review. Patient agreeable to disposition. Will close encounter

## 2014-10-25 NOTE — Telephone Encounter (Signed)
FYI: Patient has an UTI and wanted to come in to see DL. Patient aware DL is not in the office today and I offered to have the nurse speak with her regarding an appointment but she declined and states she is going to her primary doctor at [facility] instead.

## 2014-11-24 ENCOUNTER — Ambulatory Visit: Payer: Self-pay | Admitting: Family Medicine

## 2014-12-16 ENCOUNTER — Telehealth: Payer: Self-pay | Admitting: Family

## 2014-12-16 DIAGNOSIS — R059 Cough, unspecified: Secondary | ICD-10-CM

## 2014-12-16 DIAGNOSIS — R05 Cough: Secondary | ICD-10-CM

## 2014-12-16 DIAGNOSIS — J069 Acute upper respiratory infection, unspecified: Secondary | ICD-10-CM

## 2014-12-16 MED ORDER — AZITHROMYCIN 250 MG PO TABS: ORAL_TABLET | ORAL | Status: DC

## 2014-12-16 MED ORDER — BENZONATATE 200 MG PO CAPS: 200.0000 mg | ORAL_CAPSULE | Freq: Three times a day (TID) | ORAL | Status: DC | PRN

## 2014-12-16 NOTE — Progress Notes (Signed)
We are sorry that you are not feeling well.  Here is how we plan to help!  Based on what you have shared with me it looks like you have upper respiratory tract inflammation that has resulted in a significant cough.  Inflammation and infection in the upper respiratory tract is commonly called bronchitis and has four common causes:  Allergies, Viral Infections, Acid Reflux and Bacterial Infections.  Allergies, viruses and acid reflux are treated by controlling symptoms or eliminating the cause. An example might be a cough caused by taking certain blood pressure medications. You stop the cough by changing the medication. Another example might be a cough caused by acid reflux. Controlling the reflux helps control the cough.  Based on your presentation I believe you most likely have A cough due to bacteria.  When patients have a fever and a productive cough with a change in color or increased sputum production, we are concerned about bacterial bronchitis.  If left untreated it can progress to pneumonia.  If your symptoms do not improve with your treatment plan it is important that you contact your provider.   I have prescribed Azithromyin 250 mg: two tables now and then one tablet daily for 4 additonal days   In addition you may use A non-prescription cough medication called Robitussin DAC. Take 2 teaspoons every 8 hours or Delsym: take 2 teaspoons every 12 hours., A non-prescription cough medication called Mucinex DM: take 2 tablets every 12 hours. and A prescription cough medication called Tessalon Perles 100mg. You may take 1-2 capsules every 8 hours as needed for your cough.    HOME CARE . Only take medications as instructed by your medical team. . Complete the entire course of an antibiotic. . Drink plenty of fluids and get plenty of rest. . Avoid close contacts especially the very young and the elderly . Cover your mouth if you cough or cough into your sleeve. . Always remember to wash your hands . A  steam or ultrasonic humidifier can help congestion.    GET HELP RIGHT AWAY IF: . You develop worsening fever. . You become short of breath . You cough up blood. . Your symptoms persist after you have completed your treatment plan MAKE SURE YOU   Understand these instructions.  Will watch your condition.  Will get help right away if you are not doing well or get worse.  Your e-visit answers were reviewed by a board certified advanced clinical practitioner to complete your personal care plan.  Depending on the condition, your plan could have included both over the counter or prescription medications.  If there is a problem please reply  once you have received a response from your provider.  Your safety is important to us.  If you have drug allergies check your prescription carefully.    You can use MyChart to ask questions about today's visit, request a non-urgent call back, or ask for a work or school excuse.  You will get an e-mail in the next two days asking about your experience.  I hope that your e-visit has been valuable and will speed your recovery. Thank you for using e-visits.   

## 2015-01-31 ENCOUNTER — Telehealth: Payer: Self-pay | Admitting: Certified Nurse Midwife

## 2015-01-31 NOTE — Telephone Encounter (Signed)
Left message regarding upcoming appointment has been canceled and needs to be rescheduled. °

## 2015-03-12 ENCOUNTER — Encounter: Payer: Self-pay | Admitting: Family Medicine

## 2015-03-12 ENCOUNTER — Ambulatory Visit (INDEPENDENT_AMBULATORY_CARE_PROVIDER_SITE_OTHER): Payer: PPO | Admitting: Family Medicine

## 2015-03-12 VITALS — BP 126/76 | HR 62 | Temp 98.1°F | Ht 65.25 in | Wt 153.9 lb

## 2015-03-12 DIAGNOSIS — Z23 Encounter for immunization: Secondary | ICD-10-CM | POA: Diagnosis not present

## 2015-03-12 DIAGNOSIS — K219 Gastro-esophageal reflux disease without esophagitis: Secondary | ICD-10-CM

## 2015-03-12 DIAGNOSIS — F39 Unspecified mood [affective] disorder: Secondary | ICD-10-CM

## 2015-03-12 DIAGNOSIS — K469 Unspecified abdominal hernia without obstruction or gangrene: Secondary | ICD-10-CM

## 2015-03-12 DIAGNOSIS — Z7189 Other specified counseling: Secondary | ICD-10-CM

## 2015-03-12 DIAGNOSIS — D179 Benign lipomatous neoplasm, unspecified: Secondary | ICD-10-CM

## 2015-03-12 DIAGNOSIS — F411 Generalized anxiety disorder: Secondary | ICD-10-CM

## 2015-03-12 DIAGNOSIS — D692 Other nonthrombocytopenic purpura: Secondary | ICD-10-CM

## 2015-03-12 DIAGNOSIS — Z8619 Personal history of other infectious and parasitic diseases: Secondary | ICD-10-CM | POA: Diagnosis not present

## 2015-03-12 DIAGNOSIS — Z7689 Persons encountering health services in other specified circumstances: Secondary | ICD-10-CM

## 2015-03-12 HISTORY — DX: Personal history of other infectious and parasitic diseases: Z86.19

## 2015-03-12 HISTORY — DX: Generalized anxiety disorder: F41.1

## 2015-03-12 NOTE — Addendum Note (Signed)
Addended by: Lahoma Crocker A on: 03/12/2015 12:12 PM   Modules accepted: Orders

## 2015-03-12 NOTE — Progress Notes (Signed)
HPI:  [name] is here to establish care.  Last PCP and physical: sees Melvia Heaps for gyn physical and for hx oral herpes - uses valtrex for this.  Has the following chronic problems that require follow up and concerns today:  GAD/Panic disorder: -started many years ago -very rarely gets very worked up when caring for her grandaughter with special needs -uses very rarely for panic - twice per year uses clorazepate (1/2 tablet) -denies: frequent symptoms, depression   GERD: -chronic, uses nexium prn -watches diet and does ok -no hx of esophageal stricture or esophagitis  Seasonal allergies: -chronic -uses INS -doing well per her report -worsening symptoms in the spring  Abdominal Hernia: -reports saw Dr. Zella Richer in the past for this - and opted to monitor -seems like it is worsening, discomfort with bending over when it pops out, reducible, she wants re-eval for consideration of repair -reports her gynecologist told her to see surgeon -denies: inability to reduce, severe pain, getting larger, change in bowel  Lipomas: -several and wants to have them surgically removed -one on L shoulder and one on R upper thigh -denies: rapid growth, malaise  Bruises: -chronic, take nsaids several days per week for minor aches and pains -when bumps into things gets a little bruise -denies: bleeding, bruising other wise   ROS negative for unless reported above: fevers, unintentional weight loss, hearing or vision loss, chest pain, palpitations, struggling to breath, hemoptysis, melena, hematochezia, hematuria, falls, loc, si, thoughts of self harm  Past Medical History  Diagnosis Date  . Seasonal allergies   . GERD (gastroesophageal reflux disease)   . Asthma     as a child  . Allergy   . GAD (generalized anxiety disorder) 03/12/2015    -with rare panic disorder related to caring for grandaughter   . 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 Spider veins 11/23/2013    Past  Surgical History  Procedure Laterality Date  . Breast biopsy      left  . Breast lumpectomy  8/99    left breast- benign (lipoma)  . Colonoscopy  2005    Dr Lyla Son  . Toe surgery      Family History  Problem Relation Age of Onset  . Colon cancer Neg Hx   . Stomach cancer Neg Hx   . Heart disease Mother   . Diabetes Mother   . Cancer Mother     hodgekins lymphoma  . Diabetes Sister   . Diabetes Maternal Grandmother     History   Social History  . Marital Status: Married    Spouse Name: N/A  . Number of Children: N/A  . Years of Education: N/A   Social History Main Topics  . Smoking status: Never Smoker   . Smokeless tobacco: Never Used  . Alcohol Use: 4.8 oz/week    8 Glasses of wine per week  . Drug Use: No  . Sexual Activity:    Partners: Male     Comment: husband vasectomy   Other Topics Concern  . 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  calcium-vitamin D (OSCAL WITH D) 250-125 MG-UNIT per  tablet, Take 1 tablet by mouth daily., Disp: , Rfl:  .  clorazepate (TRANXENE) 7.5 MG tablet, Take 1 tablet (7.5 mg total) by mouth daily as needed., Disp: 30 tablet, Rfl: 2 .  Cyanocobalamin (VITAMIN B 12 PO), Take by mouth as needed., Disp: , Rfl:  .  estradiol (ESTRACE) 0.1 MG/GM vaginal cream, 1gm two times weekly, Disp: 42.5 g, Rfl: 3 .  Multiple Vitamins-Minerals (MULTIVITAMIN PO), Take by mouth daily., Disp: , Rfl:  .  Probiotic Product (PROBIOTIC PO), Take by mouth as needed., Disp: , Rfl:  .  triamcinolone (NASACORT) 55 MCG/ACT AERO nasal inhaler, Place 2 sprays into the nose as needed., Disp: , Rfl:  .  valACYclovir (VALTREX) 500 MG tablet, TAKE 1 TABLET BY MOUTH TWICE A DAY AS NEEDED, Disp: 30  tablet, Rfl: 3  EXAM:  Filed Vitals:   03/12/15 1124  BP: 126/76  Pulse: 62  Temp: 98.1 F (36.7 C)    Body mass index is 25.43 kg/(m^2).  GENERAL: vitals reviewed and listed above, alert, oriented, appears well hydrated and in no acute distress  HEENT: atraumatic, conjunttiva clear, no obvious abnormalities on inspection of external nose and ears  NECK: no obvious masses on inspection  LUNGS: clear to auscultation bilaterally, no wheezes, rales or rhonchi, good air movement  CV: HRRR, no peripheral edema  ABD: small abd wall hernia, reducible  SKIN: small mobile rubbery subcut nodule L shoulder and R thigh, few purple macules on bony shin, wrist R  MS: moves all extremities without noticeable abnormality  PSYCH: pleasant and cooperative, no obvious depression or anxiety  ASSESSMENT AND PLAN:  Discussed the following assessment and plan:  GAD -prn rare use benzo ok, stable  H/O cold sores -cont episodic tx  Gastroesophageal reflux disease, esophagitis presence not specified -cont lifestyle changes  Abdominal hernia without obstruction and without gangrene, recurrence not specified, unspecified hernia type - Plan: Ambulatory referral to General Surgery -small hernia, wants to see surgeon for this and lipomas, referral place, warned of rare but serious risks with hernia and emergency precautions  Lipoma - Plan: Ambulatory referral to General Surgery -likely lipomas, discussed only sure dx is with biopsy, she prefers surgical removal of one on L shoulder as is bothersome - referral placed  Senile purpura -discussed implications, management  Establish Care: -We reviewed the PMH, PSH, FH, SH, Meds and Allergies. -We provided refills for any medications we will prescribe as needed. -We addressed current concerns per orders and patient instructions. -We have asked for records for pertinent exams, studies, vaccines and notes from previous providers. -We have advised  patient to follow up per instructions below.   -Patient advised to return or notify a doctor immediately if symptoms worsen or persist or new concerns arise.  Patient Instructions  BEFORE YOU LEAVE: -prevnar 13 -schedule Medicare Wellness visit in 6 months - come fasting  -We placed a referral for you as discussed to the surgeon. It usually takes about 1-2 weeks to process and schedule this referral. If you have not heard from [place] regarding this appointment in 2 weeks please contact our office.  We recommend the following healthy lifestyle measures: - eat a healthy diet consisting of lots of vegetables, fruits, beans, nuts, seeds, healthy meats such as white chicken and fish and whole grains.  - avoid fried foods, fast food, processed foods, sodas, red meet and other fattening foods.  - get a least 150 minutes of aerobic exercise per week.  Colin Benton R.

## 2015-03-12 NOTE — Progress Notes (Signed)
Pre visit review using our clinic review tool, if applicable. No additional management support is needed unless otherwise documented below in the visit note. 

## 2015-03-12 NOTE — Patient Instructions (Signed)
BEFORE YOU LEAVE: -prevnar 13 -schedule Medicare Wellness visit in 6 months - come fasting  -We placed a referral for you as discussed to the surgeon. It usually takes about 1-2 weeks to process and schedule this referral. If you have not heard from [place] regarding this appointment in 2 weeks please contact our office.  We recommend the following healthy lifestyle measures: - eat a healthy diet consisting of lots of vegetables, fruits, beans, nuts, seeds, healthy meats such as white chicken and fish and whole grains.  - avoid fried foods, fast food, processed foods, sodas, red meet and other fattening foods.  - get a least 150 minutes of aerobic exercise per week.

## 2015-04-27 ENCOUNTER — Ambulatory Visit (INDEPENDENT_AMBULATORY_CARE_PROVIDER_SITE_OTHER): Payer: PPO | Admitting: Family Medicine

## 2015-04-27 ENCOUNTER — Encounter: Payer: Self-pay | Admitting: Family Medicine

## 2015-04-27 VITALS — BP 142/72 | HR 70 | Temp 98.1°F | Ht 65.25 in | Wt 152.5 lb

## 2015-04-27 DIAGNOSIS — R300 Dysuria: Secondary | ICD-10-CM

## 2015-04-27 LAB — POCT URINALYSIS DIPSTICK
Bilirubin, UA: NEGATIVE
GLUCOSE UA: NEGATIVE
KETONES UA: NEGATIVE
NITRITE UA: NEGATIVE
Protein, UA: NEGATIVE
Spec Grav, UA: 1.01
Urobilinogen, UA: 0.2
pH, UA: 5.5

## 2015-04-27 LAB — URINALYSIS, MICROSCOPIC ONLY

## 2015-04-27 MED ORDER — NITROFURANTOIN MONOHYD MACRO 100 MG PO CAPS: 100.0000 mg | ORAL_CAPSULE | Freq: Two times a day (BID) | ORAL | Status: DC

## 2015-04-27 NOTE — Progress Notes (Signed)
HPI: Dysuria: -started about 2 weeks ago, felt better after azo and cranberry juice, but started back a few days ago -symptoms: frequency, urgency -denies: fevers, nausea, vomiting, flank pain, vaginal discharge, pain with urination -reports she has seen [facility] in the past for recurrent dysuria and reports was told everything was fine  ROS: See pertinent positives and negatives per HPI.  Past Medical History  Diagnosis Date  . Seasonal allergies   . GERD (gastroesophageal reflux disease)   . Asthma     as a child  . Allergy   . GAD (generalized anxiety disorder) 03/12/2015    -with rare panic disorder related to caring for grandaughter   . 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 Spider veins 11/23/2013    Past Surgical History  Procedure Laterality Date  . Breast biopsy      left  . Breast lumpectomy  8/99    left breast- benign (lipoma)  . Colonoscopy  2005    Dr Lyla Son  . Toe surgery      Family History  Problem Relation Age of Onset  . Colon cancer Neg Hx   . Stomach cancer Neg Hx   . Heart disease Mother   . Diabetes Mother   . Cancer Mother     hodgekins lymphoma  . Diabetes Sister   . Diabetes Maternal Grandmother     History   Social History  . Marital Status: Married    Spouse Name: N/A  . Number of Children: N/A  . Years of Education: N/A   Social History Main Topics  . Smoking status: Never Smoker   . Smokeless tobacco: Never Used  . Alcohol Use: 4.8 oz/week    8 Glasses of wine per week  . Drug Use: No  . Sexual Activity:    Partners: Male     Comment: husband vasectomy   Other Topics Concern  . 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  calcium-vitamin D (OSCAL WITH D)  250-125 MG-UNIT per tablet, Take 1 tablet by mouth daily., Disp: , Rfl:  .  clorazepate (TRANXENE) 7.5 MG tablet, Take 1 tablet (7.5 mg total) by mouth daily as needed., Disp: 30 tablet, Rfl: 2 .  Cyanocobalamin (VITAMIN B 12 PO), Take by mouth as needed., Disp: , Rfl:  .  estradiol (ESTRACE) 0.1 MG/GM vaginal cream, 1gm two times weekly, Disp: 42.5 g, Rfl: 3 .  Probiotic Product (PROBIOTIC PO), Take by mouth as needed., Disp: , Rfl:  .  triamcinolone (NASACORT) 55 MCG/ACT AERO nasal inhaler, Place 2 sprays into the nose as needed., Disp: , Rfl:  .  valACYclovir (VALTREX) 500 MG tablet, TAKE 1 TABLET BY MOUTH TWICE A DAY AS NEEDED, Disp: 30 tablet, Rfl: 3 .  nitrofurantoin, macrocrystal-monohydrate, (MACROBID) 100 MG capsule, Take 1 capsule (100 mg total) by mouth 2 (two) times daily., Disp: 14 capsule, Rfl: 0  EXAM:  Filed Vitals:   04/27/15 1512  BP: 142/72  Pulse: 70  Temp: 98.1 F (36.7 C)    Body mass index is 25.19 kg/(m^2).  GENERAL: vitals reviewed and listed above, alert, oriented, appears well hydrated and in no acute  distress  HEENT: atraumatic, conjunttiva clear, no obvious abnormalities on inspection of external nose and ears  NECK: no obvious masses on inspection  LUNGS: clear to auscultation bilaterally, no wheezes, rales or rhonchi, good air movement  CV: HRRR, no peripheral edema  MS: moves all extremities without noticeable abnormality  PSYCH: pleasant and cooperative, no obvious depression or anxiety  ASSESSMENT AND PLAN:  Discussed the following assessment and plan:  Dysuria - Plan: POC Urinalysis Dipstick, Culture, Urine, Urine Microscopic Only  -UA with leuks and blood, micro and culture pending -opted to start abx empirically -Patient advised to return or notify a doctor immediately if symptoms worsen or persist or new concerns arise.  There are no Patient Instructions on file for this visit.   Colin Benton R.

## 2015-04-27 NOTE — Progress Notes (Signed)
Pre visit review using our clinic review tool, if applicable. No additional management support is needed unless otherwise documented below in the visit note. 

## 2015-04-30 ENCOUNTER — Telehealth: Payer: Self-pay | Admitting: Family Medicine

## 2015-04-30 LAB — URINE CULTURE

## 2015-04-30 NOTE — Telephone Encounter (Signed)
Pt paid for macrobid out of pay and ins company just need to know if med is for uti. Pt was seen on 04-27-15

## 2015-04-30 NOTE — Telephone Encounter (Signed)
PA request was submitted and denied.  Medication is considered high risk and not covered by patient's plan.

## 2015-04-30 NOTE — Telephone Encounter (Signed)
Yes, for UTI

## 2015-06-03 ENCOUNTER — Telehealth: Payer: PPO | Admitting: Physician Assistant

## 2015-06-03 DIAGNOSIS — B9789 Other viral agents as the cause of diseases classified elsewhere: Principal | ICD-10-CM

## 2015-06-03 DIAGNOSIS — J069 Acute upper respiratory infection, unspecified: Secondary | ICD-10-CM

## 2015-06-03 MED ORDER — BENZONATATE 100 MG PO CAPS: 100.0000 mg | ORAL_CAPSULE | Freq: Three times a day (TID) | ORAL | Status: DC | PRN

## 2015-06-03 NOTE — Progress Notes (Signed)
We are sorry that you are not feeling well.  Here is how we plan to help!  Based on what you have shared with me it looks like you have upper respiratory tract inflammation that has resulted in a significant cough.  Inflammation and infection in the upper respiratory tract is commonly called bronchitis and has four common causes:  Allergies, Viral Infections, Acid Reflux and Bacterial Infections.  Allergies, viruses and acid reflux are treated by controlling symptoms or eliminating the cause. An example might be a cough caused by taking certain blood pressure medications. You stop the cough by changing the medication. Another example might be a cough caused by acid reflux. Controlling the reflux helps control the cough.  Based on your presentation I believe you most likely have A cough due to a virus.  This is called viral bronchitis and is best treated by rest, plenty of fluids and control of the cough.  You may use Ibuprofen or Tylenol as directed to help your symptoms.    In addition you may use A prescription cough medication called Tessalon Perles 100mg . You may take 1-2 capsules every 8 hours as needed for your cough.    HOME CARE . Only take medications as instructed by your medical team. . Complete the entire course of an antibiotic. . Drink plenty of fluids and get plenty of rest. . Avoid close contacts especially the very young and the elderly . Cover your mouth if you cough or cough into your sleeve. . Always remember to wash your hands . A steam or ultrasonic humidifier can help congestion.    GET HELP RIGHT AWAY IF: . You develop worsening fever. . You become short of breath . You cough up blood. . Your symptoms persist after you have completed your treatment plan MAKE SURE YOU   Understand these instructions.  Will watch your condition.  Will get help right away if you are not doing well or get worse.  Your e-visit answers were reviewed by a board certified advanced  clinical practitioner to complete your personal care plan.  Depending on the condition, your plan could have included both over the counter or prescription medications.  If there is a problem please reply  once you have received a response from your provider.  Your safety is important to [place].  If you have drug allergies check your prescription carefully.    You can use MyChart to ask questions about today's visit, request a non-urgent call back, or ask for a work or school excuse.  You will get an e-mail in the next two days asking about your experience.  I hope that your e-visit has been valuable and will speed your recovery. Thank you for using e-visits.

## 2015-06-04 ENCOUNTER — Ambulatory Visit (INDEPENDENT_AMBULATORY_CARE_PROVIDER_SITE_OTHER)
Admission: RE | Admit: 2015-06-04 | Discharge: 2015-06-04 | Disposition: A | Discharge status: Home / Self Care | Payer: PPO | Source: Ambulatory Visit | Attending: Family Medicine | Admitting: Family Medicine

## 2015-06-04 ENCOUNTER — Encounter: Payer: Self-pay | Admitting: Family Medicine

## 2015-06-04 ENCOUNTER — Ambulatory Visit (INDEPENDENT_AMBULATORY_CARE_PROVIDER_SITE_OTHER): Payer: PPO | Admitting: Family Medicine

## 2015-06-04 VITALS — BP 132/68 | HR 72 | Temp 98.4°F | Ht 65.25 in | Wt 150.5 lb

## 2015-06-04 DIAGNOSIS — R059 Cough, unspecified: Secondary | ICD-10-CM

## 2015-06-04 DIAGNOSIS — R05 Cough: Secondary | ICD-10-CM

## 2015-06-04 DIAGNOSIS — J069 Acute upper respiratory infection, unspecified: Secondary | ICD-10-CM | POA: Diagnosis not present

## 2015-06-04 MED ORDER — PREDNISONE 20 MG PO TABS: 40.0000 mg | ORAL_TABLET | Freq: Every day | ORAL | Status: DC

## 2015-06-04 NOTE — Progress Notes (Signed)
Pre visit review using our clinic review tool, if applicable. No additional management support is needed unless otherwise documented below in the visit note. 

## 2015-06-04 NOTE — Progress Notes (Signed)
HPI:  URI: -started: 5 days ago -symptoms:nasal congestion, sore throat, cough, R ear pain, scratchy throat, thought she heard rattle in chest and is very worried about this -denies: fever, SOB, NVD, tooth pain -did e-visit yesterday -has tried: tessalon perles and musinex - musinex worked better -sick contacts/travel/risks: denies flu exposure, tick exposure or or Ebola risks -Hx of: allergies - takes nasocort  ROS: See pertinent positives and negatives per HPI.  Past Medical History  Diagnosis Date  . Seasonal allergies   . GERD (gastroesophageal reflux disease)   . Asthma     as a child  . Allergy   . GAD (generalized anxiety disorder) 03/12/2015    -with rare panic disorder related to caring for grandaughter   . 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 Spider veins 11/23/2013    Past Surgical History  Procedure Laterality Date  . Breast biopsy      left  . Breast lumpectomy  8/99    left breast- benign (lipoma)  . Colonoscopy  2005    Dr Lyla Son  . Toe surgery      Family History  Problem Relation Age of Onset  . Colon cancer Neg Hx   . Stomach cancer Neg Hx   . Heart disease Mother   . Diabetes Mother   . Cancer Mother     hodgekins lymphoma  . Diabetes Sister   . Diabetes Maternal Grandmother     Social History   Social History  . Marital Status: Married    Spouse Name: N/A  . Number of Children: N/A  . Years of Education: N/A   Social History Main Topics  . Smoking status: Never Smoker   . Smokeless tobacco: Never Used  . Alcohol Use: 4.8 oz/week    8 Glasses of wine per week  . Drug Use: No  . Sexual Activity:    Partners: Male     Comment: husband vasectomy   Other Topics Concern  . 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  benzonatate (TESSALON) 100 MG capsule, Take 1 capsule (100 mg total) by mouth 3 (three) times daily as needed for cough., Disp: 30 capsule, Rfl: 0 .  calcium-vitamin D (OSCAL WITH D) 250-125 MG-UNIT per tablet, Take 1 tablet by mouth daily., Disp: , Rfl:  .  clorazepate (TRANXENE) 7.5 MG tablet, Take 1 tablet (7.5 mg total) by mouth daily as needed., Disp: 30 tablet, Rfl: 2 .  Cyanocobalamin (VITAMIN B 12 PO), Take by mouth as needed., Disp: , Rfl:  .  estradiol (ESTRACE) 0.1 MG/GM vaginal cream, 1gm two times weekly, Disp: 42.5 g, Rfl: 3 .  Probiotic Product (PROBIOTIC PO), Take by mouth as needed., Disp: , Rfl:  .  triamcinolone (NASACORT) 55 MCG/ACT AERO nasal inhaler, Place 2 sprays into the nose as needed., Disp: , Rfl:  .  valACYclovir (VALTREX) 500 MG tablet, TAKE 1 TABLET BY MOUTH TWICE A DAY AS NEEDED, Disp: 30 tablet, Rfl: 3  EXAM:  Filed Vitals:   06/04/15 1332  BP: 132/68  Pulse: 72  Temp: 98.4 F (36.9 C)    Body mass index is 24.86 kg/(m^2).  GENERAL: vitals reviewed and listed above, alert, oriented, appears well hydrated and in no acute distress  HEENT: atraumatic, conjunttiva clear, no obvious abnormalities on inspection of external nose and ears, normal appearance of ear canals and TMs, clear nasal congestion, mild post oropharyngeal erythema with PND, no tonsillar edema or exudate, no sinus TTP  NECK: no obvious masses on inspection  LUNGS: clear to auscultation bilaterally, no wheezes, rales or rhonchi, good air movement  CV: HRRR, no peripheral edema  MS: moves all extremities without noticeable abnormality  PSYCH: pleasant and cooperative, no obvious depression or anxiety  ASSESSMENT AND PLAN:  Discussed the following assessment and plan:  Acute upper respiratory infection  Cough - Plan: DG Chest 2 View  -given HPI and exam findings today, a serious infection or illness is unlikely. We discussed potential  etiologies, with VURI being most likely, and advised supportive care and monitoring. We discussed treatment side effects, likely course, antibiotic misuse, transmission, and signs of developing a serious illness. -she is worried about the cough as has never had a cough her entire life per her report - opted to get xray -remote hx of asthma and she really wants to take something for the cough, does not want codeine and other options have not been helpful. She wants to try prednisone and reports has some at home. Risks discussed. -of course, we advised to return or notify a doctor immediately if symptoms worsen or persist or new concerns arise.    There are no Patient Instructions on file for this visit.   Colin Benton R.

## 2015-06-04 NOTE — Patient Instructions (Signed)
BEFORE YOU LEAVE: -xray sheet  Go get the chest xray  INSTRUCTIONS FOR UPPER RESPIRATORY INFECTION:  -plenty of rest and fluids  -nasal saline wash 2-3 times daily (use prepackaged nasal saline or bottled/distilled water if making your own)   -can use AFRIN nasal spray for drainage and nasal congestion - but do NOT use longer then 3-4 days  -can use tylenol (in no history of liver disease) or ibuprofen (if no history of kidney disease, bowel bleeding or significant heart disease) as directed for aches and sorethroat  -in the winter time, using a humidifier at night is helpful (please follow cleaning instructions)  -if you are taking a cough medication - use only as directed, may also try a teaspoon of honey to coat the throat and throat lozenges. If given a cough medication with codeine or hydrocodone or other narcotic please be advised that this contains a strong and  potentially addicting medication. Please follow instructions carefully, take as little as possible and only use AS NEEDED for severe cough. Discuss potential side effects with your pharmacy. Please do not drive or operate machinery while taking these types of medications. Please do not take other sedating medications, drugs or alcohol while taking this medication without discussing with your doctor.  -for sore throat, salt water gargles can help  -follow up if you have fevers, facial pain, tooth pain, difficulty breathing or are worsening or symptoms persist longer then expected  Upper Respiratory Infection, Adult An upper respiratory infection (URI) is also known as the common cold. It is often caused by a type of germ (virus). Colds are easily spread (contagious). You can pass it to others by kissing, coughing, sneezing, or drinking out of the same glass. Usually, you get better in 1 to 3  weeks.  However, the cough can last for even longer. HOME CARE   Only take medicine as told by your doctor. Follow instructions  provided above.  Drink enough water and fluids to keep your pee (urine) clear or pale yellow.  Get plenty of rest.  Return to work when your temperature is < 100 for 24 hours or as told by your doctor. You may use a face mask and wash your hands to stop your cold from spreading. GET HELP RIGHT AWAY IF:   After the first few days, you feel you are getting worse.  You have questions about your medicine.  You have chills, shortness of breath, or red spit (mucus).  You have pain in the face for more then 1-2 days, especially when you bend forward.  You have a fever, puffy (swollen) neck, pain when you swallow, or white spots in the back of your throat.  You have a bad headache, ear pain, sinus pain, or chest pain.  You have a high-pitched whistling sound when you breathe in and out (wheezing).  You cough up blood.  You have sore muscles or a stiff neck. MAKE SURE YOU:   Understand these instructions.  Will watch your condition.  Will get help right away if you are not doing well or get worse. Document Released: 03/24/2008 Document Revised: 12/29/2011 Document Reviewed: 01/11/2014 [facility] Patient Information 2015 [place], [place]. This information is not intended to replace advice given to you by your health care provider. Make sure you discuss any questions you have with your health care provider.

## 2015-07-13 ENCOUNTER — Other Ambulatory Visit: Payer: Self-pay | Admitting: Orthopedic Surgery

## 2015-07-13 DIAGNOSIS — M7122 Synovial cyst of popliteal space [Baker], left knee: Secondary | ICD-10-CM

## 2015-07-17 ENCOUNTER — Other Ambulatory Visit: Payer: PPO

## 2015-07-18 ENCOUNTER — Ambulatory Visit
Admission: RE | Admit: 2015-07-18 | Discharge: 2015-07-18 | Disposition: A | Discharge status: Home / Self Care | Payer: PPO | Source: Ambulatory Visit | Attending: Orthopedic Surgery | Admitting: Orthopedic Surgery

## 2015-07-18 DIAGNOSIS — M7122 Synovial cyst of popliteal space [Baker], left knee: Secondary | ICD-10-CM

## 2015-08-17 ENCOUNTER — Other Ambulatory Visit: Payer: Self-pay

## 2015-08-17 DIAGNOSIS — Z1231 Encounter for screening mammogram for malignant neoplasm of breast: Secondary | ICD-10-CM

## 2015-08-21 ENCOUNTER — Ambulatory Visit (INDEPENDENT_AMBULATORY_CARE_PROVIDER_SITE_OTHER): Payer: PPO

## 2015-08-21 DIAGNOSIS — Z23 Encounter for immunization: Secondary | ICD-10-CM

## 2015-08-23 ENCOUNTER — Encounter: Payer: Self-pay | Admitting: Internal Medicine

## 2015-08-31 ENCOUNTER — Ambulatory Visit: Payer: PPO

## 2015-09-17 ENCOUNTER — Ambulatory Visit (INDEPENDENT_AMBULATORY_CARE_PROVIDER_SITE_OTHER): Payer: PPO | Admitting: Family Medicine

## 2015-09-17 ENCOUNTER — Encounter: Payer: Self-pay | Admitting: Family Medicine

## 2015-09-17 VITALS — BP 138/80 | HR 61 | Temp 98.1°F | Ht 66.0 in | Wt 155.3 lb

## 2015-09-17 DIAGNOSIS — Z136 Encounter for screening for cardiovascular disorders: Secondary | ICD-10-CM

## 2015-09-17 DIAGNOSIS — F411 Generalized anxiety disorder: Secondary | ICD-10-CM

## 2015-09-17 DIAGNOSIS — Z0000 Encounter for general adult medical examination without abnormal findings: Secondary | ICD-10-CM | POA: Diagnosis not present

## 2015-09-17 DIAGNOSIS — J302 Other seasonal allergic rhinitis: Secondary | ICD-10-CM | POA: Diagnosis not present

## 2015-09-17 LAB — HEMOGLOBIN A1C: HEMOGLOBIN A1C: 5.6 % (ref 4.6–6.5)

## 2015-09-17 LAB — LIPID PANEL
CHOL/HDL RATIO: 3
Cholesterol: 227 mg/dL — ABNORMAL HIGH (ref 0–200)
HDL: 67.7 mg/dL (ref >39.00)
LDL Cholesterol: 123 mg/dL — ABNORMAL HIGH (ref 0–99)
NONHDL: 159.01
Triglycerides: 179 mg/dL — ABNORMAL HIGH (ref 0.0–149.0)
VLDL: 35.8 mg/dL (ref 0.0–40.0)

## 2015-09-17 NOTE — Patient Instructions (Addendum)
BEFORE YOU LEAVE: -labs -follow up in 1 year  We recommend the following healthy lifestyle measures: - eat a healthy whole foods diet consisting of regular small meals composed of vegetables, fruits, beans, nuts, seeds, healthy meats such as white chicken and fish and whole grains.  - avoid sweets, white starchy foods, fried foods, fast food, processed foods, sodas, red meet and other fattening foods.  - get a least 150-300 minutes of aerobic exercise per week.   Adequate intake of dietary calcium (1200mg )  Vit D3 [phone] IU daily  -We have ordered labs or studies at this visit. It can take up to 1-2 weeks for results and processing. We will contact you with instructions IF your results are abnormal. Normal results will be released to your [facility]. If you have not heard from [place] or can not find your results in [facility] in 2 weeks please contact our office.   Please bring a copy of your advanced directives to our office manager

## 2015-09-17 NOTE — Progress Notes (Signed)
Pre visit review using our clinic review tool, if applicable. No additional management support is needed unless otherwise documented below in the visit note. 

## 2015-09-17 NOTE — Progress Notes (Addendum)
Medicare Annual Preventive Care Visit  (initial annual wellness or annual wellness exam) Sees gyn, derm and optho yearly.  Concerns and/or follow up today:  GAD/Panic disorder: -started many years ago -very rarely gets very worked up when caring for her grandaughter with special needs - uses clorazepate (1/2 tablet) rxd by prior PCP rarely for panic attack -denies: frequent symptoms, depression   GERD: -chronic, uses nexium prn -watches diet and does ok -no hx of esophageal stricture or esophagitis  Seasonal allergies: -chronic -uses INS -doing well per her report -worsening symptoms in the spring  Abdominal Hernia: -reports saw Dr. Zella Richer in the past for this - and opted to monitor   ROS: negative for report of fevers, unintentional weight loss, vision changes, vision loss, hearing loss or change, chest pain, sob, hemoptysis, melena, hematochezia, hematuria, genital discharge or lesions, falls, bleeding or bruising, loc, thoughts of suicide or self harm, memory loss  1.) Patient-completed health risk assessment  - completed and reviewed, see scanned documentation  2.) Review of Medical History: -PMH, PSH, Family History and current specialty and care providers reviewed and updated and listed below  - see scanned in document in chart and below  Past Medical History  Diagnosis Date  . Seasonal allergies   . GERD (gastroesophageal reflux disease)   . Asthma     as a child  . GAD (generalized anxiety disorder) 03/12/2015    -with rare panic disorder related to caring for grandaughter   . 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 Spider veins 11/23/2013  . Acute meniscal tear of left knee     treated by Dr Katha Hamming date pending  . Baker's cyst of knee     left knee-treated by Dr French Ana   Family History  Problem Relation Age of Onset  . Colon cancer Neg Hx   . Stomach cancer Neg Hx   . Heart disease Mother   . Diabetes Mother   . Cancer Mother     hodgekins lymphoma  .  Diabetes Sister   . Diabetes Maternal Grandmother     Past Surgical History  Procedure Laterality Date  . Breast biopsy      left  . Breast lumpectomy  8/99    left breast- benign (lipoma)  . Colonoscopy  2005    Dr Lyla Son  . Toe surgery      Social History   Social History  . Marital Status: Married    Spouse Name: N/A  . Number of Children: N/A  . Years of Education: N/A   Occupational History  . Not on file.   Social History Main Topics  . Smoking status: Never Smoker   . Smokeless tobacco: Never Used  . Alcohol Use: 4.8 oz/week    8 Glasses of wine per week  . Drug Use: No  . Sexual Activity:    Partners: Male     Comment: husband vasectomy   Other Topics Concern  . 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 calcium-vitamin D (OSCAL WITH D) 250-125 MG-UNIT per tablet Take 1 tablet by mouth daily.    Marland Kitchen  clorazepate (TRANXENE) 7.5 MG tablet Take 1 tablet (7.5 mg total) by mouth daily as needed. 30 tablet 2  . Cyanocobalamin (VITAMIN B 12 PO) Take by mouth as needed.    Marland Kitchen estradiol (ESTRACE) 0.1 MG/GM vaginal cream 1gm two times weekly 42.5 g 3  . Probiotic Product (PROBIOTIC PO) Take by mouth as needed.    . triamcinolone (NASACORT) 55 MCG/ACT AERO nasal inhaler Place 2 sprays into the nose as needed.    . valACYclovir (VALTREX) 500 MG tablet TAKE 1 TABLET BY MOUTH TWICE A DAY AS NEEDED 30 tablet 3   No current facility-administered medications on file prior to visit.     3.) Review of functional ability and level of safety:  Any difficulty hearing?  NO  History of falling? NO  Any trouble with IADLs - using a phone, using  transportation, grocery shopping, preparing meals, doing housework, doing laundry, taking medications and managing money? NO  Advance Directives? YES  See summary of recommendations in Patient Instructions below.  4.) Physical Exam Filed Vitals:   09/17/15 0804  BP: 138/80  Pulse: 61  Temp: 98.1 F (36.7 C)   Estimated body mass index is 25.08 kg/(m^2) as calculated from the following:   Height as of this encounter: 5\' 6"  (1.676 m).   Weight as of this encounter: 155 lb 4.8 oz (70.444 kg).  EKG (optional): deferred  General: alert, appear well hydrated and in no acute distress  HEENT: visual acuity grossly intact  CV: HRRR  Lungs: CTA bilaterally  Psych: pleasant and cooperative, no obvious depression or anxiety  Cognitive function grossly intact  See patient instructions for recommendations.  Education and counseling regarding the above review of health provided with a plan for the following: -see scanned patient completed form for further details -fall prevention strategies discussed  -healthy lifestyle discussed -importance and resources for completing advanced directives discussed -see patient instructions below for any other recommendations provided  4)The following written screening schedule of preventive measures were reviewed with assessment and plan made per below, orders and patient instructions:      AAA screening n/a     Alcohol screening done     Obesity Screening and counseling done     STI screening (Hep C if born 1946-65) declined except Hep C today     Tobacco Screening done       Pneumococcal (PPSV23 -one dose after 64, one before if risk factors), influenza yearly and hepatitis B vaccines (if high risk - end stage renal disease, IV drugs, homosexual men, live in home for mentally retarded, hemophilia receiving factors) ASSESSMENT/PLAN: UTD      Screening mammograph (yearly if >40) ASSESSMENT/PLAN: tomorrow      Screening Pap smear/pelvic  exam (q2 years) ASSESSMENT/PLAN: does with gyn yearly      Prostate cancer screening ASSESSMENT/PLAN: n/a      Colorectal cancer screening (FOBT yearly or flex sig q4y or colonoscopy q10y or barium enema q4y) ASSESSMENT/PLAN: done      Diabetes outpatient self-management training services ASSESSMENT/PLAN:      Bone mass measurements(covered q2y if indicated - estrogen def, osteoporosis, hyperparathyroid, vertebral abnormalities, osteoporosis or steroids) ASSESSMENT/PLAN: done      Screening for glaucoma(q1y if high risk - diabetes, FH, AA and > 50 or hispanic and > 65) ASSESSMENT/PLAN: sees optho yearly      Medical nutritional therapy for individuals with diabetes or renal disease ASSESSMENT/PLAN: n/a      Cardiovascular screening blood tests (lipids q5y) ASSESSMENT/PLAN:  today      Diabetes screening tests ASSESSMENT/PLAN: today   7.) Summary: -risk factors and conditions per above assessment were discussed and treatment, recommendations and referrals were offered per documentation above and orders and patient instructions.  Medicare annual wellness visit, initial - Plan: Lipid Panel, Hemoglobin A1c, Hep C Antibody  Seasonal allergies  GAD (generalized anxiety disorder)  Patient Instructions  BEFORE YOU LEAVE: -labs -follow up in 1 year  We recommend the following healthy lifestyle measures: - eat a healthy whole foods diet consisting of regular small meals composed of vegetables, fruits, beans, nuts, seeds, healthy meats such as white chicken and fish and whole grains.  - avoid sweets, white starchy foods, fried foods, fast food, processed foods, sodas, red meet and other fattening foods.  - get a least 150-300 minutes of aerobic exercise per week.   Adequate intake of dietary calcium (1200mg )  Vit D3 [phone] IU daily  -We have ordered labs or studies at this visit. It can take up to 1-2 weeks for results and processing. We will contact you with instructions IF  your results are abnormal. Normal results will be released to your [facility]. If you have not heard from [place] or can not find your results in [facility] in 2 weeks please contact our office.   Please bring a copy of your advanced directives to our office manager

## 2015-09-18 ENCOUNTER — Ambulatory Visit
Admission: RE | Admit: 2015-09-18 | Discharge: 2015-09-18 | Disposition: A | Discharge status: Home / Self Care | Payer: PPO | Source: Ambulatory Visit

## 2015-09-18 DIAGNOSIS — Z1231 Encounter for screening mammogram for malignant neoplasm of breast: Secondary | ICD-10-CM

## 2015-09-18 LAB — HEPATITIS C ANTIBODY: HCV Ab: NEGATIVE

## 2015-10-01 ENCOUNTER — Ambulatory Visit: Payer: Self-pay | Admitting: Certified Nurse Midwife

## 2015-10-03 ENCOUNTER — Encounter: Payer: Self-pay | Admitting: Certified Nurse Midwife

## 2015-10-03 ENCOUNTER — Ambulatory Visit (INDEPENDENT_AMBULATORY_CARE_PROVIDER_SITE_OTHER): Payer: PPO | Admitting: Certified Nurse Midwife

## 2015-10-03 VITALS — BP 138/70 | HR 76 | Resp 14 | Ht 66.0 in | Wt 154.0 lb

## 2015-10-03 DIAGNOSIS — Z01419 Encounter for gynecological examination (general) (routine) without abnormal findings: Secondary | ICD-10-CM

## 2015-10-03 DIAGNOSIS — B009 Herpesviral infection, unspecified: Secondary | ICD-10-CM

## 2015-10-03 DIAGNOSIS — N952 Postmenopausal atrophic vaginitis: Secondary | ICD-10-CM

## 2015-10-03 MED ORDER — VALACYCLOVIR HCL 500 MG PO TABS: ORAL_TABLET | ORAL | Status: DC

## 2015-10-03 MED ORDER — ESTRADIOL 0.1 MG/GM VA CREA: TOPICAL_CREAM | VAGINAL | Status: DC

## 2015-10-03 NOTE — Progress Notes (Signed)
64 y.o. G3P2002 Married  Caucasian Fe here for annual exam. Menopausal no HRT. Using Estrace cream for vaginal dryness once weekly, working well. Desires continuance. Recent aex with Colin Benton for labs/aex. Valtrex working well with outbreak, needs refill. Had ruptured Bakers cyst on left leg under follow up with orthopedic. No health issues today. Still golfing!!  Patient's last menstrual period was 10/20/2000.          Sexually active: Yes.    The current method of family planning is post menopausal status.    Exercising: Yes.    golf and tennis, not at the time due to injury//kg Smoker:  no  Health Maintenance: Pap:  09/2014 MMG:  08/2015  BIRADS1:Neg density B Colonoscopy:  07/2014 Normal- Every 10 years  BMD:   10/2013 Normal TDaP:  2013 Shingles: 2012 Pneumonia: 2015 Hep C and HIV: done with PCP negative Labs: PCP   reports that she has never smoked. She has never used smokeless tobacco. She reports that she drinks about 4.8 oz of alcohol per week. She reports that she does not use illicit drugs.  Past Medical History  Diagnosis Date  . Seasonal allergies   . GERD (gastroesophageal reflux disease)   . Asthma     as a child  . GAD (generalized anxiety disorder) 03/12/2015    -with rare panic disorder related to caring for grandaughter   . 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 Spider veins 11/23/2013  . Acute meniscal tear of left knee     treated by Dr Katha Hamming date pending  . Baker's cyst of knee     left knee-treated by Dr French Ana    Past Surgical History  Procedure Laterality Date  . Breast biopsy      left  . Breast lumpectomy  8/99    left breast- benign (lipoma)  . Colonoscopy  2005    Dr Lyla Son  . Toe surgery      Current Outpatient Prescriptions  Medication Sig Dispense Refill  . calcium-vitamin D (OSCAL WITH D) 250-125 MG-UNIT per tablet Take 1 tablet by mouth daily.    . Cyanocobalamin (VITAMIN B 12 PO) Take by mouth as needed.    Marland Kitchen estradiol  (ESTRACE) 0.1 MG/GM vaginal cream 1gm two times weekly 42.5 g 3  . Probiotic Product (PROBIOTIC PO) Take by mouth as needed.    . triamcinolone (NASACORT) 55 MCG/ACT AERO nasal inhaler Place 2 sprays into the nose as needed.    . valACYclovir (VALTREX) 500 MG tablet TAKE 1 TABLET BY MOUTH TWICE A DAY AS NEEDED 30 tablet 3   No current facility-administered medications for this visit.    Family History  Problem Relation Age of Onset  . Colon cancer Neg Hx   . Stomach cancer Neg Hx   . Heart disease Mother   . Diabetes Mother   . Cancer Mother     hodgekins lymphoma  . Diabetes Sister   . Diabetes Maternal Grandmother     ROS:  Pertinent items are noted in HPI.  Otherwise, a comprehensive ROS was negative.  Exam:   BP 138/70 mmHg  Pulse 76  Resp 14  Ht 5\' 6"  (1.676 m)  Wt 154 lb (69.854 kg)  BMI 24.87 kg/m2  LMP 10/20/2000 Height: 5\' 6"  (167.6 cm) Ht Readings from Last 3 Encounters:  10/03/15 5\' 6"  (1.676 m)  09/17/15 5\' 6"  (1.676 m)  06/04/15 5' 5.25" (1.657 m)    General appearance: alert, cooperative and appears stated age  Head: Normocephalic, without obvious abnormality, atraumatic Neck: no adenopathy, supple, symmetrical, trachea midline and thyroid normal to inspection and palpation Lungs: clear to auscultation bilaterally Breasts: normal appearance, no masses or tenderness, No nipple retraction or dimpling, No nipple discharge or bleeding, No axillary or supraclavicular adenopathy Heart: regular rate and rhythm Abdomen: soft, non-tender; no masses,  no organomegaly Extremities: extremities normal, atraumatic, no cyanosis or edema Skin: Skin color, texture, turgor normal. No rashes or lesions Lymph nodes: Cervical, supraclavicular, and axillary nodes normal. No abnormal inguinal nodes palpated Neurologic: Grossly normal   Pelvic: External genitalia:  no lesions              Urethra:  normal appearing urethra with no masses, tenderness or lesions               Bartholin's and Skene's: normal                 Vagina: normal appearing vagina with normal color and discharge, no lesions              Cervix: normal,non tender, no lesions              Pap taken: No. 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

## 2015-10-03 NOTE — Patient Instructions (Signed)

## 2015-10-06 NOTE — Progress Notes (Signed)
Reviewed personally.  M. Suzanne , MD.  

## 2015-11-07 DIAGNOSIS — L820 Inflamed seborrheic keratosis: Secondary | ICD-10-CM | POA: Diagnosis not present

## 2015-11-07 DIAGNOSIS — D225 Melanocytic nevi of trunk: Secondary | ICD-10-CM | POA: Diagnosis not present

## 2015-11-07 DIAGNOSIS — D1801 Hemangioma of skin and subcutaneous tissue: Secondary | ICD-10-CM | POA: Diagnosis not present

## 2015-11-07 DIAGNOSIS — Z23 Encounter for immunization: Secondary | ICD-10-CM | POA: Diagnosis not present

## 2015-11-07 DIAGNOSIS — L814 Other melanin hyperpigmentation: Secondary | ICD-10-CM | POA: Diagnosis not present

## 2015-11-07 DIAGNOSIS — L821 Other seborrheic keratosis: Secondary | ICD-10-CM | POA: Diagnosis not present

## 2015-11-07 DIAGNOSIS — Z85828 Personal history of other malignant neoplasm of skin: Secondary | ICD-10-CM | POA: Diagnosis not present

## 2015-12-19 DIAGNOSIS — M94262 Chondromalacia, left knee: Secondary | ICD-10-CM | POA: Diagnosis not present

## 2015-12-19 DIAGNOSIS — G8918 Other acute postprocedural pain: Secondary | ICD-10-CM | POA: Diagnosis not present

## 2015-12-19 DIAGNOSIS — S83232A Complex tear of medial meniscus, current injury, left knee, initial encounter: Secondary | ICD-10-CM | POA: Diagnosis not present

## 2015-12-19 DIAGNOSIS — S83242A Other tear of medial meniscus, current injury, left knee, initial encounter: Secondary | ICD-10-CM | POA: Diagnosis not present

## 2015-12-24 DIAGNOSIS — M25562 Pain in left knee: Secondary | ICD-10-CM | POA: Diagnosis not present

## 2015-12-24 DIAGNOSIS — S83242D Other tear of medial meniscus, current injury, left knee, subsequent encounter: Secondary | ICD-10-CM | POA: Diagnosis not present

## 2015-12-24 DIAGNOSIS — M2242 Chondromalacia patellae, left knee: Secondary | ICD-10-CM | POA: Diagnosis not present

## 2015-12-24 DIAGNOSIS — M25662 Stiffness of left knee, not elsewhere classified: Secondary | ICD-10-CM | POA: Diagnosis not present

## 2015-12-27 DIAGNOSIS — S83242D Other tear of medial meniscus, current injury, left knee, subsequent encounter: Secondary | ICD-10-CM | POA: Diagnosis not present

## 2015-12-28 DIAGNOSIS — M25562 Pain in left knee: Secondary | ICD-10-CM | POA: Diagnosis not present

## 2015-12-28 DIAGNOSIS — M25662 Stiffness of left knee, not elsewhere classified: Secondary | ICD-10-CM | POA: Diagnosis not present

## 2015-12-28 DIAGNOSIS — M2242 Chondromalacia patellae, left knee: Secondary | ICD-10-CM | POA: Diagnosis not present

## 2015-12-28 DIAGNOSIS — S83242D Other tear of medial meniscus, current injury, left knee, subsequent encounter: Secondary | ICD-10-CM | POA: Diagnosis not present

## 2015-12-31 DIAGNOSIS — M2242 Chondromalacia patellae, left knee: Secondary | ICD-10-CM | POA: Diagnosis not present

## 2015-12-31 DIAGNOSIS — M25662 Stiffness of left knee, not elsewhere classified: Secondary | ICD-10-CM | POA: Diagnosis not present

## 2015-12-31 DIAGNOSIS — S83242D Other tear of medial meniscus, current injury, left knee, subsequent encounter: Secondary | ICD-10-CM | POA: Diagnosis not present

## 2015-12-31 DIAGNOSIS — M25562 Pain in left knee: Secondary | ICD-10-CM | POA: Diagnosis not present

## 2016-01-04 DIAGNOSIS — M25562 Pain in left knee: Secondary | ICD-10-CM | POA: Diagnosis not present

## 2016-01-04 DIAGNOSIS — M2242 Chondromalacia patellae, left knee: Secondary | ICD-10-CM | POA: Diagnosis not present

## 2016-01-04 DIAGNOSIS — M25662 Stiffness of left knee, not elsewhere classified: Secondary | ICD-10-CM | POA: Diagnosis not present

## 2016-01-04 DIAGNOSIS — S83242D Other tear of medial meniscus, current injury, left knee, subsequent encounter: Secondary | ICD-10-CM | POA: Diagnosis not present

## 2016-01-08 DIAGNOSIS — M2242 Chondromalacia patellae, left knee: Secondary | ICD-10-CM | POA: Diagnosis not present

## 2016-01-08 DIAGNOSIS — S83242D Other tear of medial meniscus, current injury, left knee, subsequent encounter: Secondary | ICD-10-CM | POA: Diagnosis not present

## 2016-01-08 DIAGNOSIS — M25562 Pain in left knee: Secondary | ICD-10-CM | POA: Diagnosis not present

## 2016-01-08 DIAGNOSIS — M25662 Stiffness of left knee, not elsewhere classified: Secondary | ICD-10-CM | POA: Diagnosis not present

## 2016-01-10 DIAGNOSIS — S83242D Other tear of medial meniscus, current injury, left knee, subsequent encounter: Secondary | ICD-10-CM | POA: Diagnosis not present

## 2016-01-10 DIAGNOSIS — M25662 Stiffness of left knee, not elsewhere classified: Secondary | ICD-10-CM | POA: Diagnosis not present

## 2016-01-10 DIAGNOSIS — M2242 Chondromalacia patellae, left knee: Secondary | ICD-10-CM | POA: Diagnosis not present

## 2016-01-10 DIAGNOSIS — M25562 Pain in left knee: Secondary | ICD-10-CM | POA: Diagnosis not present

## 2016-01-14 DIAGNOSIS — S83242D Other tear of medial meniscus, current injury, left knee, subsequent encounter: Secondary | ICD-10-CM | POA: Diagnosis not present

## 2016-01-14 DIAGNOSIS — M25662 Stiffness of left knee, not elsewhere classified: Secondary | ICD-10-CM | POA: Diagnosis not present

## 2016-01-14 DIAGNOSIS — M25562 Pain in left knee: Secondary | ICD-10-CM | POA: Diagnosis not present

## 2016-01-14 DIAGNOSIS — M2242 Chondromalacia patellae, left knee: Secondary | ICD-10-CM | POA: Diagnosis not present

## 2016-01-16 DIAGNOSIS — M25662 Stiffness of left knee, not elsewhere classified: Secondary | ICD-10-CM | POA: Diagnosis not present

## 2016-01-16 DIAGNOSIS — M2242 Chondromalacia patellae, left knee: Secondary | ICD-10-CM | POA: Diagnosis not present

## 2016-01-16 DIAGNOSIS — S83242D Other tear of medial meniscus, current injury, left knee, subsequent encounter: Secondary | ICD-10-CM | POA: Diagnosis not present

## 2016-01-16 DIAGNOSIS — M25562 Pain in left knee: Secondary | ICD-10-CM | POA: Diagnosis not present

## 2016-01-17 DIAGNOSIS — Z23 Encounter for immunization: Secondary | ICD-10-CM | POA: Diagnosis not present

## 2016-01-17 DIAGNOSIS — D1723 Benign lipomatous neoplasm of skin and subcutaneous tissue of right leg: Secondary | ICD-10-CM | POA: Diagnosis not present

## 2016-01-17 DIAGNOSIS — S83242D Other tear of medial meniscus, current injury, left knee, subsequent encounter: Secondary | ICD-10-CM | POA: Diagnosis not present

## 2016-01-17 DIAGNOSIS — D1722 Benign lipomatous neoplasm of skin and subcutaneous tissue of left arm: Secondary | ICD-10-CM | POA: Diagnosis not present

## 2016-02-12 ENCOUNTER — Ambulatory Visit (HOSPITAL_COMMUNITY)
Admission: RE | Admit: 2016-02-12 | Discharge: 2016-02-12 | Disposition: A | Discharge status: Home / Self Care | Payer: PPO | Source: Ambulatory Visit | Attending: Cardiovascular Disease | Admitting: Cardiovascular Disease

## 2016-02-12 ENCOUNTER — Other Ambulatory Visit (HOSPITAL_COMMUNITY): Payer: Self-pay | Admitting: Orthopedic Surgery

## 2016-02-12 DIAGNOSIS — K219 Gastro-esophageal reflux disease without esophagitis: Secondary | ICD-10-CM | POA: Insufficient documentation

## 2016-02-12 DIAGNOSIS — M7989 Other specified soft tissue disorders: Secondary | ICD-10-CM | POA: Diagnosis not present

## 2016-02-12 DIAGNOSIS — S83242D Other tear of medial meniscus, current injury, left knee, subsequent encounter: Secondary | ICD-10-CM | POA: Diagnosis not present

## 2016-05-25 DIAGNOSIS — R05 Cough: Secondary | ICD-10-CM | POA: Diagnosis not present

## 2016-05-25 DIAGNOSIS — J069 Acute upper respiratory infection, unspecified: Secondary | ICD-10-CM | POA: Diagnosis not present

## 2016-06-14 ENCOUNTER — Telehealth: Payer: PPO | Admitting: Physician Assistant

## 2016-06-14 DIAGNOSIS — L255 Unspecified contact dermatitis due to plants, except food: Secondary | ICD-10-CM

## 2016-06-14 MED ORDER — PREDNISONE 10 MG (21) PO TBPK: 10.0000 mg | ORAL_TABLET | Freq: Every day | ORAL | 0 refills | Status: DC

## 2016-06-14 NOTE — Progress Notes (Signed)

## 2016-07-16 ENCOUNTER — Ambulatory Visit (INDEPENDENT_AMBULATORY_CARE_PROVIDER_SITE_OTHER): Payer: PPO | Admitting: *Deleted

## 2016-07-16 DIAGNOSIS — Z23 Encounter for immunization: Secondary | ICD-10-CM | POA: Diagnosis not present

## 2016-09-03 ENCOUNTER — Telehealth: Payer: PPO | Admitting: Nurse Practitioner

## 2016-09-03 DIAGNOSIS — B001 Herpesviral vesicular dermatitis: Secondary | ICD-10-CM

## 2016-09-03 MED ORDER — ACYCLOVIR 5 % EX OINT: 1.0000 "application " | TOPICAL_OINTMENT | CUTANEOUS | 0 refills | Status: DC

## 2016-09-03 NOTE — Progress Notes (Signed)
We are sorry that you are not feeling well.  Here is how we plan to help!  Based on what you have shared with me it does look like you have a viral infection.    Most cold sores or fever blisters are small fluid filled blisters around the mouth caused by herpes simplex virus.  The most common strain of the virus causing cold sores is herpes simplex virus 1.  It can be spread by skin contact, sharing eating utensils, or even sharing towels.  Cold sores are contagious to other people until dry. (Approximately 5-7 days).  Wash your hands. You can spread the virus to your eyes through handling your contact lenses after touching the lesions.  Most people experience pain at the sight or tingling sensations in their lips that may begin before the ulcers erupt.  Herpes simplex is treatable but not curable.  It may lie dormant for a long time and then reappear due to stress or prolonged sun exposure.  Many patients have success in treating their cold sores with an over the counter topical called Abreva.  You may apply the cream up to 5 times daily (maximum 10 days) until healing occurs.  If you would like to use an oral antiviral medication to speed the healing of your cold sore, I have sent a prescription to your local pharmacy zovirax ointment- insurance may not pay for this- if not will have to try something OTC.    HOME CARE:   Wash your hands frequently.  Do not pick at or rub the sore.  Don't open the blisters.  Avoid kissing other people during this time.  Avoid sharing drinking glasses, eating utensils, or razors.  Do not handle contact lenses unless you have thoroughly washed your hands with soap and warm water!  Avoid oral sex during this time.  Herpes from sores on your mouth can spread to your partner's genital area.  Avoid contact with anyone who has eczema or a weakened immune system.  Cold sores are often triggered by exposure to intense sunlight, use a lip balm containing a  sunscreen (SPF 30 or higher).  GET HELP RIGHT AWAY IF:   Blisters look infected.  Blisters occur near or in the eye.  Symptoms last longer than 10 days.  Your symptoms become worse.  MAKE SURE YOU:   Understand these instructions.  Will watch your condition.  Will get help right away if you are not doing well or get worse.    Your e-visit answers were reviewed by a board certified advanced clinical practitioner to complete your personal care plan.  Depending upon the condition, your plan could have  Included both over the counter or prescription medications.    Please review your pharmacy choice.  Be sure that the pharmacy you have chosen is open so that you can pick up your prescription now.  If there is a problem you csn message your provider in [place] to have the prescription routed to another pharmacy.    Your safety is important to [place].  If you have drug allergies check our prescription carefully.  For the next 24 hours you can use MyChart to ask questions about today's visit, request a non-urgent call back, or ask for a work or school excuse from your e-visit provider.  You will get an email in the next two days asking about your experience.  I hope that your e-visit has been valuable and will speed your recovery.

## 2016-09-23 ENCOUNTER — Other Ambulatory Visit: Payer: Self-pay | Admitting: Certified Nurse Midwife

## 2016-09-23 DIAGNOSIS — Z1231 Encounter for screening mammogram for malignant neoplasm of breast: Secondary | ICD-10-CM

## 2016-10-03 ENCOUNTER — Ambulatory Visit: Payer: PPO | Admitting: Certified Nurse Midwife

## 2016-10-09 ENCOUNTER — Encounter: Payer: Self-pay | Admitting: Certified Nurse Midwife

## 2016-10-09 ENCOUNTER — Ambulatory Visit (INDEPENDENT_AMBULATORY_CARE_PROVIDER_SITE_OTHER): Payer: PPO | Admitting: Certified Nurse Midwife

## 2016-10-09 VITALS — BP 120/70 | HR 72 | Resp 16 | Ht 65.0 in | Wt 154.0 lb

## 2016-10-09 DIAGNOSIS — B009 Herpesviral infection, unspecified: Secondary | ICD-10-CM

## 2016-10-09 DIAGNOSIS — Z01419 Encounter for gynecological examination (general) (routine) without abnormal findings: Secondary | ICD-10-CM

## 2016-10-09 DIAGNOSIS — Z0000 Encounter for general adult medical examination without abnormal findings: Secondary | ICD-10-CM

## 2016-10-09 DIAGNOSIS — Z124 Encounter for screening for malignant neoplasm of cervix: Secondary | ICD-10-CM

## 2016-10-09 LAB — POCT URINALYSIS DIPSTICK
BILIRUBIN UA: NEGATIVE
Glucose, UA: NEGATIVE
Ketones, UA: NEGATIVE
LEUKOCYTES UA: NEGATIVE
NITRITE UA: NEGATIVE
PH UA: 5
PROTEIN UA: NEGATIVE
RBC UA: NEGATIVE
UROBILINOGEN UA: NEGATIVE

## 2016-10-09 NOTE — Progress Notes (Signed)
65 y.o. G81P2002 Married  Caucasian Fe here for annual exam. Menopausal no HRT. Has stopped Estrace cream, doesn't feel she needs it. Uses moisturizer for sexually activity with no issues. Sees Colin Benton yearly for aex, labs. All normal per patient. Has had dull ache in back and has been treated by [facility], but still occurring. Plans to see PCP regarding. Still playing tennis and now Golf. No other health issues today.  Patient's last menstrual period was 10/20/2000.          Sexually active: Yes.    The current method of family planning  is vasectomy.    Exercising: Yes.    walking & golf Smoker:  no  Health Maintenance: Pap:  09-28-14 neg MMG:  09-18-15 category b density birads 1:neg, scheduled already Colonoscopy:  2015 neg 72yrs BMD:   2015 TDaP:  2013 Shingles: 2012 Pneumonia: 2017 Hep C and HIV: Hep c neg 2016 Labs: poct urine-neg Self breast exam: done monthly   reports that she has never smoked. She has never used smokeless tobacco. She reports that she drinks about 4.8 oz of alcohol per week . She reports that she does not use drugs.  Past Medical History:  Diagnosis Date  . Acute meniscal tear of left knee    treated by Dr Katha Hamming date pending  . Asthma    as a child  . Baker's cyst of knee    left knee-treated by Dr French Ana  . GAD (generalized anxiety disorder) 03/12/2015   -with rare panic disorder related to caring for grandaughter   . GERD (gastroesophageal reflux disease)   . 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 Seasonal allergies   . Spider veins 11/23/2013    Past Surgical History:  Procedure Laterality Date  . BREAST BIOPSY     left  . BREAST LUMPECTOMY  8/99   left breast- benign (lipoma)  . COLONOSCOPY  2005   Dr Lyla Son  . TOE SURGERY      Current Outpatient Prescriptions  Medication Sig Dispense Refill  . acyclovir ointment (ZOVIRAX) 5 % Apply 1 application topically every 3 (three) hours. 15 g 0  . calcium-vitamin D (OSCAL WITH D) 250-125  MG-UNIT per tablet Take 1 tablet by mouth daily.    . Cyanocobalamin (VITAMIN B 12 PO) Take by mouth as needed.    Marland Kitchen estradiol (ESTRACE) 0.1 MG/GM vaginal cream 1gm two times weekly 42.5 g 4  . Probiotic Product (PROBIOTIC PO) Take by mouth as needed.    . triamcinolone (NASACORT) 55 MCG/ACT AERO nasal inhaler Place 2 sprays into the nose as needed.    . valACYclovir (VALTREX) 500 MG tablet TAKE 1 TABLET BY MOUTH TWICE A DAY AS NEEDED 30 tablet 3   No current facility-administered medications for this visit.     Family History  Problem Relation Age of Onset  . Heart disease Mother   . Diabetes Mother   . Cancer Mother     hodgekins lymphoma  . Diabetes Sister   . Diabetes Maternal Grandmother   . Colon cancer Neg Hx   . Stomach cancer Neg Hx     ROS:  Pertinent items are noted in HPI.  Otherwise, a comprehensive ROS was negative.  Exam:   BP 120/70   Pulse 72   Resp 16   Ht 5\' 5"  (1.651 m)   Wt 154 lb (69.9 kg)   LMP 10/20/2000   BMI 25.63 kg/m  Height: 5\' 5"  (165.1 cm) Ht Readings from Last  3 Encounters:  10/09/16 5\' 5"  (1.651 m)  10/03/15 5\' 6"  (1.676 m)  09/17/15 5\' 6"  (1.676 m)    General appearance: alert, cooperative and appears stated age Head: Normocephalic, without obvious abnormality, atraumatic Neck: no adenopathy, supple, symmetrical, trachea midline and thyroid normal to inspection and palpation Lungs: clear to auscultation bilaterally Breasts: normal appearance, no masses or tenderness, No nipple retraction or dimpling, No nipple discharge or bleeding, No axillary or supraclavicular adenopathy Heart: regular rate and rhythm Abdomen: soft, non-tender; no masses,  no organomegaly Extremities: extremities normal, atraumatic, no cyanosis or edema Skin: Skin color, texture, turgor normal. No rashes or lesions Lymph nodes: Cervical, supraclavicular, and axillary nodes normal. No abnormal inguinal nodes palpated Neurologic: Grossly normal   Pelvic: External  genitalia:  no lesions              Urethra:  normal appearing urethra with no masses, tenderness or lesions              Bartholin's and Skene's: normal                 Vagina: normal appearing vagina with normal color and discharge, no lesions              Cervix: no cervical motion tenderness, no lesions and normal appearance              Pap taken: no cervical motion tenderness, no lesions and normal appearance Bimanual Exam:  Uterus:  normal size, contour, position, consistency, mobility, non-tender              Adnexa: normal adnexa and no mass, fullness, tenderness               Rectovaginal: Confirms               Anus:  normal sphincter tone, no lesions  Chaperone present: yes  A:  Well Woman with normal exam  Menopausal no HRT  Declines estrace Rx for vaginal dryness, not using now.  Plans visit for back evaluation with PCP.  Mammogram scheduled  P:   Reviewed health and wellness pertinent to exam  Aware of need to evaluate if vaginal bleeding.  Will advise if status changes. Discussed coconut oil use also for vaginal moisture.  Pap smear as above    counseled on breast self exam, mammography screening, adequate intake of calcium and vitamin D, diet and exercise  return annually or prn  An After Visit Summary was printed and given to the patient.

## 2016-10-09 NOTE — Patient Instructions (Signed)

## 2016-10-10 ENCOUNTER — Other Ambulatory Visit: Payer: Self-pay | Admitting: Nurse Practitioner

## 2016-10-10 ENCOUNTER — Telehealth: Payer: Self-pay | Admitting: Certified Nurse Midwife

## 2016-10-10 DIAGNOSIS — Z780 Asymptomatic menopausal state: Secondary | ICD-10-CM

## 2016-10-10 DIAGNOSIS — E2839 Other primary ovarian failure: Secondary | ICD-10-CM

## 2016-10-10 NOTE — Telephone Encounter (Signed)
Left detailed message at number provided [phone], okay per ROI. Advised order has been placed for her to have BMD at the [facility] and she may contact their office at her earliest convenience to schedule. Advised to return call to the office with any further questions.  Routing to provider for final review. Patient agreeable to disposition. Will close encounter.

## 2016-10-10 NOTE — Telephone Encounter (Signed)
Patient requesting bone density order be sent to [place]

## 2016-10-10 NOTE — Telephone Encounter (Signed)
Order to Kem Boroughs, NP for signature.

## 2016-10-10 NOTE — Progress Notes (Signed)
Encounter reviewed  , MD   

## 2016-10-10 NOTE — Addendum Note (Signed)
Addended by: Jasmine Awe on: 10/10/2016 02:44 PM   Modules accepted: Orders

## 2016-10-10 NOTE — Telephone Encounter (Signed)
Patient needs a referral for bone density test

## 2016-10-10 NOTE — Telephone Encounter (Signed)
Order for BMD faxed with cover sheet with [facility]. Left detailed message at number provided [phone], okay per ROI. Advised order has been faxed and she may contact [facility] at her earliest convenience to schedule her appointment. Advised to return call to the office with any further questions,  Routing to provider for final review. Patient agreeable to disposition. Will close encounter.

## 2016-10-14 ENCOUNTER — Ambulatory Visit
Admission: RE | Admit: 2016-10-14 | Discharge: 2016-10-14 | Disposition: A | Discharge status: Home / Self Care | Payer: PPO | Source: Ambulatory Visit | Attending: Certified Nurse Midwife | Admitting: Certified Nurse Midwife

## 2016-10-14 DIAGNOSIS — Z1231 Encounter for screening mammogram for malignant neoplasm of breast: Secondary | ICD-10-CM

## 2016-10-14 LAB — IPS PAP SMEAR ONLY

## 2016-10-22 ENCOUNTER — Ambulatory Visit
Admission: RE | Admit: 2016-10-22 | Discharge: 2016-10-22 | Disposition: A | Discharge status: Home / Self Care | Payer: PPO | Source: Ambulatory Visit | Attending: Nurse Practitioner | Admitting: Nurse Practitioner

## 2016-10-22 DIAGNOSIS — Z780 Asymptomatic menopausal state: Secondary | ICD-10-CM | POA: Diagnosis not present

## 2016-10-22 DIAGNOSIS — M85852 Other specified disorders of bone density and structure, left thigh: Secondary | ICD-10-CM | POA: Diagnosis not present

## 2016-10-22 DIAGNOSIS — E2839 Other primary ovarian failure: Secondary | ICD-10-CM

## 2016-10-23 ENCOUNTER — Telehealth: Payer: Self-pay | Admitting: Family Medicine

## 2016-10-23 NOTE — Telephone Encounter (Signed)
[facility] - Client [facility] Patient Name: [name] DOB: Jul 15, 1951 Initial Comment Caller states, she almost passed out - having middle back pain on right side. Has had it before Christmas. Nurse Assessment Nurse: Markus Daft, RN, Sherre Poot Date/Time (Eastern Time): 10/23/2016 10:12:01 AM Confirm and document reason for call. If symptomatic, describe symptoms. ---Caller states that she has mid back pain on right side, and caused her to almost passed out this AM. Tried Hydrocodone during the night. Did help her sleep, but then woke up around 4 am with the pain again. S/S since before Christmas. Chiropractor has not helped. Does the patient have any new or worsening symptoms? ---Yes Will a triage be completed? ---Yes Related visit to physician within the last 2 weeks? ---Yes Does the PT have any chronic conditions? (i.e. diabetes, asthma, etc.) ---Yes List chronic conditions. ---chronic back pain Is this a behavioral health or substance abuse call? ---No Guidelines Guideline Title Affirmed Question Affirmed Notes Back Pain [1] SEVERE back pain (e.g., excruciating, unable to do any normal activities) AND [2] not improved 2 hours after pain medicine Final Disposition User See Physician within 4 Hours (or PCP triage) Markus Daft, RN, Sherre Poot Comments She plans to go to chiropractor again. X-rays were done there. May go to orthopedic doctor. Referrals GO TO FACILITY UNDECIDED Disagree/Comply: Comply

## 2016-10-23 NOTE — Telephone Encounter (Signed)
Spoke with pt and she c/o several weeks of right flank/back pain. She reports that it comes and goes. She did take pain medication last night to help her sleep. She has not taken muscle relaxer, but does have some. She was up fixing breakfast this morning and got nauseous and sweaty. She felt like she was going to pass out but did not. She is planning to see her chiropractor this afternoon. Once she has recommendations from him she will decide how to proceed. Advised pt to contact office if she decides she needs an appt. Nothing further needed at this time.

## 2016-10-28 ENCOUNTER — Telehealth: Payer: Self-pay

## 2016-10-28 DIAGNOSIS — E559 Vitamin D deficiency, unspecified: Secondary | ICD-10-CM

## 2016-10-28 NOTE — Telephone Encounter (Signed)
-----   Message from Kem Boroughs, [place] sent at 10/24/2016  1:22 PM EST ----- Results via my chart:  Note to DL and [name],  My name is Erin Circle, FNP and I am reviewing your Bone Density test for [name], CNM.  The Bone Density test done on 10/22/16 shows a T Score at the spine 1.2 and left hip at -1.3.  The hip is the lowest range and is at the low normal or upper Osteopenic range.  In comparison to previous study 11/11/13 is not completely accurate since the study was on a different machine.  But in 2015 the T Score of the spine was 0.5 and now at 1.2 - most likely from degenerative changes of the spine.  The left hip in 2015 was -0.7 compared to now at -1.3 - so there is a loss.  The right hip in 2015 was -1.2 but not done on the latest study.  While some bone loss is normal and expectant in postmenopausal women, we do not want to see further loss. You are taking calcium plus Vit D but we need for you to return here for a Vit D blood test.  We are trying to make sure the OTC dose is adequate.  You must do more walking that your usual routine of golf and tennis.  Start a walking program 3 times a week of doing a brisk walk - this may be indoors or outdoors.  When you return from the walk do some upper body exercises with weights. You may also see a personal trainer at the gym to get more information about other types of exercises.  Joy, [name]'s nurse will call you to get the Vit D blood test done. (this was not done at PCP recent labs 08/2015).

## 2016-10-28 NOTE — Telephone Encounter (Signed)
Left message for patient to call & schedule vit d recheck appt.

## 2016-10-30 ENCOUNTER — Other Ambulatory Visit: Payer: PPO

## 2016-10-31 ENCOUNTER — Other Ambulatory Visit (INDEPENDENT_AMBULATORY_CARE_PROVIDER_SITE_OTHER): Payer: PPO

## 2016-10-31 DIAGNOSIS — E559 Vitamin D deficiency, unspecified: Secondary | ICD-10-CM | POA: Diagnosis not present

## 2016-11-01 LAB — VITAMIN D 25 HYDROXY (VIT D DEFICIENCY, FRACTURES): VIT D 25 HYDROXY: 36 ng/mL (ref 30–100)

## 2016-11-11 DIAGNOSIS — L814 Other melanin hyperpigmentation: Secondary | ICD-10-CM | POA: Diagnosis not present

## 2016-11-11 DIAGNOSIS — D225 Melanocytic nevi of trunk: Secondary | ICD-10-CM | POA: Diagnosis not present

## 2016-11-11 DIAGNOSIS — Z23 Encounter for immunization: Secondary | ICD-10-CM | POA: Diagnosis not present

## 2016-11-11 DIAGNOSIS — L821 Other seborrheic keratosis: Secondary | ICD-10-CM | POA: Diagnosis not present

## 2016-11-11 DIAGNOSIS — D1801 Hemangioma of skin and subcutaneous tissue: Secondary | ICD-10-CM | POA: Diagnosis not present

## 2017-04-13 DIAGNOSIS — S83242D Other tear of medial meniscus, current injury, left knee, subsequent encounter: Secondary | ICD-10-CM | POA: Diagnosis not present

## 2017-04-23 DIAGNOSIS — M25562 Pain in left knee: Secondary | ICD-10-CM | POA: Diagnosis not present

## 2017-04-28 DIAGNOSIS — M25562 Pain in left knee: Secondary | ICD-10-CM | POA: Diagnosis not present

## 2017-06-06 NOTE — Progress Notes (Signed)
Medicare Annual Preventive Care Visit  (initial annual wellness or annual wellness exam)  Concerns and/or follow up today: Does gyn exam with Melvia Heaps yearly. Did skin exam with dermatolgist Due for labs and flu vaccine in Oct.  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 Zella Richer in the past for this - and opted to monitor  See HM section in Epic for other details of completed HM. See scanned documentation under Media Tab for further documentation HPI, health risk assessment. See Media Tab and Care Teams sections in Epic for other providers.  ROS: negative for report of fevers, unintentional weight loss, vision changes, vision loss, hearing loss or change, chest pain, sob, hemoptysis, melena, hematochezia, hematuria, genital discharge or lesions, falls, bleeding or bruising, loc, thoughts of suicide or self harm, memory loss  1.) Patient-completed health risk assessment  - completed and reviewed, see scanned documentation  2.) Review of Medical History: -PMH, PSH, Family History and current specialty and care providers reviewed and updated and listed below  - see scanned in document in chart and below  Past Medical History:  Diagnosis Date  . Acute meniscal tear of left knee    treated by Dr Katha Hamming date pending  . Asthma    as a child  . Baker's cyst of knee    left knee-treated by Dr French Ana  . GAD (generalized anxiety disorder)  03/12/2015   -with rare panic disorder related to caring for grandaughter   . GERD (gastroesophageal reflux disease)   . 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 Seasonal allergies   . Spider veins 11/23/2013    Past Surgical History:  Procedure Laterality Date  . BREAST BIOPSY     left  . BREAST LUMPECTOMY  8/99   left breast- benign (lipoma)  . COLONOSCOPY  2005   Dr Lyla Son  . TOE SURGERY      Social History   Social History  . Marital status: Married    Spouse name: N/A  . Number of children: N/A  . Years of education: N/A   Occupational History  . Not on file.   Social History Main Topics  . Smoking status: Never Smoker  . Smokeless tobacco: Never Used  . Alcohol use 4.8 oz/week    8 Glasses of wine per week  . Drug use: No  . Sexual activity: Yes    Partners: Male     Comment: husband vasectomy   Other Topics Concern  . 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 Heart disease Mother   . Diabetes Mother   . Cancer Mother  hodgekins lymphoma  . Diabetes Sister   . Diabetes Maternal Grandmother   . Colon cancer Neg Hx   . Stomach cancer Neg Hx     Current Outpatient Prescriptions on File Prior to Visit  Medication Sig Dispense Refill  . acyclovir ointment (ZOVIRAX) 5 % Apply 1 application topically every 3 (three) hours. 15 g 0  . calcium-vitamin D (OSCAL WITH D) 250-125 MG-UNIT per tablet Take 1 tablet by mouth daily.    . Cyanocobalamin (VITAMIN B 12 PO) Take by mouth as needed.    . Probiotic Product (PROBIOTIC PO) Take by mouth as needed.    . triamcinolone (NASACORT) 55 MCG/ACT AERO nasal inhaler Place 2 sprays into the nose as needed.    . valACYclovir (VALTREX) 500 MG  tablet TAKE 1 TABLET BY MOUTH TWICE A DAY AS NEEDED 30 tablet 3   No current facility-administered medications on file prior to visit.      3.) Review of functional ability and level of safety:  Any difficulty hearing?  See scanned documentation  History of falling?  See scanned documentation  Any trouble with IADLs - using a phone, using transportation, grocery shopping, preparing meals, doing housework, doing laundry, taking medications and managing money?  See scanned documentation  Advance Directives?  has both. See summary of recommendations in Patient Instructions below.  4.) Physical Exam Vitals:   06/08/17 0938  BP: 122/70  Pulse: (!) 59  Temp: 98.4 F (36.9 C)   Estimated body mass index is 25.03 kg/m as calculated from the following:   Height as of this encounter: 5' 5.75" (1.67 m).   Weight as of this encounter: 153 lb 14.4 oz (69.8 kg).  EKG (optional): deferred  General: alert, appear well hydrated and in no acute distress  HEENT: visual acuity grossly intact  CV: HRRR  Lungs: CTA bilaterally  Psych: pleasant and cooperative, no obvious depression or anxiety  Cognitive function grossly intact  See patient instructions for recommendations.  Education and counseling regarding the above review of health provided with a plan for the following: -see scanned patient completed form for further details -fall prevention strategies discussed  -healthy lifestyle discussed -importance and resources for completing advanced directives discussed -see patient instructions below for any other recommendations provided  4)The following written screening schedule of preventive measures were reviewed with assessment and plan made per below, orders and patient instructions:      AAA screening done if applicable     Alcohol screening done     Obesity Screening and counseling done     STI screening (Hep C if born 1946-65) offered and per pt wishes     Tobacco  Screening done done       Pneumococcal (PPSV23 -one dose after 64, one before if risk factors), influenza yearly and hepatitis B vaccines (if high risk - end stage renal disease, IV drugs, homosexual men, live in home for mentally retarded, hemophilia receiving factors) ASSESSMENT/PLAN: done       Screening mammograph (yearly if >40) ASSESSMENT/PLAN: utd       Screening Pap smear/pelvic exam (q2 years) ASSESSMENT/PLAN: n/a, declined does with gyn      Colorectal cancer screening (FOBT yearly or flex sig q4y or colonoscopy q10y or barium enema q4y) ASSESSMENT/PLAN: utd; completed 07/2014, normal, 10 year advised, with Delfin Edis      Diabetes outpatient self-management training services ASSESSMENT/PLAN: utd or done      Bone mass measurements(covered q2y if indicated - estrogen def, osteoporosis,  hyperparathyroid, vertebral abnormalities, osteoporosis or steroids) ASSESSMENT/PLAN: utd, done 10/2016 with gyn      Screening for glaucoma(q1y if high risk - diabetes, FH, AA and > 50 or hispanic and > 65) ASSESSMENT/PLAN: utd or advised      Medical nutritional therapy for individuals with diabetes or renal disease ASSESSMENT/PLAN: see orders      Cardiovascular screening blood tests (lipids q5y) ASSESSMENT/PLAN: see orders and labs      Diabetes screening tests ASSESSMENT/PLAN: see orders and labs   7.) Summary:   Medicare annual wellness visit, subsequent - Plan: Lipid panel, Hemoglobin A1c -risk factors and conditions per above assessment were discussed and treatment, recommendations and referrals were offered per documentation above and orders and patient instructions.  Senile purpura ([place]), Chronic -stable  Mood disorder ([place]), Chronic -resolved, stable  Patient Instructions   BEFORE YOU LEAVE: -follow up: yearly for Wellness Visit -set up flu shot -labs   Ms. Farquhar , Thank you for taking time to come for your Medicare Wellness Visit. I appreciate your ongoing  commitment to your health goals. Please review the following plan we discussed and let me know if I can assist you in the future.   These are the goals we discussed: Goals    Labs today Flu shot in September or October      This is a list of the screening recommended for you and due dates:  Health Maintenance  Topic Date Due  . Flu Shot  05/20/2017  . Mammogram  10/14/2018  . Tetanus Vaccine  10/20/2021  . Colon Cancer Screening  08/04/2024  . DEXA scan (bone density measurement)  Completed  .  Hepatitis C: One time screening is recommended by Center for Disease Control  (CDC) for  adults born from [birth year] through 1966.   Completed  . Pneumonia vaccines  Completed    Health Maintenance for Postmenopausal Women Menopause is a normal process in which your reproductive ability comes to an end. This process happens gradually over a span of months to years, usually between the ages of 38 and 35. Menopause is complete when you have missed 12 consecutive menstrual periods. It is important to talk with your health care provider about some of the most common conditions that affect postmenopausal women, such as heart disease, cancer, and bone loss (osteoporosis). Adopting a healthy lifestyle and getting preventive care can help to promote your health and wellness. Those actions can also lower your chances of developing some of these common conditions. What should I know about menopause? During menopause, you may experience a number of symptoms, such as:  Moderate-to-severe hot flashes.  Night sweats.  Decrease in sex drive.  Mood swings.  Headaches.  Tiredness.  Irritability.  Memory problems.  Insomnia.  Choosing to treat or not to treat menopausal changes is an individual decision that you make with your health care provider. What should I know about hormone replacement therapy and supplements? Hormone therapy products are effective for treating symptoms that are associated with  menopause, such as hot flashes and night sweats. Hormone replacement carries certain risks, especially as you become older. If you are thinking about using estrogen or estrogen with progestin treatments, discuss the benefits and risks with your health care provider. What should I know about heart disease and stroke? Heart disease, heart attack, and stroke become more likely as you age. This may be due, in part, to the hormonal changes that your body experiences during menopause. These can affect how your body  processes dietary fats, triglycerides, and cholesterol. Heart attack and stroke are both medical emergencies. There are many things that you can do to help prevent heart disease and stroke:  Have your blood pressure checked at least every 1-2 years. High blood pressure causes heart disease and increases the risk of stroke.  If you are 65-64 years old, ask your health care provider if you should take aspirin to prevent a heart attack or a stroke.  Do not use any tobacco products, including cigarettes, chewing tobacco, or electronic cigarettes. If you need help quitting, ask your health care provider.  It is important to eat a healthy diet and maintain a healthy weight. ? Be sure to include plenty of vegetables, fruits, low-fat dairy products, and lean protein. ? Avoid eating foods that are high in solid fats, added sugars, or salt (sodium).  Get regular exercise. This is one of the most important things that you can do for your health. ? Try to exercise for at least 150 minutes each week. The type of exercise that you do should increase your heart rate and make you sweat. This is known as moderate-intensity exercise. ? Try to do strengthening exercises at least twice each week. Do these in addition to the moderate-intensity exercise.  Know your numbers.Ask your health care provider to check your cholesterol and your blood glucose. Continue to have your blood tested as directed by your health  care provider.  What should I know about cancer screening? There are several types of cancer. Take the following steps to reduce your risk and to catch any cancer development as early as possible. Breast Cancer  Practice breast self-awareness. ? This means understanding how your breasts normally appear and feel. ? It also means doing regular breast self-exams. Let your health care provider know about any changes, no matter how small.  If you are 74 or older, have a clinician do a breast exam (clinical breast exam or CBE) every year. Depending on your age, family history, and medical history, it may be recommended that you also have a yearly breast X-ray (mammogram).  If you have a family history of breast cancer, talk with your health care provider about genetic screening.  If you are at high risk for breast cancer, talk with your health care provider about having an MRI and a mammogram every year.  Breast cancer (BRCA) gene test is recommended for women who have family members with BRCA-related cancers. Results of the assessment will determine the need for genetic counseling and BRCA1 and for BRCA2 testing. BRCA-related cancers include these types: ? Breast. This occurs in males or females. ? Ovarian. ? Tubal. This may also be called fallopian tube cancer. ? Cancer of the abdominal or pelvic lining (peritoneal cancer). ? Prostate. ? Pancreatic.  Cervical, Uterine, and Ovarian Cancer Your health care provider may recommend that you be screened regularly for cancer of the pelvic organs. These include your ovaries, uterus, and vagina. This screening involves a pelvic exam, which includes checking for microscopic changes to the surface of your cervix (Pap test).  For women ages 21-65, health care providers may recommend a pelvic exam and a Pap test every three years. For women ages 73-65, they may recommend the Pap test and pelvic exam, combined with testing for human papilloma virus (HPV),  every five years. Some types of HPV increase your risk of cervical cancer. Testing for HPV may also be done on women of any age who have unclear Pap test results.  Other health care providers may not recommend any screening for nonpregnant women who are considered low risk for pelvic cancer and have no symptoms. Ask your health care provider if a screening pelvic exam is right for you.  If you have had past treatment for cervical cancer or a condition that could lead to cancer, you need Pap tests and screening for cancer for at least 20 years after your treatment. If Pap tests have been discontinued for you, your risk factors (such as having a new sexual partner) need to be reassessed to determine if you should start having screenings again. Some women have medical problems that increase the chance of getting cervical cancer. In these cases, your health care provider may recommend that you have screening and Pap tests more often.  If you have a family history of uterine cancer or ovarian cancer, talk with your health care provider about genetic screening.  If you have vaginal bleeding after reaching menopause, tell your health care provider.  There are currently no reliable tests available to screen for ovarian cancer.  Lung Cancer Lung cancer screening is recommended for adults 77-78 years old who are at high risk for lung cancer because of a history of smoking. A yearly low-dose CT scan of the lungs is recommended if you:  Currently smoke.  Have a history of at least 30 pack-years of smoking and you currently smoke or have quit within the past 15 years. A pack-year is smoking an average of one pack of cigarettes per day for one year.  Yearly screening should:  Continue until it has been 15 years since you quit.  Stop if you develop a health problem that would prevent you from having lung cancer treatment.  Colorectal Cancer  This type of cancer can be detected and can often be  prevented.  Routine colorectal cancer screening usually begins at age 96 and continues through age 62.  If you have risk factors for colon cancer, your health care provider may recommend that you be screened at an earlier age.  If you have a family history of colorectal cancer, talk with your health care provider about genetic screening.  Your health care provider may also recommend using home test kits to check for hidden blood in your stool.  A small camera at the end of a tube can be used to examine your colon directly (sigmoidoscopy or colonoscopy). This is done to check for the earliest forms of colorectal cancer.  Direct examination of the colon should be repeated every 5-10 years until age 82. However, if early forms of precancerous polyps or small growths are found or if you have a family history or genetic risk for colorectal cancer, you may need to be screened more often.  Skin Cancer  Check your skin from head to toe regularly.  Monitor any moles. Be sure to tell your health care provider: ? About any new moles or changes in moles, especially if there is a change in a mole's shape or color. ? If you have a mole that is larger than the size of a pencil eraser.  If any of your family members has a history of skin cancer, especially at a young age, talk with your health care provider about genetic screening.  Always use sunscreen. Apply sunscreen liberally and repeatedly throughout the day.  Whenever you are outside, protect yourself by wearing long sleeves, pants, a wide-brimmed hat, and sunglasses.  What should I know about osteoporosis? Osteoporosis is a condition in  which bone destruction happens more quickly than new bone creation. After menopause, you may be at an increased risk for osteoporosis. To help prevent osteoporosis or the bone fractures that can happen because of osteoporosis, the following is recommended:  If you are 28-5 years old, get at least 1,000 mg of  calcium and at least 600 mg of vitamin D per day.  If you are older than age 68 but younger than age 68, get at least 1,200 mg of calcium and at least 600 mg of vitamin D per day.  If you are older than age 45, get at least 1,200 mg of calcium and at least 800 mg of vitamin D per day.  Smoking and excessive alcohol intake increase the risk of osteoporosis. Eat foods that are rich in calcium and vitamin D, and do weight-bearing exercises several times each week as directed by your health care provider. What should I know about how menopause affects my mental health? Depression may occur at any age, but it is more common as you become older. Common symptoms of depression include:  Low or sad mood.  Changes in sleep patterns.  Changes in appetite or eating patterns.  Feeling an overall lack of motivation or enjoyment of activities that you previously enjoyed.  Frequent crying spells.  Talk with your health care provider if you think that you are experiencing depression. What should I know about immunizations? It is important that you get and maintain your immunizations. These include:  Tetanus, diphtheria, and pertussis (Tdap) booster vaccine.  Influenza every year before the flu season begins.  Pneumonia vaccine.  Shingles vaccine.  Your health care provider may also recommend other immunizations. This information is not intended to replace advice given to you by your health care provider. Make sure you discuss any questions you have with your health care provider. Document Released: 11/28/2005 Document Revised: 04/25/2016 Document Reviewed: 07/10/2015 [employer] Interactive Patient Education  2018 [place] [place]

## 2017-06-08 ENCOUNTER — Ambulatory Visit (INDEPENDENT_AMBULATORY_CARE_PROVIDER_SITE_OTHER): Payer: PPO | Admitting: Family Medicine

## 2017-06-08 ENCOUNTER — Encounter: Payer: Self-pay | Admitting: Family Medicine

## 2017-06-08 VITALS — BP 122/70 | HR 59 | Temp 98.4°F | Ht 65.75 in | Wt 153.9 lb

## 2017-06-08 DIAGNOSIS — D692 Other nonthrombocytopenic purpura: Secondary | ICD-10-CM | POA: Diagnosis not present

## 2017-06-08 DIAGNOSIS — Z0000 Encounter for general adult medical examination without abnormal findings: Secondary | ICD-10-CM

## 2017-06-08 DIAGNOSIS — F39 Unspecified mood [affective] disorder: Secondary | ICD-10-CM | POA: Insufficient documentation

## 2017-06-08 LAB — LIPID PANEL
Cholesterol: 222 mg/dL — ABNORMAL HIGH (ref 0–200)
HDL: 65.6 mg/dL (ref >39.00)
NONHDL: 156.74
Total CHOL/HDL Ratio: 3
Triglycerides: 205 mg/dL — ABNORMAL HIGH (ref 0.0–149.0)
VLDL: 41 mg/dL — ABNORMAL HIGH (ref 0.0–40.0)

## 2017-06-08 LAB — HEMOGLOBIN A1C: Hgb A1c MFr Bld: 5.8 % (ref 4.6–6.5)

## 2017-06-08 LAB — LDL CHOLESTEROL, DIRECT: LDL DIRECT: 136 mg/dL

## 2017-06-08 NOTE — Patient Instructions (Addendum)
BEFORE YOU LEAVE: -follow up: yearly for Wellness Visit -set up flu shot -labs   [name] , Thank you for taking time to come for your Medicare Wellness Visit. I appreciate your ongoing commitment to your health goals. Please review the following plan we discussed and let me know if I can assist you in the future.   These are the goals we discussed: Goals    Labs today Flu shot in September or October      This is a list of the screening recommended for you and due dates:  Health Maintenance  Topic Date Due  . Flu Shot  05/20/2017  . Mammogram  10/14/2018  . Tetanus Vaccine  10/20/2021  . Colon Cancer Screening  08/04/2024  . DEXA scan (bone density measurement)  Completed  .  Hepatitis C: One time screening is recommended by Center for Disease Control  (CDC) for  adults born from [birth year] through 1966.   Completed  . Pneumonia vaccines  Completed    Health Maintenance for Postmenopausal Women Menopause is a normal process in which your reproductive ability comes to an end. This process happens gradually over a span of months to years, usually between the ages of 21 and 33. Menopause is complete when you have missed 12 consecutive menstrual periods. It is important to talk with your health care provider about some of the most common conditions that affect postmenopausal women, such as heart disease, cancer, and bone loss (osteoporosis). Adopting a healthy lifestyle and getting preventive care can help to promote your health and wellness. Those actions can also lower your chances of developing some of these common conditions. What should I know about menopause? During menopause, you may experience a number of symptoms, such as:  Moderate-to-severe hot flashes.  Night sweats.  Decrease in sex drive.  Mood swings.  Headaches.  Tiredness.  Irritability.  Memory problems.  Insomnia.  Choosing to treat or not to treat menopausal changes is an individual decision that you  make with your health care provider. What should I know about hormone replacement therapy and supplements? Hormone therapy products are effective for treating symptoms that are associated with menopause, such as hot flashes and night sweats. Hormone replacement carries certain risks, especially as you become older. If you are thinking about using estrogen or estrogen with progestin treatments, discuss the benefits and risks with your health care provider. What should I know about heart disease and stroke? Heart disease, heart attack, and stroke become more likely as you age. This may be due, in part, to the hormonal changes that your body experiences during menopause. These can affect how your body processes dietary fats, triglycerides, and cholesterol. Heart attack and stroke are both medical emergencies. There are many things that you can do to help prevent heart disease and stroke:  Have your blood pressure checked at least every 1-2 years. High blood pressure causes heart disease and increases the risk of stroke.  If you are 60-59 years old, ask your health care provider if you should take aspirin to prevent a heart attack or a stroke.  Do not use any tobacco products, including cigarettes, chewing tobacco, or electronic cigarettes. If you need help quitting, ask your health care provider.  It is important to eat a healthy diet and maintain a healthy weight. ? Be sure to include plenty of vegetables, fruits, low-fat dairy products, and lean protein. ? Avoid eating foods that are high in solid fats, added sugars, or salt (sodium).  Get regular exercise. This is one of the most important things that you can do for your health. ? Try to exercise for at least 150 minutes each week. The type of exercise that you do should increase your heart rate and make you sweat. This is known as moderate-intensity exercise. ? Try to do strengthening exercises at least twice each week. Do these in addition to  the moderate-intensity exercise.  Know your numbers.Ask your health care provider to check your cholesterol and your blood glucose. Continue to have your blood tested as directed by your health care provider.  What should I know about cancer screening? There are several types of cancer. Take the following steps to reduce your risk and to catch any cancer development as early as possible. Breast Cancer  Practice breast self-awareness. ? This means understanding how your breasts normally appear and feel. ? It also means doing regular breast self-exams. Let your health care provider know about any changes, no matter how small.  If you are 57 or older, have a clinician do a breast exam (clinical breast exam or CBE) every year. Depending on your age, family history, and medical history, it may be recommended that you also have a yearly breast X-ray (mammogram).  If you have a family history of breast cancer, talk with your health care provider about genetic screening.  If you are at high risk for breast cancer, talk with your health care provider about having an MRI and a mammogram every year.  Breast cancer (BRCA) gene test is recommended for women who have family members with BRCA-related cancers. Results of the assessment will determine the need for genetic counseling and BRCA1 and for BRCA2 testing. BRCA-related cancers include these types: ? Breast. This occurs in males or females. ? Ovarian. ? Tubal. This may also be called fallopian tube cancer. ? Cancer of the abdominal or pelvic lining (peritoneal cancer). ? Prostate. ? Pancreatic.  Cervical, Uterine, and Ovarian Cancer Your health care provider may recommend that you be screened regularly for cancer of the pelvic organs. These include your ovaries, uterus, and vagina. This screening involves a pelvic exam, which includes checking for microscopic changes to the surface of your cervix (Pap test).  For women ages 21-65, health care  providers may recommend a pelvic exam and a Pap test every three years. For women ages 37-65, they may recommend the Pap test and pelvic exam, combined with testing for human papilloma virus (HPV), every five years. Some types of HPV increase your risk of cervical cancer. Testing for HPV may also be done on women of any age who have unclear Pap test results.  Other health care providers may not recommend any screening for nonpregnant women who are considered low risk for pelvic cancer and have no symptoms. Ask your health care provider if a screening pelvic exam is right for you.  If you have had past treatment for cervical cancer or a condition that could lead to cancer, you need Pap tests and screening for cancer for at least 20 years after your treatment. If Pap tests have been discontinued for you, your risk factors (such as having a new sexual partner) need to be reassessed to determine if you should start having screenings again. Some women have medical problems that increase the chance of getting cervical cancer. In these cases, your health care provider may recommend that you have screening and Pap tests more often.  If you have a family history of uterine cancer or ovarian  cancer, talk with your health care provider about genetic screening.  If you have vaginal bleeding after reaching menopause, tell your health care provider.  There are currently no reliable tests available to screen for ovarian cancer.  Lung Cancer Lung cancer screening is recommended for adults 36-34 years old who are at high risk for lung cancer because of a history of smoking. A yearly low-dose CT scan of the lungs is recommended if you:  Currently smoke.  Have a history of at least 30 pack-years of smoking and you currently smoke or have quit within the past 15 years. A pack-year is smoking an average of one pack of cigarettes per day for one year.  Yearly screening should:  Continue until it has been 15 years  since you quit.  Stop if you develop a health problem that would prevent you from having lung cancer treatment.  Colorectal Cancer  This type of cancer can be detected and can often be prevented.  Routine colorectal cancer screening usually begins at age 82 and continues through age 1.  If you have risk factors for colon cancer, your health care provider may recommend that you be screened at an earlier age.  If you have a family history of colorectal cancer, talk with your health care provider about genetic screening.  Your health care provider may also recommend using home test kits to check for hidden blood in your stool.  A small camera at the end of a tube can be used to examine your colon directly (sigmoidoscopy or colonoscopy). This is done to check for the earliest forms of colorectal cancer.  Direct examination of the colon should be repeated every 5-10 years until age 69. However, if early forms of precancerous polyps or small growths are found or if you have a family history or genetic risk for colorectal cancer, you may need to be screened more often.  Skin Cancer  Check your skin from head to toe regularly.  Monitor any moles. Be sure to tell your health care provider: ? About any new moles or changes in moles, especially if there is a change in a mole's shape or color. ? If you have a mole that is larger than the size of a pencil eraser.  If any of your family members has a history of skin cancer, especially at a young age, talk with your health care provider about genetic screening.  Always use sunscreen. Apply sunscreen liberally and repeatedly throughout the day.  Whenever you are outside, protect yourself by wearing long sleeves, pants, a wide-brimmed hat, and sunglasses.  What should I know about osteoporosis? Osteoporosis is a condition in which bone destruction happens more quickly than new bone creation. After menopause, you may be at an increased risk for  osteoporosis. To help prevent osteoporosis or the bone fractures that can happen because of osteoporosis, the following is recommended:  If you are 8-30 years old, get at least 1,000 mg of calcium and at least 600 mg of vitamin D per day.  If you are older than age 60 but younger than age 28, get at least 1,200 mg of calcium and at least 600 mg of vitamin D per day.  If you are older than age 42, get at least 1,200 mg of calcium and at least 800 mg of vitamin D per day.  Smoking and excessive alcohol intake increase the risk of osteoporosis. Eat foods that are rich in calcium and vitamin D, and do weight-bearing exercises several times  each week as directed by your health care provider. What should I know about how menopause affects my mental health? Depression may occur at any age, but it is more common as you become older. Common symptoms of depression include:  Low or sad mood.  Changes in sleep patterns.  Changes in appetite or eating patterns.  Feeling an overall lack of motivation or enjoyment of activities that you previously enjoyed.  Frequent crying spells.  Talk with your health care provider if you think that you are experiencing depression. What should I know about immunizations? It is important that you get and maintain your immunizations. These include:  Tetanus, diphtheria, and pertussis (Tdap) booster vaccine.  Influenza every year before the flu season begins.  Pneumonia vaccine.  Shingles vaccine.  Your health care provider may also recommend other immunizations. This information is not intended to replace advice given to you by your health care provider. Make sure you discuss any questions you have with your health care provider. Document Released: 11/28/2005 Document Revised: 04/25/2016 Document Reviewed: 07/10/2015 Elsevier Interactive Patient Education  2018 [employer].

## 2017-07-03 ENCOUNTER — Encounter: Payer: PPO | Admitting: Family Medicine

## 2017-07-08 DIAGNOSIS — H5203 Hypermetropia, bilateral: Secondary | ICD-10-CM | POA: Diagnosis not present

## 2017-07-08 DIAGNOSIS — H2513 Age-related nuclear cataract, bilateral: Secondary | ICD-10-CM | POA: Diagnosis not present

## 2017-07-09 ENCOUNTER — Encounter: Payer: Self-pay | Admitting: Family Medicine

## 2017-07-31 ENCOUNTER — Ambulatory Visit (INDEPENDENT_AMBULATORY_CARE_PROVIDER_SITE_OTHER): Payer: PPO

## 2017-07-31 DIAGNOSIS — Z23 Encounter for immunization: Secondary | ICD-10-CM

## 2017-10-22 ENCOUNTER — Other Ambulatory Visit: Payer: Self-pay

## 2017-10-22 ENCOUNTER — Ambulatory Visit (INDEPENDENT_AMBULATORY_CARE_PROVIDER_SITE_OTHER): Payer: PPO | Admitting: Certified Nurse Midwife

## 2017-10-22 ENCOUNTER — Encounter: Payer: Self-pay | Admitting: Certified Nurse Midwife

## 2017-10-22 VITALS — BP 118/68 | HR 64 | Resp 16 | Ht 65.0 in | Wt 156.0 lb

## 2017-10-22 DIAGNOSIS — Z780 Asymptomatic menopausal state: Secondary | ICD-10-CM | POA: Diagnosis not present

## 2017-10-22 DIAGNOSIS — B009 Herpesviral infection, unspecified: Secondary | ICD-10-CM | POA: Diagnosis not present

## 2017-10-22 DIAGNOSIS — Z01419 Encounter for gynecological examination (general) (routine) without abnormal findings: Secondary | ICD-10-CM | POA: Diagnosis not present

## 2017-10-22 MED ORDER — VALACYCLOVIR HCL 500 MG PO TABS: ORAL_TABLET | ORAL | 12 refills | Status: DC

## 2017-10-22 NOTE — Patient Instructions (Signed)

## 2017-10-22 NOTE — Progress Notes (Signed)
67 y.o. G9P2002 Married  Caucasian Fe here for annual exam. Menopausal no HRT. Denies vaginal bleeding or vaginal dryness. Using coconut oil as needed and Astroglide for sexual activity with good results. 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 Needs update on Valtrex. Sees Colin Benton for PCP for aex/labs. Patient spent anniversary in [place]. Developed some back soreness while she was there and persists. No injuries. Plans to see Dr. Maudie Mercury for evaluation. No health issues today. Aware she needs her mammogram and will schedule.  Patient's last menstrual period was 10/20/2000.          Sexually active: Yes.    The current method of family planning is vasectomy.    Exercising: Yes.    golf, walking Smoker:  no  Health Maintenance: Pap:  09-28-14 neg, 10-09-16 neg History of Abnormal Pap: no MMG:  10-14-16 category b density birads 1:neg Self Breast exams: yes Colonoscopy:  2015 neg f/u 51yrs BMD:   2018 TDaP:  2013 Shingles: 2012 Pneumonia: 2017 Hep C and HIV: hep c neg 2016 Labs: no   reports that  has never smoked. she has never used smokeless tobacco. She reports that she drinks about 4.8 oz of alcohol per week. She reports that she does not use drugs.  Past Medical History:  Diagnosis Date  . Acute meniscal tear of left knee    treated by Dr Katha Hamming date pending  . Asthma    as a child  . Baker's cyst of knee    left knee-treated by Dr French Ana  . GAD (generalized anxiety disorder) 03/12/2015   -with rare panic disorder related to caring for grandaughter   . GERD (gastroesophageal reflux disease)   . 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 Seasonal allergies   . Spider veins 11/23/2013    Past Surgical History:  Procedure Laterality Date  . BREAST BIOPSY     left  . BREAST LUMPECTOMY  8/99   left breast- benign (lipoma)  . COLONOSCOPY  2005   Dr Lyla Son  . TOE SURGERY      Current Outpatient Medications  Medication Sig Dispense Refill  . acyclovir ointment  (ZOVIRAX) 5 % Apply 1 application topically every 3 (three) hours. 15 g 0  . calcium-vitamin D (OSCAL WITH D) 250-125 MG-UNIT per tablet Take 1 tablet by mouth daily.    . Cyanocobalamin (VITAMIN B 12 PO) Take by mouth as needed.    . Multiple Vitamins-Minerals (MULTIVITAMIN PO) Take by mouth.    . Probiotic Product (PROBIOTIC PO) Take by mouth as needed.    . triamcinolone (NASACORT) 55 MCG/ACT AERO nasal inhaler Place 2 sprays into the nose as needed.    . valACYclovir (VALTREX) 500 MG tablet TAKE 1 TABLET BY MOUTH TWICE A DAY AS NEEDED 30 tablet 3   No current facility-administered medications for this visit.     Family History  Problem Relation Age of Onset  . Heart disease Mother   . Diabetes Mother   . Cancer Mother        hodgekins lymphoma  . Diabetes Sister   . Diabetes Maternal Grandmother   . Colon cancer Neg Hx   . Stomach cancer Neg Hx     ROS:  Pertinent items are noted in HPI.  Otherwise, a comprehensive ROS was negative.  Exam:   BP 118/68   Pulse 64   Resp 16   Ht 5\' 5"  (1.651 m)   Wt 156 lb (70.8 kg)   LMP  10/20/2000   BMI 25.96 kg/m  Height: 5\' 5"  (165.1 cm) Ht Readings from Last 3 Encounters:  10/22/17 5\' 5"  (1.651 m)  06/08/17 5' 5.75" (1.67 m)  10/09/16 5\' 5"  (1.651 m)    General appearance: alert, cooperative and appears stated age Head: Normocephalic, without obvious abnormality, atraumatic Neck: no adenopathy, supple, symmetrical, trachea midline and thyroid normal to inspection and palpation Lungs: clear to auscultation bilaterally Breasts: normal appearance, no masses or tenderness, No nipple retraction or dimpling, No nipple discharge or bleeding, No axillary or supraclavicular adenopathy Heart: regular rate and rhythm Abdomen: soft, non-tender; no masses,  no organomegaly Extremities: extremities normal, atraumatic, no cyanosis or edema Skin: Skin color, texture, turgor normal. No rashes or lesions Lymph nodes: Cervical, supraclavicular,  and axillary nodes normal. No abnormal inguinal nodes palpated Neurologic: Grossly normal   Pelvic: External genitalia:  no lesions              Urethra:  normal appearing urethra with no masses, tenderness or lesions              Bartholin's and Skene's: normal                 Vagina: normal appearing vagina with normal color and discharge, no lesions              Cervix: no cervical motion tenderness, no lesions and normal appearance              Pap taken: No. Bimanual Exam:  Uterus:  normal size, contour, position, consistency, mobility, non-tender              Adnexa: normal adnexa and no mass, fullness, tenderness               Rectovaginal: Confirms               Anus:  normal sphincter tone, no lesions  Chaperone present: yes  A:  Well Woman with normal exam  Menopausal no HRT   Vaginal dryness coconut oil working well  History of HSV needs update of Rx.  Mammogram due  P:   Reviewed health and wellness pertinent to exam  Aware of need to evaluate if vaginal bleeding  Rx Valtrex see order with instructions  Stressed mammogram importance  Pap smear: no   counseled on breast self exam, mammography screening, feminine hygiene, adequate intake of calcium and vitamin D, diet and exercise  return annually or prn  An After Visit Summary was printed and given to the patient.

## 2017-10-29 ENCOUNTER — Encounter: Payer: Self-pay | Admitting: Family Medicine

## 2017-11-04 ENCOUNTER — Other Ambulatory Visit: Payer: Self-pay | Admitting: Certified Nurse Midwife

## 2017-11-04 DIAGNOSIS — Z1231 Encounter for screening mammogram for malignant neoplasm of breast: Secondary | ICD-10-CM

## 2017-11-06 ENCOUNTER — Ambulatory Visit
Admission: RE | Admit: 2017-11-06 | Discharge: 2017-11-06 | Disposition: A | Discharge status: Home / Self Care | Payer: PPO | Source: Ambulatory Visit | Attending: Certified Nurse Midwife | Admitting: Certified Nurse Midwife

## 2017-11-06 DIAGNOSIS — Z1231 Encounter for screening mammogram for malignant neoplasm of breast: Secondary | ICD-10-CM | POA: Diagnosis not present

## 2017-11-09 DIAGNOSIS — D1801 Hemangioma of skin and subcutaneous tissue: Secondary | ICD-10-CM | POA: Diagnosis not present

## 2017-11-09 DIAGNOSIS — L814 Other melanin hyperpigmentation: Secondary | ICD-10-CM | POA: Diagnosis not present

## 2017-11-09 DIAGNOSIS — L570 Actinic keratosis: Secondary | ICD-10-CM | POA: Diagnosis not present

## 2017-11-09 DIAGNOSIS — Z23 Encounter for immunization: Secondary | ICD-10-CM | POA: Diagnosis not present

## 2017-11-09 DIAGNOSIS — D225 Melanocytic nevi of trunk: Secondary | ICD-10-CM | POA: Diagnosis not present

## 2017-11-09 DIAGNOSIS — L821 Other seborrheic keratosis: Secondary | ICD-10-CM | POA: Diagnosis not present

## 2018-02-04 ENCOUNTER — Encounter: Payer: Self-pay | Admitting: Family Medicine

## 2018-02-04 ENCOUNTER — Ambulatory Visit: Payer: PPO | Admitting: Family Medicine

## 2018-02-04 VITALS — BP 99/68 | Temp 98.5°F | Resp 12 | Ht 66.0 in | Wt 155.0 lb

## 2018-02-04 DIAGNOSIS — M79604 Pain in right leg: Secondary | ICD-10-CM

## 2018-02-04 LAB — D-DIMER, QUANTITATIVE (NOT AT ARMC): D DIMER QUANT: 0.5 ug{FEU}/mL — AB (ref <0.50)

## 2018-02-04 MED ORDER — DICLOFENAC SODIUM 75 MG PO TBEC: 75.0000 mg | DELAYED_RELEASE_TABLET | Freq: Two times a day (BID) | ORAL | 0 refills | Status: DC

## 2018-02-04 NOTE — Progress Notes (Signed)
    Subjective:  [name] is a 67 y.o. female who presents today for same-day appointment with a chief complaint of leg [name].   HPI:  Leg [name], Acute Issue Started about a week ago. Worsened yesterday.  Patient was on vacation in [place] for the past week.  As part of her vacation, she was playing tennis regularly.  Patient notes that she wore an Ace bandage over her right knee as well as compression hoses, however is not sure if this contributed to her symptoms at all.  She has tried taking Tylenol and ibuprofen which is not significantly seem to help.  [name] mostly located along the posterior lateral aspect of her right knee as well as the medial aspect of her left ankle.  Patient is very concerned that she may have a blood clot.  No chest [name] or shortness of breath.  No fevers or chills.  No history of blood clots.  [name] is worsened with activity.  ROS: Per HPI  PMH: She reports that she has never smoked. She has never used smokeless tobacco. She reports that she drinks about 4.8 oz of alcohol per week. She reports that she does not use drugs.  Objective:  Physical Exam: BP 99/68 (BP Location: Left Arm)   Temp 98.5 F (36.9 C) (Oral)   Resp 12   Ht 5\' 6"  (1.676 m)   Wt 155 lb (70.3 kg)   LMP 10/20/2000   BMI 25.02 kg/m   Gen: NAD, resting comfortably CV: RRR with no murmurs appreciated Pulm: NWOB, CTAB with no crackles, wheezes, or rhonchi MSK: -Right knee: No deformities.  Mildly tender to palpation along posterior lateral aspect of knee.  No joint line tenderness.  Full range of motion.  Stable to varus and valgus stress.  Anterior posterior drawer signs negative.  No [name] with hamstring curl.   -Right calf: No deformities.  Trace pretibial edema.  Homans sign negative. -Right ankle: No deformities.  Mildly tender to palpation over medial malleolus. No [name] with resisted plantarflexion.  Assessment/Plan:  Right leg [name] Symptoms likely secondary to muscular strain  related to her activity with tennis.  Her well score for DVT is 0.  We will check d-dimer to completely rule this out.  We will treat her strain with Voltaren.  Also discussed use of compression as well as ice as needed.  Return precautions and reasons to seem emergent care as needed.   Algis Greenhouse. Erin Pain, MD 02/04/2018 3:15 PM

## 2018-02-04 NOTE — Patient Instructions (Addendum)
I think you have a muscle strain.  We will check blood work to make sure you don't have a blood clot.  Please start the diclofenac. You can continue using your compression wraps.  Please let us or your primary know if your symptoms worsen or are not improving over the next several days.   Please go to the ED if you have chest pain, shortness of breath, or severe pain.  Take care, Dr Jerline Pain

## 2018-02-08 NOTE — Progress Notes (Signed)
Dr Marigene Ehlers interpretation of your lab work:  Your blood test for a blood clot was negative. You do not have a blood clot. If your symptoms do not improve over the next several days, or if they worsen, please come back to see me or your primary care physician.    If you have any additional questions, please give [place] a call or send [place] a message through [facility].  Take care, Dr Jerline Pain

## 2018-02-25 DIAGNOSIS — M25561 Pain in right knee: Secondary | ICD-10-CM | POA: Diagnosis not present

## 2018-03-19 ENCOUNTER — Telehealth: Payer: Self-pay | Admitting: Family Medicine

## 2018-03-19 NOTE — Telephone Encounter (Signed)
This is an old medication, rarely used these days, and also controlled. I know she used it rarely in the past, but I thought she had stopped using it? I would recommend CBT for rare anxiety rather then this medication for her safety. If she prefers could schedule visit to discuss treatment options for anxiety. Thanks.

## 2018-03-19 NOTE — Telephone Encounter (Signed)
I left a message for the pt to return my call.  CRM also created. 

## 2018-03-19 NOTE — Telephone Encounter (Signed)
Copied from [place] [phone]. Topic: Quick Communication - Rx Refill/Question >> Mar 19, 2018  2:18 PM Ether Griffins B wrote: Medication: clorazepate (TRANXENE) 7.5 MG tablet   Has the patient contacted their pharmacy? Yes.   (Agent: If no, request that the patient contact the pharmacy for the refill.) (Agent: If yes, when and what did the pharmacy advise?)  Preferred Pharmacy (with phone number or street name): [id number] - [place], [address]  Agent: Please be advised that RX refills may take up to 3 business days. We ask that you follow-up with your pharmacy.

## 2018-03-22 NOTE — Telephone Encounter (Signed)
Message given to pt. Pt stated that she is undergoing stress now due to a family member who is in the hospital and wanted something called in for anxiety. Pt refused office visit.

## 2018-03-23 NOTE — Telephone Encounter (Signed)
I am sorry to here about her family member. For her safety and health do advise OV to discuss best options for her symptoms. Thanks.

## 2018-03-23 NOTE — Telephone Encounter (Signed)
I called the pt and informed her of the message below.  She declined scheduling an appt at this time due to her schedule.

## 2018-04-29 ENCOUNTER — Telehealth: Payer: Self-pay | Admitting: *Deleted

## 2018-04-29 NOTE — Telephone Encounter (Signed)
Already approved. Thanks.

## 2018-04-29 NOTE — Telephone Encounter (Signed)
Yes mam already approved as well.

## 2018-04-29 NOTE — Telephone Encounter (Signed)
Dr. Rogers Blocker, okay for transfer? I do not see that this has been previously approved.

## 2018-04-29 NOTE — Telephone Encounter (Signed)
Dr. Maudie Mercury, okay for transfer?

## 2018-04-29 NOTE — Telephone Encounter (Signed)
Copied from [place] [id number]. Topic: Appointment Scheduling - Scheduling Inquiry for Clinic >> Apr 29, 2018 11:19 AM Synthia Innocent wrote: Reason for CRM: Requesting to switch providers from Dr Maudie Mercury to Dr Rogers Blocker, Dr Rogers Blocker office is closer to patient

## 2018-04-30 NOTE — Telephone Encounter (Signed)
Please contact patient to schedule

## 2018-04-30 NOTE — Telephone Encounter (Signed)
I left a voicemail for the patient to transfer care.

## 2018-05-17 ENCOUNTER — Ambulatory Visit (INDEPENDENT_AMBULATORY_CARE_PROVIDER_SITE_OTHER): Payer: PPO | Admitting: Family Medicine

## 2018-05-17 ENCOUNTER — Encounter: Payer: Self-pay | Admitting: Family Medicine

## 2018-05-17 VITALS — BP 138/72 | HR 60 | Temp 98.0°F | Ht 65.5 in | Wt 154.6 lb

## 2018-05-17 DIAGNOSIS — E785 Hyperlipidemia, unspecified: Secondary | ICD-10-CM | POA: Insufficient documentation

## 2018-05-17 DIAGNOSIS — M25561 Pain in right knee: Secondary | ICD-10-CM

## 2018-05-17 DIAGNOSIS — G8929 Other chronic pain: Secondary | ICD-10-CM

## 2018-05-17 NOTE — Progress Notes (Signed)
Patient: [name] MRN: [id number] DOB: 1951/06/15 PCP: Orma Flaming, MD     Subjective:  Chief Complaint  Patient presents with  . Establish Care    right leg pain    HPI: The patient is a 67 y.o. female who presents today for right leg pain. She has was seen by Dr. Jerline Pain in April for knee pain. Pain is getting better. If she does water exercises she has no problem.  She is wanting to transfer care to me from Dr. Maudie Mercury. Due for AWV on or after 06/08/2018.   Review of Systems  Constitutional: Negative for chills, fatigue and fever.  HENT: Negative for dental problem, ear pain, hearing loss and trouble swallowing.   Eyes: Negative for visual disturbance.  Respiratory: Negative for cough, chest tightness and shortness of breath.   Cardiovascular: Negative for chest pain, palpitations and leg swelling.  Gastrointestinal: Negative for abdominal pain, blood in stool, diarrhea and nausea.  Endocrine: Negative for cold intolerance, polydipsia, polyphagia and polyuria.  Genitourinary: Negative for dysuria and hematuria.  Musculoskeletal: Negative for arthralgias.  Skin: Negative for rash.  Neurological: Negative for dizziness and headaches.  Psychiatric/Behavioral: Negative for dysphoric mood and sleep disturbance. The patient is not nervous/anxious.     Allergies Patient is allergic to sulfonamide derivatives and cefdinir.  Past Medical History Patient  has a past medical history of Acute meniscal tear of left knee, Asthma, Baker's cyst of knee, GAD (generalized anxiety disorder) (03/12/2015), GERD (gastroesophageal reflux disease), H/O cold sores (03/12/2015), Seasonal allergies, and Spider veins (11/23/2013).  Surgical History Patient  has a past surgical history that includes Breast biopsy; Breast lumpectomy (8/99); Colonoscopy (2005); and Toe Surgery.  Family History Pateint's family history includes Cancer in her mother; Diabetes in her maternal grandmother, mother, and  sister; Heart disease in her mother.  Social History Patient  reports that she has never smoked. She has never used smokeless tobacco. She reports that she drinks about 4.8 oz of alcohol per week. She reports that she does not use drugs.    Objective: Vitals:   05/17/18 1116  BP: 138/72  Pulse: 60  Temp: 98 F (36.7 C)  TempSrc: Oral  SpO2: 99%  Weight: 154 lb 9.6 oz (70.1 kg)  Height: 5' 5.5" (1.664 m)    Body mass index is 25.34 kg/m.  Physical Exam  Constitutional: She is oriented to person, place, and time. She appears well-developed and well-nourished.  HENT:  Right Ear: External ear normal.  Left Ear: External ear normal.  Mouth/Throat: Oropharynx is clear and moist.  TM pearly with light reflex bilaterally   Eyes: Pupils are equal, round, and reactive to light. Conjunctivae and EOM are normal.  Neck: Normal range of motion. Neck supple. No thyromegaly present.  Cardiovascular: Normal rate, regular rhythm, normal heart sounds and intact distal pulses.  No murmur heard. Pulmonary/Chest: Effort normal and breath sounds normal.  Abdominal: Soft. Bowel sounds are normal. She exhibits no distension. There is no tenderness.  Musculoskeletal: Normal range of motion. She exhibits no edema or tenderness.  Right knee exam wnl.   Lymphadenopathy:    She has no cervical adenopathy.  Neurological: She is alert and oriented to person, place, and time. She displays normal reflexes. No cranial nerve deficit. Coordination normal.  Skin: Skin is warm and dry. No rash noted.  Psychiatric: She has a normal mood and affect. Her behavior is normal.  Vitals reviewed.      Assessment/plan: 1. Chronic pain of right knee  Known OA of her right knee. Doing well with exercises. Recommended she do trial of tumeric as well to see if this helps with pain, but if she exercises she seems to control pain. Also receives steroid shots into her knee.   F/u in 1 month for her AWV/hyperlipidemia labs.       Return in about 2 months (around 07/18/2018) for AWV/labs .     Orma Flaming, MD [place]  05/17/2018

## 2018-05-17 NOTE — Patient Instructions (Signed)
Try tumeric for arthritis in your knee.

## 2018-05-17 NOTE — Progress Notes (Deleted)
  Patient: [name] MRN: [id number] DOB: Feb 19, 1951 PCP: Orma Flaming, MD     Subjective:  Chief Complaint  Patient presents with  . Establish Care    right leg pain    HPI: The patient is a 67 y.o. female who presents today for annual exam. {He/she (caps):30048} denies any changes to past medical history. There have been no recent hospitalizations. They {Actions; are/are not:16769} following a well balanced diet and exercise plan. Weight has been {trend:16658}. No complaints today.   Immunization History  Administered Date(s) Administered  . Influenza Split 08/19/2011, 07/13/2012  . Influenza Whole 07/21/2008  . Influenza, High Dose Seasonal PF 08/21/2015, 07/16/2016, 07/31/2017  . Influenza,inj,Quad PF,6+ Mos 07/12/2013, 07/27/2014  . Pneumococcal Conjugate-13 03/12/2015  . Pneumococcal Polysaccharide-23 07/16/2016  . Td 10/20/1996, 04/14/2008  . Tdap 10/21/2011  . Zoster 10/07/2011   Colonoscopy: Mammogram:  Pap smear:  PSA:   Review of Systems  Constitutional: Positive for fatigue. Negative for activity change.  Respiratory: Negative for shortness of breath.   Cardiovascular: Negative for chest pain.  Musculoskeletal: Negative for arthralgias.  Neurological: Negative for dizziness and headaches.  Psychiatric/Behavioral: The patient is not nervous/anxious.     Allergies Patient is allergic to sulfonamide derivatives and cefdinir.  Past Medical History Patient  has a past medical history of Acute meniscal tear of left knee, Asthma, Baker's cyst of knee, GAD (generalized anxiety disorder) (03/12/2015), GERD (gastroesophageal reflux disease), H/O cold sores (03/12/2015), Seasonal allergies, and Spider veins (11/23/2013).  Surgical History Patient  has a past surgical history that includes Breast biopsy; Breast lumpectomy (8/99); Colonoscopy (2005); and Toe Surgery.  Family History Pateint's family history includes Cancer in her mother; Diabetes in her maternal  grandmother, mother, and sister; Heart disease in her mother.  Social History Patient  reports that she has never smoked. She has never used smokeless tobacco. She reports that she drinks about 4.8 oz of alcohol per week. She reports that she does not use drugs.    Objective: Vitals:   05/17/18 1116  BP: 138/72  Pulse: 60  Temp: 98 F (36.7 C)  TempSrc: Oral  SpO2: 99%  Weight: 154 lb 9.6 oz (70.1 kg)  Height: 5' 5.5" (1.664 m)    Body mass index is 25.34 kg/m.  Physical Exam     Assessment/plan:   No problem-specific Assessment & Plan notes found for this encounter.    No follow-ups on file.     Orma Flaming, MD [place]  05/17/2018

## 2018-06-03 DIAGNOSIS — M1711 Unilateral primary osteoarthritis, right knee: Secondary | ICD-10-CM | POA: Diagnosis not present

## 2018-06-10 DIAGNOSIS — M1711 Unilateral primary osteoarthritis, right knee: Secondary | ICD-10-CM | POA: Diagnosis not present

## 2018-06-17 DIAGNOSIS — M1711 Unilateral primary osteoarthritis, right knee: Secondary | ICD-10-CM | POA: Diagnosis not present

## 2018-07-19 ENCOUNTER — Encounter: Payer: PPO | Admitting: Family Medicine

## 2018-07-21 ENCOUNTER — Encounter: Payer: Self-pay | Admitting: Family Medicine

## 2018-07-21 ENCOUNTER — Ambulatory Visit (INDEPENDENT_AMBULATORY_CARE_PROVIDER_SITE_OTHER): Payer: PPO | Admitting: Family Medicine

## 2018-07-21 ENCOUNTER — Other Ambulatory Visit: Payer: Self-pay

## 2018-07-21 VITALS — BP 120/80 | HR 59 | Temp 98.0°F | Ht 65.5 in | Wt 157.2 lb

## 2018-07-21 DIAGNOSIS — Z0000 Encounter for general adult medical examination without abnormal findings: Secondary | ICD-10-CM

## 2018-07-21 DIAGNOSIS — E782 Mixed hyperlipidemia: Secondary | ICD-10-CM | POA: Diagnosis not present

## 2018-07-21 DIAGNOSIS — B009 Herpesviral infection, unspecified: Secondary | ICD-10-CM | POA: Diagnosis not present

## 2018-07-21 DIAGNOSIS — Z23 Encounter for immunization: Secondary | ICD-10-CM

## 2018-07-21 LAB — CBC WITH DIFFERENTIAL/PLATELET
BASOS PCT: 0.6 % (ref 0.0–3.0)
Basophils Absolute: 0 10*3/uL (ref 0.0–0.1)
Eosinophils Absolute: 0.2 10*3/uL (ref 0.0–0.7)
Eosinophils Relative: 3.1 % (ref 0.0–5.0)
HEMATOCRIT: 41.8 % (ref 36.0–46.0)
HEMOGLOBIN: 14.1 g/dL (ref 12.0–15.0)
LYMPHS PCT: 35.6 % (ref 12.0–46.0)
Lymphs Abs: 2.1 10*3/uL (ref 0.7–4.0)
MCHC: 33.7 g/dL (ref 30.0–36.0)
MCV: 94.4 fl (ref 78.0–100.0)
Monocytes Absolute: 0.8 10*3/uL (ref 0.1–1.0)
Monocytes Relative: 13.7 % — ABNORMAL HIGH (ref 3.0–12.0)
Neutro Abs: 2.7 10*3/uL (ref 1.4–7.7)
Neutrophils Relative %: 47 % (ref 43.0–77.0)
Platelets: 207 10*3/uL (ref 150.0–400.0)
RBC: 4.43 Mil/uL (ref 3.87–5.11)
RDW: 13.3 % (ref 11.5–15.5)
WBC: 5.8 10*3/uL (ref 4.0–10.5)

## 2018-07-21 LAB — COMPREHENSIVE METABOLIC PANEL
ALBUMIN: 4.1 g/dL (ref 3.5–5.2)
ALT: 24 U/L (ref 0–35)
AST: 23 U/L (ref 0–37)
Alkaline Phosphatase: 69 U/L (ref 39–117)
BUN: 17 mg/dL (ref 6–23)
CHLORIDE: 103 meq/L (ref 96–112)
CO2: 27 meq/L (ref 19–32)
Calcium: 9.5 mg/dL (ref 8.4–10.5)
Creatinine, Ser: 0.73 mg/dL (ref 0.40–1.20)
GFR: 84.13 mL/min (ref >60.00)
Glucose, Bld: 95 mg/dL (ref 70–99)
POTASSIUM: 4.2 meq/L (ref 3.5–5.1)
SODIUM: 136 meq/L (ref 135–145)
Total Bilirubin: 0.5 mg/dL (ref 0.2–1.2)
Total Protein: 6.5 g/dL (ref 6.0–8.3)

## 2018-07-21 LAB — LIPID PANEL
CHOL/HDL RATIO: 4
Cholesterol: 219 mg/dL — ABNORMAL HIGH (ref 0–200)
HDL: 59.7 mg/dL (ref >39.00)
NONHDL: 159.03
Triglycerides: 226 mg/dL — ABNORMAL HIGH (ref 0.0–149.0)
VLDL: 45.2 mg/dL — ABNORMAL HIGH (ref 0.0–40.0)

## 2018-07-21 LAB — LDL CHOLESTEROL, DIRECT: Direct LDL: 130 mg/dL

## 2018-07-21 MED ORDER — VALACYCLOVIR HCL 500 MG PO TABS: ORAL_TABLET | ORAL | 12 refills | Status: DC

## 2018-07-21 NOTE — Addendum Note (Signed)
Addended by: Kevan Ny on: 07/21/2018 02:17 PM   Modules accepted: Orders

## 2018-07-21 NOTE — Progress Notes (Signed)
Phone: [phone]  Subjective:  Patient presents today for their Medicare Exam  And hyperlipidemia.   Hyperlipidemia: on no medication. Works on diet and exercise. Avid Training and development officer. Has not been playing as much tennis so feels like she has gained some weight. No hx of HTn, diabetes or heart disease. Does not smoke. +hyperlipidemia in her mother only.   Preventive Screening-Counseling & Management  Vision screen: 20/30 R 20/25 L 20/25 both No exam data present  Advanced directives: yes  Smoking Status:  Never Smoker Second Hand Smoking status:  No smokers in home  Risk Factors Regular exercise: plays golf regularly  Diet: good, lots of home cooked meals  Fall Risk:  None  Fall Risk  07/21/2018 06/08/2017 09/17/2015 09/17/2015 03/12/2015  Falls in the past year? No No No No No   Opioid use history:   no long term opioids use  Cardiac risk factors:  advanced age (older than 53 for men, 29 for women)  Hyperlipidemia: yes  No diabetes. no Family History: mom-hyperlipidemia, diabetes  Depression Screen None. PHQ2 0  Depression screen [facility] 2/9 07/21/2018 06/08/2017 09/17/2015 09/17/2015 03/12/2015  Decreased Interest 0 0 0 0 0  Down, Depressed, Hopeless 0 0 0 0 0  PHQ - 2 Score 0 0 0 0 0    Activities of Daily Living Independent ADLs and IADLs   Hearing Difficulties: -patient declines Cognitive Testing No reported trouble.    Normal 3 word recall  -mini-cog: 5/5  List the Names of Other Physician/Practitioners you currently use:   Immunization History  Administered Date(s) Administered  . Influenza Split 08/19/2011, 07/13/2012  . Influenza Whole 07/21/2008  . Influenza, High Dose Seasonal PF 08/21/2015, 07/16/2016, 07/31/2017  . Influenza,inj,Quad PF,6+ Mos 07/12/2013, 07/27/2014  . Pneumococcal Conjugate-13 03/12/2015  . Pneumococcal Polysaccharide-23 07/16/2016  . Td 10/20/1996, 04/14/2008  . Tdap 10/21/2011  . Zoster 10/07/2011    Required Immunizations needed today: flu   Screening tests- up to date Health Maintenance Due  Topic Date Due  . INFLUENZA VACCINE  05/20/2018    ROS- No pertinent positives discovered in course of AWV Review of Systems  Constitutional: Negative for chills, fever and malaise/fatigue.  HENT: Negative for hearing loss and sore throat.   Eyes: Negative for blurred vision and double vision.  Respiratory: Negative for cough, shortness of breath and wheezing.   Cardiovascular: Negative for chest pain, palpitations and leg swelling.  Gastrointestinal: Negative for abdominal pain, blood in stool, nausea and vomiting.  Genitourinary: Negative for dysuria and hematuria.  Musculoskeletal: Negative for falls.  Skin: Negative for rash.  Neurological: Negative for dizziness and weakness.  Psychiatric/Behavioral: Negative for memory loss and suicidal ideas. The patient is not nervous/anxious and does not have insomnia.      The following were reviewed and entered/updated in epic: Past Medical History:  Diagnosis Date  . Acute meniscal tear of left knee    treated by Dr Katha Hamming date pending  . Asthma    as a child  . Baker's cyst of knee    left knee-treated by Dr French Ana  . GAD (generalized anxiety disorder) 03/12/2015   -with rare panic disorder related to caring for grandaughter   . GERD (gastroesophageal reflux disease)   . 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 Seasonal allergies   . Spider veins 11/23/2013   Patient Active Problem List   Diagnosis Date Noted  . Hyperlipidemia 05/17/2018  . Senile purpura ([place]) 06/08/2017  . Seasonal allergies 09/17/2015  . H/O cold  sores 03/12/2015  . VARICOSE VEIN 01/14/2008  . GERD 07/14/2007   Past Surgical History:  Procedure Laterality Date  . BREAST BIOPSY     left  . BREAST LUMPECTOMY  8/99   left breast- benign (lipoma)  . COLONOSCOPY  2005   Dr Lyla Son  . TOE SURGERY      Family History  Problem Relation Age of Onset  .  Heart disease Mother   . Diabetes Mother   . Cancer Mother        hodgekins lymphoma  . Diabetes Sister   . Diabetes Maternal Grandmother   . Colon cancer Neg Hx   . Stomach cancer Neg Hx     Medications- reviewed and updated Current Outpatient Medications  Medication Sig Dispense Refill  . calcium-vitamin D (OSCAL WITH D) 250-125 MG-UNIT per tablet Take 1 tablet by mouth daily.    . Cyanocobalamin (VITAMIN B 12 PO) Take by mouth as needed.    Marland Kitchen ibuprofen (ADVIL,MOTRIN) 100 MG tablet Take 100 mg by mouth every 6 (six) hours as needed for fever.    . Multiple Vitamins-Minerals (MULTIVITAMIN PO) Take by mouth.    . Probiotic Product (PROBIOTIC PO) Take by mouth as needed.    . triamcinolone (NASACORT) 55 MCG/ACT AERO nasal inhaler Place 2 sprays into the nose as needed.    . valACYclovir (VALTREX) 500 MG tablet TAKE 1 TABLET BY MOUTH TWICE A DAY AS NEEDED 30 tablet 12   No current facility-administered medications for this visit.     Allergies-reviewed and updated Allergies  Allergen Reactions  . Sulfonamide Derivatives Hives    REACTION: hives  . Cefdinir Rash    REACTION: rash    Social History   Socioeconomic History  . Marital status: Married    Spouse name: Not on file  . Number of children: Not on file  . Years of education: Not on file  . Highest education level: Not on file  Occupational History  . Not on file  Social Needs  . Financial resource strain: Not on file  . Food insecurity:    Worry: Not on file    Inability: Not on file  . Transportation needs:    Medical: Not on file    Non-medical: Not on file  Tobacco Use  . Smoking status: Never Smoker  . Smokeless tobacco: Never Used  Substance and Sexual Activity  . Alcohol use: Yes    Alcohol/week: 8.0 standard drinks    Types: 8 Glasses of wine per week  . Drug use: No  . Sexual activity: Yes    Partners: Male    Comment: husband vasectomy  Lifestyle  . Physical activity:    Days per week: Not  on file    Minutes per session: Not on file  . Stress: Not on file  Relationships  . Social connections:    Talks on phone: Not on file    Gets together: Not on file    Attends religious service: Not on file    Active member of club or organization: Not on file    Attends meetings of clubs or organizations: Not on file    Relationship status: Not on file  Other Topics Concern  . 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 Oropharynx normal Neck: no thyromegaly CV: RRR no murmurs rubs or gallops Lungs: CTAB no crackles, wheeze, rhonchi Abdomen: soft/nontender/nondistended/normal bowel sounds. No rebound or guarding.  Ext: no edema Skin: warm, dry Neuro: grossly normal, moves all extremities, PERRLA  Assessment/Plan:  Annual Medicare exam completed- discussed recommended screenings anddocumented any personalized health advice and referrals for preventive counseling. See AVS as well which was given to patient.   Status of chronic or acute concerns  Hyperlipidemia: routine lab work. Continue conservative therapy. Will calculate her ASCVD risk.   Flu shot today   Future Appointments  Date Time Provider [place]  11/02/2018  2:45 PM Regina Eck, CNM [place] None   Return in about 1 year (around 07/22/2019).   Lab/Order associations: Medicare annual wellness visit, subsequent  Mixed hyperlipidemia  No orders of the defined types were  placed in this encounter.   Return precautions advised. Orma Flaming, MD

## 2018-09-01 DIAGNOSIS — M278 Other specified diseases of jaws: Secondary | ICD-10-CM | POA: Diagnosis not present

## 2018-11-02 ENCOUNTER — Encounter: Payer: Self-pay | Admitting: Certified Nurse Midwife

## 2018-11-02 ENCOUNTER — Ambulatory Visit (INDEPENDENT_AMBULATORY_CARE_PROVIDER_SITE_OTHER): Payer: PPO | Admitting: Certified Nurse Midwife

## 2018-11-02 VITALS — BP 120/80 | HR 70 | Resp 16 | Ht 65.0 in | Wt 159.0 lb

## 2018-11-02 DIAGNOSIS — M8580 Other specified disorders of bone density and structure, unspecified site: Secondary | ICD-10-CM | POA: Diagnosis not present

## 2018-11-02 DIAGNOSIS — E2839 Other primary ovarian failure: Secondary | ICD-10-CM

## 2018-11-02 DIAGNOSIS — Z780 Asymptomatic menopausal state: Secondary | ICD-10-CM | POA: Diagnosis not present

## 2018-11-02 DIAGNOSIS — Z01419 Encounter for gynecological examination (general) (routine) without abnormal findings: Secondary | ICD-10-CM

## 2018-11-02 DIAGNOSIS — Z124 Encounter for screening for malignant neoplasm of cervix: Secondary | ICD-10-CM | POA: Diagnosis not present

## 2018-11-02 NOTE — Progress Notes (Signed)
[name] here for annual exam. Menopausal no vaginal dryness, has noted decrease libido, feel it was busy holiday season. [name] PCP for aex, labs. 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 Needs update of Rx. Staying active with tennis and golf. No health issues in the past year.  Patient's last menstrual period was 10/20/2000.          Sexually active: Yes.    The current method of family planning is vasectomy.    Exercising: Yes.    Golf and tennis Smoker:  no  Review of Systems  Constitutional: Negative.   HENT: Negative.   Eyes: Negative.   Cardiovascular: Negative.   Gastrointestinal: Negative.   Genitourinary: Negative.   Musculoskeletal: Negative.   Skin: Negative.   Neurological: Negative.   Endo/Heme/Allergies: Negative.   Psychiatric/Behavioral: Negative.     Health Maintenance: Pap:  10-09-16 neg History of Abnormal Pap: no MMG:  11-06-17 category b density birads 1:neg Self Breast exams: yes Colonoscopy:  2015 neg f/u 23yrs BMD:   2018 TDaP:  2013 Shingles: 2012 Pneumonia: 2017 Hep C and HIV: hep c neg 2016 Labs: PCP   reports that she has never smoked. She has never used smokeless tobacco. She reports current alcohol use of about 8.0 standard drinks of alcohol per week. She reports that she does not use drugs.  Past Medical History:  Diagnosis Date  . Acute meniscal tear of left knee    treated by Dr Katha Hamming date pending  . Asthma    as a child  . Baker's cyst of knee    left knee-treated by Dr French Ana  . GAD (generalized anxiety disorder) 03/12/2015   -with rare panic disorder related to caring for grandaughter   . GERD (gastroesophageal reflux disease)   . 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 Seasonal allergies   . Spider veins 11/23/2013    Past Surgical History:  Procedure Laterality Date  . BREAST BIOPSY     left  . BREAST LUMPECTOMY  8/99   left breast- benign (lipoma)  . COLONOSCOPY   2005   Dr Lyla Son  . TOE SURGERY      Current Outpatient Medications  Medication Sig Dispense Refill  . calcium-vitamin D (OSCAL WITH D) 250-125 MG-UNIT per tablet Take 1 tablet by mouth daily.    . Cyanocobalamin (VITAMIN B 12 PO) Take by mouth as needed.    Marland Kitchen ibuprofen (ADVIL,MOTRIN) 100 MG tablet Take 100 mg by mouth every 6 (six) hours as needed for fever.    . Multiple Vitamins-Minerals (MULTIVITAMIN PO) Take by mouth.    . Probiotic Product (PROBIOTIC PO) Take by mouth as needed.    . triamcinolone (NASACORT) 55 MCG/ACT AERO nasal inhaler Place 2 sprays into the nose as needed.    . valACYclovir (VALTREX) 500 MG tablet TAKE 1 TABLET BY MOUTH TWICE A DAY AS NEEDED 30 tablet 12   No current facility-administered medications for this visit.     Family History  Problem Relation Age of Onset  . Heart disease Mother   . Diabetes Mother   . Cancer Mother        hodgekins lymphoma  . Diabetes Sister   . Diabetes Maternal Grandmother   . Colon cancer Neg Hx   . Stomach cancer Neg Hx     ROS:  Pertinent items are noted in HPI.  Otherwise, a comprehensive ROS was negative.  Exam:   LMP  10/20/2000    Ht Readings from Last 3 Encounters:  07/21/18 5' 5.5" (1.664 m)  05/17/18 5' 5.5" (1.664 m)  02/04/18 5\' 6"  (1.676 m)    General appearance: alert, cooperative and appears stated age Head: Normocephalic, without obvious abnormality, atraumatic Neck: no adenopathy, supple, symmetrical, trachea midline and thyroid normal to inspection and palpation Lungs: clear to auscultation bilaterally Breasts: normal appearance, no masses or tenderness, No nipple retraction or dimpling, No nipple discharge or bleeding, No axillary or supraclavicular adenopathy Heart: regular rate and rhythm Abdomen: soft, non-tender; no masses,  no organomegaly Extremities: extremities normal, atraumatic, no cyanosis or edema Skin: Skin color, texture, turgor normal. No rashes or lesions Lymph nodes:  Cervical, supraclavicular, and axillary nodes normal. No abnormal inguinal nodes palpated Neurologic: Grossly normal   Pelvic: External genitalia:  no lesions, atrophic appearance              Urethra:  normal appearing urethra with no masses, tenderness or lesions              Bartholin's and Skene's: normal                 Vagina: normal appearing vagina with normal color and discharge, no lesions              Cervix: multiparous appearance, no cervical motion tenderness and no lesions              Pap taken: Yes.   Bimanual Exam:  Uterus:  normal size, contour, position, consistency, mobility, non-tender and anteverted              Adnexa: normal adnexa and no mass, fullness, tenderness               Rectovaginal: Confirms               Anus:  normal sphincter tone, no lesions  Chaperone present: yes  A:  Well Woman with normal exam  Post menopausal   History of HSV 1 PCP management  Hyperlipidemia with PCP management  BMD due, history of Osteopenia  P:   Reviewed health and wellness pertinent to exam  Aware of need to advise if vaginal bleeding or dryness issues  Continue follow up with PCP as indicated  Discussed BMD due and will schedule with mammogram. Order placed for BMD, patient will call to schedule  Pap smear: yes   counseled on breast self exam, mammography screening, feminine hygiene, adequate intake of calcium and vitamin D, diet and exercise  return annually or prn  An After Visit Summary was printed and given to the patient.

## 2018-11-02 NOTE — Patient Instructions (Signed)

## 2018-11-03 ENCOUNTER — Other Ambulatory Visit (HOSPITAL_COMMUNITY)
Admission: RE | Admit: 2018-11-03 | Discharge: 2018-11-03 | Disposition: A | Discharge status: Home / Self Care | Payer: PPO | Source: Ambulatory Visit | Attending: Certified Nurse Midwife | Admitting: Certified Nurse Midwife

## 2018-11-03 DIAGNOSIS — Z124 Encounter for screening for malignant neoplasm of cervix: Secondary | ICD-10-CM | POA: Diagnosis not present

## 2018-11-03 NOTE — Addendum Note (Signed)
Addended by: Regina Eck on: 11/03/2018 01:31 PM   Modules accepted: Orders

## 2018-11-04 ENCOUNTER — Other Ambulatory Visit: Payer: Self-pay | Admitting: Certified Nurse Midwife

## 2018-11-04 DIAGNOSIS — Z1231 Encounter for screening mammogram for malignant neoplasm of breast: Secondary | ICD-10-CM

## 2018-11-05 LAB — CYTOLOGY - PAP
Diagnosis: NEGATIVE
HPV (WINDOPATH): NOT DETECTED

## 2018-11-12 DIAGNOSIS — D225 Melanocytic nevi of trunk: Secondary | ICD-10-CM | POA: Diagnosis not present

## 2018-11-12 DIAGNOSIS — Z23 Encounter for immunization: Secondary | ICD-10-CM | POA: Diagnosis not present

## 2018-11-12 DIAGNOSIS — L814 Other melanin hyperpigmentation: Secondary | ICD-10-CM | POA: Diagnosis not present

## 2018-11-12 DIAGNOSIS — L821 Other seborrheic keratosis: Secondary | ICD-10-CM | POA: Diagnosis not present

## 2018-12-08 ENCOUNTER — Ambulatory Visit (INDEPENDENT_AMBULATORY_CARE_PROVIDER_SITE_OTHER): Payer: PPO | Admitting: Family Medicine

## 2018-12-08 ENCOUNTER — Encounter: Payer: Self-pay | Admitting: Family Medicine

## 2018-12-08 VITALS — BP 148/80 | HR 67 | Temp 98.7°F | Ht 65.0 in | Wt 160.2 lb

## 2018-12-08 DIAGNOSIS — J0100 Acute maxillary sinusitis, unspecified: Secondary | ICD-10-CM | POA: Diagnosis not present

## 2018-12-08 DIAGNOSIS — R52 Pain, unspecified: Secondary | ICD-10-CM | POA: Diagnosis not present

## 2018-12-08 LAB — POCT INFLUENZA A/B
Influenza A, POC: NEGATIVE
Influenza B, POC: NEGATIVE

## 2018-12-08 MED ORDER — BETAMETHASONE SOD PHOS & ACET 6 (3-3) MG/ML IJ SUSP
6.0000 mg | Freq: Once | INTRAMUSCULAR | Status: AC
  Administered 2018-12-08: 6 mg via INTRAMUSCULAR

## 2018-12-08 MED ORDER — AMOXICILLIN-POT CLAVULANATE 875-125 MG PO TABS: 1.0000 | ORAL_TABLET | Freq: Two times a day (BID) | ORAL | 0 refills | Status: DC

## 2018-12-08 NOTE — Progress Notes (Signed)
Patient: [name] MRN: [id number] DOB: 1951-05-08 PCP: Orma Flaming, MD     Subjective:  Chief Complaint  Patient presents with  . Cough    HPI: The patient is a 68 y.o. female who presents today for cough and flu exposure. Was around her daughter who tested positive for both A&B. Also on plane traveling. She was around her daughter a couple of weeks ago, but didn't start feeling bad until a few days ago. She was on 4 different airplanes. She started feeling bad yesterday. She played 9 holes of golf Monday. Monday night her head started to pound and her face hurt. She has sinus pain/pressure and congestion. She also has a cough which started on Monday. It is dry in nature. Her mucous is green and she is also coughing up green stuff. Sudafed is helping. She is having chills and feels like she is burning up. She has had her flu shot.   Review of Systems  Constitutional: Positive for chills. Negative for fatigue and fever.  HENT: Positive for congestion, ear pain, postnasal drip, rhinorrhea, sinus pressure, sinus pain and sore throat.   Eyes: Negative for pain.  Respiratory: Positive for cough. Negative for shortness of breath.   Cardiovascular: Negative for chest pain.  Gastrointestinal: Negative for abdominal pain and nausea.  Musculoskeletal: Positive for arthralgias and myalgias. Negative for back pain and neck pain.  Neurological: Positive for headaches. Negative for dizziness.  Psychiatric/Behavioral: Negative for sleep disturbance.    Allergies Patient is allergic to sulfonamide derivatives and cefdinir.  Past Medical History Patient  has a past medical history of Acute meniscal tear of left knee, Asthma, Baker's cyst of knee, GAD (generalized anxiety disorder) (03/12/2015), GERD (gastroesophageal reflux disease), H/O cold sores (03/12/2015), Seasonal allergies, and Spider veins (11/23/2013).  Surgical History Patient  has a past surgical history that includes Breast biopsy;  Breast lumpectomy (8/99); Colonoscopy (2005); and Toe Surgery.  Family History Pateint's family history includes Cancer in her mother; Diabetes in her maternal grandmother, mother, and sister; Heart disease in her mother.  Social History Patient  reports that she has never smoked. She has never used smokeless tobacco. She reports current alcohol use of about 8.0 standard drinks of alcohol per week. She reports that she does not use drugs.    Objective: Vitals:   12/08/18 1048  BP: (!) 148/80  Pulse: 67  Temp: 98.7 F (37.1 C)  TempSrc: Oral  SpO2: 97%  Weight: 160 lb 3.2 oz (72.7 kg)  Height: 5\' 5"  (1.651 m)    Body mass index is 26.66 kg/m.  Physical Exam Vitals signs reviewed.  Constitutional:      Appearance: Normal appearance.  HENT:     Right Ear: Tympanic membrane, ear canal and external ear normal.     Left Ear: Tympanic membrane, ear canal and external ear normal.     Nose: Congestion present.     Comments: TTP over sinuses. Nasal turbinates with severe edema Eyes:     Extraocular Movements: Extraocular movements intact.     Pupils: Pupils are equal, round, and reactive to light.  Neck:     Musculoskeletal: Normal range of motion and neck supple.  Cardiovascular:     Rate and Rhythm: Normal rate and regular rhythm.     Heart sounds: Normal heart sounds.  Pulmonary:     Effort: Pulmonary effort is normal. No respiratory distress.     Breath sounds: Normal breath sounds. No wheezing or rales.  Abdominal:  General: Abdomen is flat. Bowel sounds are normal.     Palpations: Abdomen is soft.  Lymphadenopathy:     Cervical: No cervical adenopathy.  Skin:    General: Skin is warm and dry.  Neurological:     General: No focal deficit present.     Mental Status: She is alert and oriented to person, place, and time.        Flu: negative   Assessment/plan: 1. Body aches Flu negative. Ibuprofen prn. See below.  - POCT Influenza A/B - betamethasone  acetate-betamethasone sodium phosphate (CELESTONE) injection 6 mg  2. Acute non-recurrent maxillary sinusitis Steroid shot and course of augmentin. Recommended taking with probiotics. Side effects discussed. Also want her to stop sudafed as it's increasing her blood pressure. Did lower dose of steroids today. flonase nightly and cool mist humidifier prn. Let me know if not getting better.  - betamethasone acetate-betamethasone sodium phosphate (CELESTONE) injection 6 mg     Return if symptoms worsen or fail to improve.   Orma Flaming, MD [place]   12/08/2018

## 2018-12-08 NOTE — Patient Instructions (Signed)
Steroid shot in office today. Will take about 24 hours, but helps inflammation and pressure augmentin twice a day for 10 days Let me know if not getting better.   Sinusitis, Adult Sinusitis is soreness and swelling (inflammation) of your sinuses. Sinuses are hollow spaces in the bones around your face. They are located:  Around your eyes.  In the middle of your forehead.  Behind your nose.  In your cheekbones. Your sinuses and nasal passages are lined with a fluid called mucus. Mucus drains out of your sinuses. Swelling can trap mucus in your sinuses. This lets germs (bacteria, virus, or fungus) grow, which leads to infection. Most of the time, this condition is caused by a virus. What are the causes? This condition is caused by:  Allergies.  Asthma.  Germs.  Things that block your nose or sinuses.  Growths in the nose (nasal polyps).  Chemicals or irritants in the air.  Fungus (rare). What increases the risk? You are more likely to develop this condition if:  You have a weak body defense system (immune system).  You do a lot of swimming or diving.  You use nasal sprays too much.  You smoke. What are the signs or symptoms? The main symptoms of this condition are pain and a feeling of pressure around the sinuses. Other symptoms include:  Stuffy nose (congestion).  Runny nose (drainage).  Swelling and warmth in the sinuses.  Headache.  Toothache.  A cough that may get worse at night.  Mucus that collects in the throat or the back of the nose (postnasal drip).  Being unable to smell and taste.  Being very tired (fatigue).  A fever.  Sore throat.  Bad breath. How is this diagnosed? This condition is diagnosed based on:  Your symptoms.  Your medical history.  A physical exam.  Tests to find out if your condition is short-term (acute) or long-term (chronic). Your doctor may: ? Check your nose for growths (polyps). ? Check your sinuses using a  tool that has a light (endoscope). ? Check for allergies or germs. ? Do imaging tests, such as an MRI or CT scan. How is this treated? Treatment for this condition depends on the cause and whether it is short-term or long-term.  If caused by a virus, your symptoms should go away on their own within 10 days. You may be given medicines to relieve symptoms. They include: ? Medicines that shrink swollen tissue in the nose. ? Medicines that treat allergies (antihistamines). ? A spray that treats swelling of the nostrils. ? Rinses that help get rid of thick mucus in your nose (nasal saline washes).  If caused by bacteria, your doctor may wait to see if you will get better without treatment. You may be given antibiotic medicine if you have: ? A very bad infection. ? A weak body defense system.  If caused by growths in the nose, you may need to have surgery. Follow these instructions at home: Medicines  Take, use, or apply over-the-counter and prescription medicines only as told by your doctor. These may include nasal sprays.  If you were prescribed an antibiotic medicine, take it as told by your doctor. Do not stop taking the antibiotic even if you start to feel better. Hydrate and humidify   Drink enough water to keep your pee (urine) pale yellow.  Use a cool mist humidifier to keep the humidity level in your home above 50%.  Breathe in steam for 10-15 minutes, 3-4 times a  day, or as told by your doctor. You can do this in the bathroom while a hot shower is running.  Try not to spend time in cool or dry air. Rest  Rest as much as you can.  Sleep with your head raised (elevated).  Make sure you get enough sleep each night. General instructions   Put a warm, moist washcloth on your face 3-4 times a day, or as often as told by your doctor. This will help with discomfort.  Wash your hands often with soap and water. If there is no soap and water, use hand sanitizer.  Do not  smoke. Avoid being around people who are smoking (secondhand smoke).  Keep all follow-up visits as told by your doctor. This is important. Contact a doctor if:  You have a fever.  Your symptoms get worse.  Your symptoms do not get better within 10 days. Get help right away if:  You have a very bad headache.  You cannot stop throwing up (vomiting).  You have very bad pain or swelling around your face or eyes.  You have trouble seeing.  You feel confused.  Your neck is stiff.  You have trouble breathing. Summary  Sinusitis is swelling of your sinuses. Sinuses are hollow spaces in the bones around your face.  This condition is caused by tissues in your nose that become inflamed or swollen. This traps germs. These can lead to infection.  If you were prescribed an antibiotic medicine, take it as told by your doctor. Do not stop taking it even if you start to feel better.  Keep all follow-up visits as told by your doctor. This is important. This information is not intended to replace advice given to you by your health care provider. Make sure you discuss any questions you have with your health care provider. Document Released: 03/24/2008 Document Revised: 03/08/2018 Document Reviewed: 03/08/2018 Elsevier Interactive Patient Education  2019 [employer].

## 2018-12-24 ENCOUNTER — Other Ambulatory Visit: Payer: Self-pay | Admitting: Certified Nurse Midwife

## 2018-12-24 DIAGNOSIS — M8580 Other specified disorders of bone density and structure, unspecified site: Secondary | ICD-10-CM

## 2018-12-24 DIAGNOSIS — E2839 Other primary ovarian failure: Secondary | ICD-10-CM

## 2018-12-29 ENCOUNTER — Ambulatory Visit: Payer: PPO

## 2018-12-29 ENCOUNTER — Other Ambulatory Visit: Payer: PPO

## 2019-02-21 ENCOUNTER — Other Ambulatory Visit: Payer: PPO

## 2019-02-21 ENCOUNTER — Ambulatory Visit: Payer: PPO

## 2019-04-08 ENCOUNTER — Other Ambulatory Visit: Payer: Self-pay

## 2019-04-08 ENCOUNTER — Ambulatory Visit
Admission: RE | Admit: 2019-04-08 | Discharge: 2019-04-08 | Disposition: A | Discharge status: Home / Self Care | Payer: PPO | Source: Ambulatory Visit | Attending: Certified Nurse Midwife | Admitting: Certified Nurse Midwife

## 2019-04-08 DIAGNOSIS — M8580 Other specified disorders of bone density and structure, unspecified site: Secondary | ICD-10-CM

## 2019-04-08 DIAGNOSIS — M85852 Other specified disorders of bone density and structure, left thigh: Secondary | ICD-10-CM | POA: Diagnosis not present

## 2019-04-08 DIAGNOSIS — Z1231 Encounter for screening mammogram for malignant neoplasm of breast: Secondary | ICD-10-CM | POA: Diagnosis not present

## 2019-04-08 DIAGNOSIS — E2839 Other primary ovarian failure: Secondary | ICD-10-CM

## 2019-04-11 ENCOUNTER — Ambulatory Visit: Payer: PPO

## 2019-04-11 ENCOUNTER — Other Ambulatory Visit: Payer: PPO

## 2019-07-25 ENCOUNTER — Encounter: Payer: Self-pay | Admitting: Family Medicine

## 2019-07-25 ENCOUNTER — Other Ambulatory Visit: Payer: Self-pay

## 2019-07-25 ENCOUNTER — Ambulatory Visit (INDEPENDENT_AMBULATORY_CARE_PROVIDER_SITE_OTHER): Payer: PPO

## 2019-07-25 VITALS — BP 136/72 | Temp 97.6°F | Ht 65.0 in | Wt 157.6 lb

## 2019-07-25 DIAGNOSIS — Z0000 Encounter for general adult medical examination without abnormal findings: Secondary | ICD-10-CM

## 2019-07-25 DIAGNOSIS — Z23 Encounter for immunization: Secondary | ICD-10-CM | POA: Diagnosis not present

## 2019-07-25 NOTE — Progress Notes (Signed)
I have reviewed the documentation from the recent AWV done by Courtney Slade; I agree with the documentation and will follow up on any recommendations or abnormal findings as suggested.  , MD [facility]    

## 2019-07-25 NOTE — Patient Instructions (Signed)
[name] , Thank you for taking time to come for your Medicare Wellness Visit. I appreciate your ongoing commitment to your health goals. Please review the following plan we discussed and let me know if I can assist you in the future.   Screening recommendations/referrals: Colorectal Screening: completed 08/04/14 with Dr. Olevia Perches Mammogram: completed 04/08/19 Bone Density: completed 04/08/19  Vision and Dental Exams: Recommended annual ophthalmology exams for early detection of glaucoma and other disorders of the eye Recommended annual dental exams for proper oral hygiene  Vaccinations: Influenza vaccine: today Pneumococcal vaccine: up to date; last 07/16/16 Tdap vaccine: up to date; last 2013  Shingles vaccine: Please call your insurance company to determine your out of pocket expense for the Shingrix vaccine. You may receive this vaccine at your local pharmacy.  Advanced directives: Please bring a copy of your POA (Power of Attorney) and/or Living Will to your next appointment.  Goals: Recommend to drink at least 6-8 8oz glasses of water per day and follow a low fat diet incorporating fresh fruits and vegetables.  Next appointment: Please schedule your Annual Wellness Visit with your Nurse Health Advisor in one year. Schedule a Physical appointment with Dr. Rogers Blocker before the end of the year.   Preventive Care 67 Years and Older, Female Preventive care refers to lifestyle choices and visits with your health care provider that can promote health and wellness. What does preventive care include?  A yearly physical exam. This is also called an annual well check.  Dental exams once or twice a year.  Routine eye exams. Ask your health care provider how often you should have your eyes checked.  Personal lifestyle choices, including:  Daily care of your teeth and gums.  Regular physical activity.  Eating a healthy diet.  Avoiding tobacco and drug use.  Limiting alcohol use.   Practicing safe sex.  Taking low-dose aspirin every day if recommended by your health care provider.  Taking vitamin and mineral supplements as recommended by your health care provider. What happens during an annual well check? The services and screenings done by your health care provider during your annual well check will depend on your age, overall health, lifestyle risk factors, and family history of disease. Counseling  Your health care provider may ask you questions about your:  Alcohol use.  Tobacco use.  Drug use.  Emotional well-being.  Home and relationship well-being.  Sexual activity.  Eating habits.  History of falls.  Memory and ability to understand (cognition).  Work and work Statistician.  Reproductive health. Screening  You may have the following tests or measurements:  Height, weight, and BMI.  Blood pressure.  Lipid and cholesterol levels. These may be checked every 5 years, or more frequently if you are over 34 years old.  Skin check.  Lung cancer screening. You may have this screening every year starting at age 61 if you have a 30-pack-year history of smoking and currently smoke or have quit within the past 15 years.  Fecal occult blood test (FOBT) of the stool. You may have this test every year starting at age 67.  Flexible sigmoidoscopy or colonoscopy. You may have a sigmoidoscopy every 5 years or a colonoscopy every 10 years starting at age 84.  Hepatitis C blood test.  Hepatitis B blood test.  Sexually transmitted disease (STD) testing.  Diabetes screening. This is done by checking your blood sugar (glucose) after you have not eaten for a while (fasting). You may have this done every 1-3  years.  Bone density scan. This is done to screen for osteoporosis. You may have this done starting at age 4.  Mammogram. This may be done every 1-2 years. Talk to your health care provider about how often you should have regular mammograms. Talk  with your health care provider about your test results, treatment options, and if necessary, the need for more tests. Vaccines  Your health care provider may recommend certain vaccines, such as:  Influenza vaccine. This is recommended every year.  Tetanus, diphtheria, and acellular pertussis (Tdap, Td) vaccine. You may need a Td booster every 10 years.  Zoster vaccine. You may need this after age 52.  Pneumococcal 13-valent conjugate (PCV13) vaccine. One dose is recommended after age 87.  Pneumococcal polysaccharide (PPSV23) vaccine. One dose is recommended after age 21. Talk to your health care provider about which screenings and vaccines you need and how often you need them. This information is not intended to replace advice given to you by your health care provider. Make sure you discuss any questions you have with your health care provider. Document Released: 11/02/2015 Document Revised: 06/25/2016 Document Reviewed: 08/07/2015 Elsevier Interactive Patient Education  2017 [employer] Prevention in the Home Falls can cause injuries. They can happen to people of all ages. There are many things you can do to make your home safe and to help prevent falls. What can I do on the outside of my home?  Regularly fix the edges of walkways and driveways and fix any cracks.  Remove anything that might make you trip as you walk through a door, such as a raised step or threshold.  Trim any bushes or trees on the path to your home.  Use bright outdoor lighting.  Clear any walking paths of anything that might make someone trip, such as rocks or tools.  Regularly check to see if handrails are loose or broken. Make sure that both sides of any steps have handrails.  Any raised decks and porches should have guardrails on the edges.  Have any leaves, snow, or ice cleared regularly.  Use sand or salt on walking paths during winter.  Clean up any spills in your garage right away. This  includes oil or grease spills. What can I do in the bathroom?  Use night lights.  Install grab bars by the toilet and in the tub and shower. Do not use towel bars as grab bars.  Use non-skid mats or decals in the tub or shower.  If you need to sit down in the shower, use a plastic, non-slip stool.  Keep the floor dry. Clean up any water that spills on the floor as soon as it happens.  Remove soap buildup in the tub or shower regularly.  Attach bath mats securely with double-sided non-slip rug tape.  Do not have throw rugs and other things on the floor that can make you trip. What can I do in the bedroom?  Use night lights.  Make sure that you have a light by your bed that is easy to reach.  Do not use any sheets or blankets that are too big for your bed. They should not hang down onto the floor.  Have a firm chair that has side arms. You can use this for support while you get dressed.  Do not have throw rugs and other things on the floor that can make you trip. What can I do in the kitchen?  Clean up any spills right away.  Avoid walking on wet floors.  Keep items that you use a lot in easy-to-reach places.  If you need to reach something above you, use a strong step stool that has a grab bar.  Keep electrical cords out of the way.  Do not use floor polish or wax that makes floors slippery. If you must use wax, use non-skid floor wax.  Do not have throw rugs and other things on the floor that can make you trip. What can I do with my stairs?  Do not leave any items on the stairs.  Make sure that there are handrails on both sides of the stairs and use them. Fix handrails that are broken or loose. Make sure that handrails are as long as the stairways.  Check any carpeting to make sure that it is firmly attached to the stairs. Fix any carpet that is loose or worn.  Avoid having throw rugs at the top or bottom of the stairs. If you do have throw rugs, attach them to the  floor with carpet tape.  Make sure that you have a light switch at the top of the stairs and the bottom of the stairs. If you do not have them, ask someone to add them for you. What else can I do to help prevent falls?  Wear shoes that:  Do not have high heels.  Have rubber bottoms.  Are comfortable and fit you well.  Are closed at the toe. Do not wear sandals.  If you use a stepladder:  Make sure that it is fully opened. Do not climb a closed stepladder.  Make sure that both sides of the stepladder are locked into place.  Ask someone to hold it for you, if possible.  Clearly mark and make sure that you can see:  Any grab bars or handrails.  First and last steps.  Where the edge of each step is.  Use tools that help you move around (mobility aids) if they are needed. These include:  Canes.  Walkers.  Scooters.  Crutches.  Turn on the lights when you go into a dark area. Replace any light bulbs as soon as they burn out.  Set up your furniture so you have a clear path. Avoid moving your furniture around.  If any of your floors are uneven, fix them.  If there are any pets around you, be aware of where they are.  Review your medicines with your doctor. Some medicines can make you feel dizzy. This can increase your chance of falling. Ask your doctor what other things that you can do to help prevent falls. This information is not intended to replace advice given to you by your health care provider. Make sure you discuss any questions you have with your health care provider. Document Released: 08/02/2009 Document Revised: 03/13/2016 Document Reviewed: 11/10/2014 Elsevier Interactive Patient Education  2017 [employer].

## 2019-07-25 NOTE — Progress Notes (Signed)
Subjective:   [name] is a 68 y.o. female who presents for Medicare Annual (Subsequent) preventive examination.  Review of Systems:   Cardiac Risk Factors include: advanced age (>6men, >65 women);dyslipidemia     Objective:     Vitals: BP 136/72 (BP Location: Left Arm, Patient Position: Sitting, Cuff Size: Normal)   Temp 97.6 F (36.4 C) (Temporal)   Ht 5\' 5"  (1.651 m)   Wt 157 lb 9.6 oz (71.5 kg)   LMP 10/20/2000   BMI 26.23 kg/m   Body mass index is 26.23 kg/m.  Advanced Directives 07/25/2019 09/17/2015 08/04/2014 07/14/2014  Does Patient Have a Medical Advance Directive? Yes Yes No Yes  Type of Advance Directive Living will;Healthcare Power of [place]  Does patient want to make changes to medical advance directive? No - Patient declined - - -  Copy of [place] in Chart? No - copy requested No - copy requested - -    Tobacco Social History   Tobacco Use  Smoking Status Never Smoker  Smokeless Tobacco Never Used     Counseling given: Not Answered   Clinical Intake:  Pre-visit preparation completed: Yes  Pain : No/denies pain  Diabetes: No  How often do you need to have someone help you when you read instructions, pamphlets, or other written materials from your doctor or pharmacy?: 1 - Never  Interpreter Needed?: No  Information entered by :: [name]  Past Medical History:  Diagnosis Date  . Acute meniscal tear of left knee    treated by Dr Katha Hamming date pending  . Asthma    as a child  . Baker's cyst of knee    left knee-treated by Dr French Ana  . GAD (generalized anxiety disorder) 03/12/2015   -with rare panic disorder related to caring for grandaughter   . GERD (gastroesophageal reflux disease)   . 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 Seasonal allergies   . Spider veins 11/23/2013   Past Surgical History:  Procedure Laterality Date  . BREAST BIOPSY      left  . BREAST LUMPECTOMY  8/99   left breast- benign (lipoma)  . COLONOSCOPY  2005   Dr Lyla Son  . TOE SURGERY     Family History  Problem Relation Age of Onset  . Heart disease Mother   . Diabetes Mother   . Cancer Mother        hodgekins lymphoma  . Diabetes Sister   . Diabetes Maternal Grandmother   . Colon cancer Neg Hx   . Stomach cancer Neg Hx    Social History   Socioeconomic History  . Marital status: Married    Spouse name: Not on file  . Number of children: Not on file  . Years of education: Not on file  . Highest education level: Not on file  Occupational History  . Not on file  Social Needs  . Financial resource strain: Not on file  . Food insecurity    Worry: Not on file    Inability: Not on file  . Transportation needs    Medical: Not on file    Non-medical: Not on file  Tobacco Use  . Smoking status: Never Smoker  . Smokeless tobacco: Never Used  Substance and Sexual Activity  . Alcohol use: Yes    Alcohol/week: 8.0 standard drinks    Types: 8 Glasses of wine per week  . Drug use: No  .  Sexual activity: Yes    Partners: Male    Comment: husband vasectomy  Lifestyle  . Physical activity    Days per week: Not on file    Minutes per session: Not on file  . Stress: Not on file  Relationships  . Social Herbalist on phone: Not on file    Gets together: Not on file    Attends religious service: Not on file    Active member of club or organization: Not on file    Attends meetings of clubs or organizations: Not on file    Relationship status: Not on file  Other Topics Concern  . 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  calcium-vitamin D (OSCAL WITH D) 250-125 MG-UNIT per tablet Take 1 tablet by mouth daily.  . Cyanocobalamin (VITAMIN B 12 PO) Take by mouth as needed.  Marland Kitchen ibuprofen (ADVIL,MOTRIN) 100 MG tablet Take 100 mg by mouth every 6 (six) hours as needed for fever.  . Multiple Vitamins-Minerals (MULTIVITAMIN PO) Take by mouth.  . Probiotic Product (PROBIOTIC PO) Take by mouth as needed.  . triamcinolone (NASACORT) 55 MCG/ACT AERO nasal inhaler Place 2 sprays into the nose as needed.  . valACYclovir (VALTREX) 500 MG tablet TAKE 1 TABLET BY MOUTH TWICE A DAY AS NEEDED  . [DISCONTINUED] amoxicillin-clavulanate (AUGMENTIN) 875-125 MG tablet Take 1 tablet by mouth 2 (two) times daily.   No facility-administered encounter medications on file as of 07/25/2019.     Activities of Daily Living In your present state of health, do you have any difficulty performing the following activities: 07/25/2019  Hearing? N  Vision? N  Difficulty concentrating or making decisions? N  Walking or climbing stairs? N  Dressing or bathing? N  Doing errands, shopping? N  Preparing Food and eating ? N  Using the Toilet? N  In the past six months, have you accidently leaked urine? N  Do you have problems with loss of bowel control? N  Managing your Medications? N  Managing your Finances? N  Housekeeping or managing your Housekeeping? N  Some recent data might be hidden    Patient Care Team: Erin Flaming, MD as PCP - General (Family Medicine) [name], CNM as Consulting Physician (Certified Nurse Midwife) Erin Apo, MD as Consulting Physician (Ophthalmology) [name], Erin Bravo, MD as Consulting Physician (Dermatology)    Assessment:   This is a routine wellness examination for [name].  Exercise Activities and Dietary recommendations Current Exercise Habits: Home exercise routine, Type of exercise: Other - see comments(golfing), Time (Minutes): 45, Frequency (Times/Week): 3, Weekly Exercise  (Minutes/Week): 135, Intensity: Moderate  Goals    . Reduce caffeine intake     Reduce chocolate at bedtime        Fall Risk Fall Risk  07/25/2019 07/21/2018 06/08/2017 09/17/2015 09/17/2015  Falls in the past year? 0 No No No No  Number falls in past yr: 0 - - - -  Injury with Fall? 0 - - - -  Follow up Education provided;Falls prevention discussed;Falls evaluation completed - - - -   Is the patient's home free of loose throw  rugs in walkways, pet beds, electrical cords, etc?   yes      Grab bars in the bathroom? yes      Handrails on the stairs?   yes      Adequate lighting?   yes  Timed Get Up and Go performed: completed and within normal timeframe; no gait abnormalities noted    Depression Screen PHQ 2/9 Scores 07/25/2019 07/21/2018 06/08/2017 09/17/2015  PHQ - 2 Score 0 0 0 0     Cognitive Function-no cognitive concerns at this time    6CIT Screen 07/25/2019  What Year? 0 points  What month? 0 points  What time? 0 points  Count back from 20 0 points  Months in reverse 0 points  Repeat phrase 0 points  Total Score 0    Immunization History  Administered Date(s) Administered  . Fluad Quad(high Dose 65+) 07/25/2019  . Influenza Split 08/19/2011, 07/13/2012  . Influenza Whole 07/21/2008  . Influenza, High Dose Seasonal PF 08/21/2015, 07/16/2016, 07/31/2017, 07/21/2018  . Influenza,inj,Quad PF,6+ Mos 07/12/2013, 07/27/2014  . Pneumococcal Conjugate-13 03/12/2015  . Pneumococcal Polysaccharide-23 07/16/2016  . Td 10/20/1996, 04/14/2008  . Tdap 10/21/2011  . Zoster 10/07/2011    Qualifies for Shingles Vaccine? Discussed and patient will check with pharmacy for coverage.  Patient education handout provided    Screening Tests Health Maintenance  Topic Date Due  . MAMMOGRAM  04/07/2021  . TETANUS/TDAP  10/20/2021  . COLONOSCOPY  08/04/2024  . INFLUENZA VACCINE  Completed  . DEXA SCAN  Completed  . Hepatitis C Screening  Completed  . PNA vac Low Risk Adult   Completed    Cancer Screenings: Lung: Low Dose CT Chest recommended if Age 18-80 years, 30 pack-year currently smoking OR have quit w/in 15years. Patient does not qualify. Breast:  Up to date on Mammogram? Yes   Up to date of Bone Density/Dexa? Yes Colorectal: colonoscopy 08/04/14 with Dr. Olevia Perches    Plan:  I have personally reviewed and addressed the Medicare Annual Wellness questionnaire and have noted the following in the patient's chart:  A. Medical and social history B. Use of alcohol, tobacco or illicit drugs  C. Current medications and supplements D. Functional ability and status E.  Nutritional status F.  Physical activity G. Advance directives H. List of other physicians I.  Hospitalizations, surgeries, and ER visits in previous 12 months J.  [place] such as hearing and vision if needed, cognitive and depression L. Referrals, records requested, and appointments- none   In addition, I have reviewed and discussed with patient certain preventive protocols, quality metrics, and best practice recommendations. A written personalized care plan for preventive services as well as general preventive health recommendations were provided to patient.   Signed,  Erin George, [name]  Nurse Health Advisor   Nurse Notes: Patient would like discuss at her next visit problems with what she describes as palpitations at night.  States that it awakens her at times.  She takes a baby aspirin and it is relieved.  Discussed with her decreasing caffeine close to bedtime.

## 2019-08-15 ENCOUNTER — Encounter: Payer: Self-pay | Admitting: Family Medicine

## 2019-08-15 ENCOUNTER — Ambulatory Visit (INDEPENDENT_AMBULATORY_CARE_PROVIDER_SITE_OTHER): Payer: PPO | Admitting: Family Medicine

## 2019-08-15 ENCOUNTER — Other Ambulatory Visit: Payer: Self-pay

## 2019-08-15 VITALS — BP 180/80 | HR 57 | Temp 97.4°F | Ht 65.0 in | Wt 157.6 lb

## 2019-08-15 DIAGNOSIS — M1710 Unilateral primary osteoarthritis, unspecified knee: Secondary | ICD-10-CM | POA: Insufficient documentation

## 2019-08-15 DIAGNOSIS — K449 Diaphragmatic hernia without obstruction or gangrene: Secondary | ICD-10-CM | POA: Insufficient documentation

## 2019-08-15 DIAGNOSIS — R030 Elevated blood-pressure reading, without diagnosis of hypertension: Secondary | ICD-10-CM | POA: Diagnosis not present

## 2019-08-15 DIAGNOSIS — M179 Osteoarthritis of knee, unspecified: Secondary | ICD-10-CM | POA: Insufficient documentation

## 2019-08-15 DIAGNOSIS — E782 Mixed hyperlipidemia: Secondary | ICD-10-CM

## 2019-08-15 NOTE — Progress Notes (Signed)
Patient: [name] MRN: [id number] DOB: 1951/06/01 PCP: Orma Flaming, MD     Subjective:  Chief Complaint  Patient presents with  . Hyperlipidemia    HPI: The patient is a 68 y.o. female who presents today for hyperlipidemia. She is currently on no medication. She works on lifestyle and diet. Avid golfer, was playing tennis, but stopped this year. No hx of HTN, diabetes or heart disease. No family history of CAD in parents. No hx of smoking. Last ascvd risk was 6.9%.   Blood pressure elevated today and on repeat even more. No hx of high blood pressure and she is asymptomatic.   Review of Systems  Constitutional: Negative for chills, fatigue and fever.  HENT: Negative for dental problem, ear pain, hearing loss and trouble swallowing.   Eyes: Negative for visual disturbance.  Respiratory: Negative for cough, chest tightness and shortness of breath.   Cardiovascular: Negative for chest pain, palpitations and leg swelling.  Gastrointestinal: Negative for abdominal pain, blood in stool, diarrhea, nausea and vomiting.  Endocrine: Negative for cold intolerance, polydipsia, polyphagia and polyuria.  Genitourinary: Negative for dysuria and hematuria.  Musculoskeletal: Negative for arthralgias.  Skin: Negative for rash.  Neurological: Negative for dizziness and headaches.  Psychiatric/Behavioral: Negative for dysphoric mood and sleep disturbance. The patient is not nervous/anxious.     Allergies Patient is allergic to sulfonamide derivatives and cefdinir.  Past Medical History Patient  has a past medical history of Acute meniscal tear of left knee, Asthma, Baker's cyst of knee, GAD (generalized anxiety disorder) (03/12/2015), GERD (gastroesophageal reflux disease), H/O cold sores (03/12/2015), Seasonal allergies, and Spider veins (11/23/2013).  Surgical History Patient  has a past surgical history that includes Breast biopsy; Breast lumpectomy (8/99); Colonoscopy (2005); and Toe  Surgery.  Family History Pateint's family history includes Cancer in her mother; Diabetes in her maternal grandmother, mother, and sister; Heart disease in her mother.  Social History Patient  reports that she has never smoked. She has never used smokeless tobacco. She reports current alcohol use of about 8.0 standard drinks of alcohol per week. She reports that she does not use drugs.    Objective: Vitals:   08/15/19 1053 08/15/19 1129  BP: (!) 144/83 (!) 180/80  Pulse: (!) 57   Temp: (!) 97.4 F (36.3 C)   TempSrc: Skin   SpO2: 97%   Weight: 157 lb 9.6 oz (71.5 kg)   Height: 5\' 5"  (1.651 m)     Body mass index is 26.23 kg/m.  Physical Exam Vitals signs reviewed.  Constitutional:      Appearance: She is well-developed.  HENT:     Right Ear: External ear normal.     Left Ear: External ear normal.  Eyes:     Conjunctiva/sclera: Conjunctivae normal.     Pupils: Pupils are equal, round, and reactive to light.  Neck:     Musculoskeletal: Normal range of motion and neck supple.     Thyroid: No thyromegaly.  Cardiovascular:     Rate and Rhythm: Normal rate and regular rhythm.     Heart sounds: Normal heart sounds. No murmur.  Pulmonary:     Effort: Pulmonary effort is normal.     Breath sounds: Normal breath sounds.  Abdominal:     General: Abdomen is flat. Bowel sounds are normal. There is no distension.     Palpations: Abdomen is soft.     Tenderness: There is no abdominal tenderness.  Lymphadenopathy:     Cervical: No cervical adenopathy.  Skin:    General: Skin is warm and dry.     Findings: No rash.  Neurological:     Mental Status: She is alert and oriented to person, place, and time.     Cranial Nerves: No cranial nerve deficit.     Coordination: Coordination normal.     Deep Tendon Reflexes: Reflexes normal.  Psychiatric:        Mood and Affect: Mood normal.        Behavior: Behavior normal.        Assessment/plan: 1. Mixed hyperlipidemia Last year  ascvd risk 6.9%. not fasting today. Will come back for fasting labs. Very fit and healthy continue healthy lifestyle.  - CBC with Differential/Platelet; Future - Comprehensive metabolic panel; Future - Lipid panel; Future  2. Elevated blood pressure -no hx of elevated blood pressure.  Elevated readings today. Will have her keep a home log for me and will repeat on Friday when she comes in . F/u in one month.     utd on hm.   Return in about 1 month (around 09/15/2019) for blood pressure. Orma Flaming, MD [place]   08/15/2019

## 2019-08-15 NOTE — Patient Instructions (Signed)
So good to see you! Come back for fasting lab work, otherwise see you in a year unless you need anything!  Dr. Rogers Blocker

## 2019-08-19 ENCOUNTER — Ambulatory Visit: Payer: PPO

## 2019-08-19 ENCOUNTER — Other Ambulatory Visit (INDEPENDENT_AMBULATORY_CARE_PROVIDER_SITE_OTHER): Payer: PPO

## 2019-08-19 ENCOUNTER — Other Ambulatory Visit: Payer: PPO

## 2019-08-19 ENCOUNTER — Other Ambulatory Visit: Payer: Self-pay

## 2019-08-19 VITALS — BP 164/90

## 2019-08-19 DIAGNOSIS — E782 Mixed hyperlipidemia: Secondary | ICD-10-CM

## 2019-08-19 DIAGNOSIS — I10 Essential (primary) hypertension: Secondary | ICD-10-CM

## 2019-08-19 LAB — CBC WITH DIFFERENTIAL/PLATELET
Basophils Absolute: 0 10*3/uL (ref 0.0–0.1)
Basophils Relative: 0.8 % (ref 0.0–3.0)
Eosinophils Absolute: 0.2 10*3/uL (ref 0.0–0.7)
Eosinophils Relative: 2.8 % (ref 0.0–5.0)
HCT: 40.6 % (ref 36.0–46.0)
Hemoglobin: 13.6 g/dL (ref 12.0–15.0)
Lymphocytes Relative: 41.2 % (ref 12.0–46.0)
Lymphs Abs: 2.5 10*3/uL (ref 0.7–4.0)
MCHC: 33.6 g/dL (ref 30.0–36.0)
MCV: 97.5 fl (ref 78.0–100.0)
Monocytes Absolute: 0.8 10*3/uL (ref 0.1–1.0)
Monocytes Relative: 13.7 % — ABNORMAL HIGH (ref 3.0–12.0)
Neutro Abs: 2.5 10*3/uL (ref 1.4–7.7)
Neutrophils Relative %: 41.5 % — ABNORMAL LOW (ref 43.0–77.0)
Platelets: 230 10*3/uL (ref 150.0–400.0)
RBC: 4.16 Mil/uL (ref 3.87–5.11)
RDW: 12.9 % (ref 11.5–15.5)
WBC: 6 10*3/uL (ref 4.0–10.5)

## 2019-08-19 LAB — COMPREHENSIVE METABOLIC PANEL
ALT: 20 U/L (ref 0–35)
AST: 21 U/L (ref 0–37)
Albumin: 4 g/dL (ref 3.5–5.2)
Alkaline Phosphatase: 74 U/L (ref 39–117)
BUN: 15 mg/dL (ref 6–23)
CO2: 26 mEq/L (ref 19–32)
Calcium: 9.3 mg/dL (ref 8.4–10.5)
Chloride: 104 mEq/L (ref 96–112)
Creatinine, Ser: 0.65 mg/dL (ref 0.40–1.20)
GFR: 90.22 mL/min (ref >60.00)
Glucose, Bld: 100 mg/dL — ABNORMAL HIGH (ref 70–99)
Potassium: 4.2 mEq/L (ref 3.5–5.1)
Sodium: 137 mEq/L (ref 135–145)
Total Bilirubin: 0.6 mg/dL (ref 0.2–1.2)
Total Protein: 6 g/dL (ref 6.0–8.3)

## 2019-08-19 LAB — LIPID PANEL
Cholesterol: 226 mg/dL — ABNORMAL HIGH (ref 0–200)
HDL: 61.6 mg/dL (ref >39.00)
LDL Cholesterol: 137 mg/dL — ABNORMAL HIGH (ref 0–99)
NonHDL: 164.18
Total CHOL/HDL Ratio: 4
Triglycerides: 138 mg/dL (ref 0.0–149.0)
VLDL: 27.6 mg/dL (ref 0.0–40.0)

## 2019-08-19 NOTE — Progress Notes (Signed)
Patient presented today for blood pressure check.  Upon initial check, BP was 148/88 in left arm, sitting with regular adult cuff.  Upon recheck, BP was 164/90, left arm, sitting, regular adult cuff.   Water quality scientist, [place]

## 2019-08-31 ENCOUNTER — Ambulatory Visit: Payer: PPO | Admitting: Family Medicine

## 2019-09-28 ENCOUNTER — Ambulatory Visit (INDEPENDENT_AMBULATORY_CARE_PROVIDER_SITE_OTHER): Payer: PPO | Admitting: Family Medicine

## 2019-09-28 ENCOUNTER — Encounter: Payer: Self-pay | Admitting: Family Medicine

## 2019-09-28 VITALS — BP 192/80 | HR 60 | Temp 98.2°F | Ht 65.0 in | Wt 160.8 lb

## 2019-09-28 DIAGNOSIS — I10 Essential (primary) hypertension: Secondary | ICD-10-CM

## 2019-09-28 MED ORDER — LISINOPRIL 20 MG PO TABS: 20.0000 mg | ORAL_TABLET | Freq: Every day | ORAL | 0 refills | Status: DC

## 2019-09-28 NOTE — Patient Instructions (Signed)
-for your blood pressure we are going to send in lisinopril for you to take 20mg  once a day with breakfast. After 2 weeks if blood pressure is still quite high >150/90, I want you to increase to 2 pills/day (40mg ). See you back in 6 weeks for recheck, call with any questions in the meantime.   -get a cuff and cut out salt  Merry christmas!  Dr. Rogers Blocker      Hypertension, Adult Hypertension is another name for high blood pressure. High blood pressure forces your heart to work harder to pump blood. This can cause problems over time. There are two numbers in a blood pressure reading. There is a top number (systolic) over a bottom number (diastolic). It is best to have a blood pressure that is below 120/80. Healthy choices can help lower your blood pressure, or you may need medicine to help lower it. What are the causes? The cause of this condition is not known. Some conditions may be related to high blood pressure. What increases the risk?  Smoking.  Having type 2 diabetes mellitus, high cholesterol, or both.  Not getting enough exercise or physical activity.  Being overweight.  Having too much fat, sugar, calories, or salt (sodium) in your diet.  Drinking too much alcohol.  Having long-term (chronic) kidney disease.  Having a family history of high blood pressure.  Age. Risk increases with age.  Race. You may be at higher risk if you are African American.  Gender. Men are at higher risk than women before age 58. After age 32, women are at higher risk than men.  Having obstructive sleep apnea.  Stress. What are the signs or symptoms?  High blood pressure may not cause symptoms. Very high blood pressure (hypertensive crisis) may cause: ? Headache. ? Feelings of worry or nervousness (anxiety). ? Shortness of breath. ? Nosebleed. ? A feeling of being sick to your stomach (nausea). ? Throwing up (vomiting). ? Changes in how you see. ? Very bad chest pain. ? Seizures. How  is this treated?  This condition is treated by making healthy lifestyle changes, such as: ? Eating healthy foods. ? Exercising more. ? Drinking less alcohol.  Your health care provider may prescribe medicine if lifestyle changes are not enough to get your blood pressure under control, and if: ? Your top number is above 130. ? Your bottom number is above 80.  Your personal target blood pressure may vary. Follow these instructions at home: Eating and drinking   If told, follow the DASH eating plan. To follow this plan: ? Fill one half of your plate at each meal with fruits and vegetables. ? Fill one fourth of your plate at each meal with whole grains. Whole grains include whole-wheat pasta, brown rice, and whole-grain bread. ? Eat or drink low-fat dairy products, such as skim milk or low-fat yogurt. ? Fill one fourth of your plate at each meal with low-fat (lean) proteins. Low-fat proteins include fish, chicken without skin, eggs, beans, and tofu. ? Avoid fatty meat, cured and processed meat, or chicken with skin. ? Avoid pre-made or processed food.  Eat less than 1,500 mg of salt each day.  Do not drink alcohol if: ? Your doctor tells you not to drink. ? You are pregnant, may be pregnant, or are planning to become pregnant.  If you drink alcohol: ? Limit how much you use to:  0-1 drink a day for women.  0-2 drinks a day for men. ? Be aware  of how much alcohol is in your drink. In the [place] one drink equals one 12 oz bottle of beer (355 mL), one 5 oz glass of wine (148 mL), or one 1 oz glass of hard liquor (44 mL). Lifestyle   Work with your doctor to stay at a healthy weight or to lose weight. Ask your doctor what the best weight is for you.  Get at least 30 minutes of exercise most days of the week. This may include walking, swimming, or biking.  Get at least 30 minutes of exercise that strengthens your muscles (resistance exercise) at least 3 days a week. This may  include lifting weights or doing Pilates.  Do not use any products that contain nicotine or tobacco, such as cigarettes, e-cigarettes, and chewing tobacco. If you need help quitting, ask your doctor.  Check your blood pressure at home as told by your doctor.  Keep all follow-up visits as told by your doctor. This is important. Medicines  Take over-the-counter and prescription medicines only as told by your doctor. Follow directions carefully.  Do not skip doses of blood pressure medicine. The medicine does not work as well if you skip doses. Skipping doses also puts you at risk for problems.  Ask your doctor about side effects or reactions to medicines that you should watch for. Contact a doctor if you:  Think you are having a reaction to the medicine you are taking.  Have headaches that keep coming back (recurring).  Feel dizzy.  Have swelling in your ankles.  Have trouble with your vision. Get help right away if you:  Get a very bad headache.  Start to feel mixed up (confused).  Feel weak or numb.  Feel faint.  Have very bad pain in your: ? Chest. ? Belly (abdomen).  Throw up more than once.  Have trouble breathing. Summary  Hypertension is another name for high blood pressure.  High blood pressure forces your heart to work harder to pump blood.  For most people, a normal blood pressure is less than 120/80.  Making healthy choices can help lower blood pressure. If your blood pressure does not get lower with healthy choices, you may need to take medicine. This information is not intended to replace advice given to you by your health care provider. Make sure you discuss any questions you have with your health care provider. Document Released: 03/24/2008 Document Revised: 06/16/2018 Document Reviewed: 06/16/2018 Elsevier Patient Education  2020 [employer].

## 2019-09-28 NOTE — Progress Notes (Signed)
Patient: [name] MRN: [id number] DOB: [birth year] PCP: Orma Flaming, MD     Subjective:  Chief Complaint  Patient presents with  . Hypertension    HPI: The patient is a 68 y.o. female who presents today for follow up of HTN. This is a new diagnosis and is here for baseline ekg and initiation of treatment.   Hypertension: Here for follow up of hypertension.  Currently on no medication. Home readings range from A999333 systolic/ 80 diastolic.  Exercise includes golfing and a lot of walking. Weight has been stable. Denies any chest pain, headaches, shortness of breath, vision changes, swelling in lower extremities. She tried to keep a log, but her machine broke.    Review of Systems  Constitutional: Negative for chills, fatigue and fever.  HENT: Negative for dental problem, ear pain, hearing loss and trouble swallowing.   Eyes: Negative for visual disturbance.  Respiratory: Negative for cough, chest tightness and shortness of breath.   Cardiovascular: Negative for chest pain, palpitations and leg swelling.  Gastrointestinal: Negative for abdominal pain, blood in stool, diarrhea and nausea.  Endocrine: Negative for cold intolerance, polydipsia, polyphagia and polyuria.  Genitourinary: Negative for dysuria and hematuria.  Musculoskeletal: Negative for arthralgias.  Skin: Negative for rash.  Neurological: Negative for dizziness and headaches.  Psychiatric/Behavioral: Negative for dysphoric mood and sleep disturbance. The patient is not nervous/anxious.     Allergies Patient is allergic to sulfonamide derivatives and cefdinir.  Past Medical History Patient  has a past medical history of Acute meniscal tear of left knee, Asthma, Baker's cyst of knee, GAD (generalized anxiety disorder) (03/12/2015), GERD (gastroesophageal reflux disease), H/O cold sores (03/12/2015), Seasonal allergies, and Spider veins (11/23/2013).  Surgical History Patient  has a past surgical history that  includes Breast biopsy; Breast lumpectomy (8/99); Colonoscopy (2005); and Toe Surgery.  Family History Pateint's family history includes Cancer in her mother; Diabetes in her maternal grandmother, mother, and sister; Heart disease in her mother.  Social History Patient  reports that she has never smoked. She has never used smokeless tobacco. She reports current alcohol use of about 8.0 standard drinks of alcohol per week. She reports that she does not use drugs.    Objective: Vitals:   09/28/19 1259 09/28/19 1326  BP: (!) 190/84 (!) 192/80  Pulse: 60   Temp: 98.2 F (36.8 C)   SpO2: 96%   Weight: 160 lb 12.8 oz (72.9 kg)   Height: 5\' 5"  (1.651 m)     Body mass index is 26.76 kg/m.  Physical Exam Vitals signs reviewed.  Constitutional:      Appearance: Normal appearance. She is well-developed and normal weight.  HENT:     Head: Normocephalic and atraumatic.     Right Ear: External ear normal.     Left Ear: External ear normal.  Eyes:     Conjunctiva/sclera: Conjunctivae normal.     Pupils: Pupils are equal, round, and reactive to light.  Neck:     Musculoskeletal: Normal range of motion and neck supple.     Thyroid: No thyromegaly.     Vascular: No carotid bruit.  Cardiovascular:     Rate and Rhythm: Normal rate and regular rhythm.     Heart sounds: Normal heart sounds. No murmur.  Pulmonary:     Effort: Pulmonary effort is normal.     Breath sounds: Normal breath sounds.  Abdominal:     General: Abdomen is flat. Bowel sounds are normal. There is no distension.  Palpations: Abdomen is soft.     Tenderness: There is no abdominal tenderness.  Lymphadenopathy:     Cervical: No cervical adenopathy.  Skin:    General: Skin is warm and dry.     Findings: No rash.  Neurological:     General: No focal deficit present.     Mental Status: She is alert and oriented to person, place, and time.     Cranial Nerves: No cranial nerve deficit.     Coordination: Coordination  normal.     Deep Tendon Reflexes: Reflexes normal.  Psychiatric:        Mood and Affect: Mood normal.        Behavior: Behavior normal.    Ekg: nrs with rate of 60      Assessment/plan: 1. Essential hypertension Extremely elevated. No log. Again, recommended she get a cuff so I can monitor her home readings. Starting her on lisinopril 20mg . After 2-3 weeks if still running >150-16/9 she is to increase to 40mg . Side effects of ACE-I discussed including dry cough and angioedema. She is to call me if feels like they have a dry cough and they are to call 911 or go to ER if any signs/symptoms of angioedema. Risks of uncontrolled HTN discussed. Continue exercise, low salt diet as well. HTN urgency precautions given. She remains asymptomatic.   - EKG 12-Lead  -ASCVD risk is now over 20%. I do think she needs a statin, but we will get blood pressure more controlled before adding multiple medications at one time.    This visit occurred during the SARS-CoV-2 public health emergency.  Safety protocols were in place, including screening questions prior to the visit, additional usage of staff PPE, and extensive cleaning of exam room while observing appropriate contact time as indicated for disinfecting solutions.    Return in about 6 weeks (around 11/09/2019) for blood pressure .   Orma Flaming, MD [place]  09/28/2019

## 2019-10-13 ENCOUNTER — Encounter: Payer: Self-pay | Admitting: Family Medicine

## 2019-11-04 ENCOUNTER — Encounter: Payer: Self-pay | Admitting: Certified Nurse Midwife

## 2019-11-04 ENCOUNTER — Ambulatory Visit (INDEPENDENT_AMBULATORY_CARE_PROVIDER_SITE_OTHER): Payer: PPO | Admitting: Certified Nurse Midwife

## 2019-11-04 ENCOUNTER — Other Ambulatory Visit: Payer: Self-pay

## 2019-11-04 VITALS — BP 120/70 | HR 68 | Temp 98.5°F | Resp 16 | Ht 64.75 in | Wt 159.0 lb

## 2019-11-04 DIAGNOSIS — Z01419 Encounter for gynecological examination (general) (routine) without abnormal findings: Secondary | ICD-10-CM

## 2019-11-04 NOTE — Progress Notes (Signed)
69 y.o. G75P2002 Married  Caucasian Fe here for annual exam. Post menopausal no vaginal bleeding or dryness. Sees Dr. Rogers Blocker for Aex and labs, taking Lisinopril for hypertension, tolerating well. No HSV outbreaks. Feels she may have had Covid early in 2020. Still playing golf and exercises daily. Lipoma on left shoulder has grown slightly considering removal if bothers her. Had her first [place] in [place] this year! No other health issues today.  Patient's last menstrual period was 10/20/2000.          Sexually active: Yes.    The current method of family planning is vasectomy.    Exercising: Yes.    golf, treadmill, weights Smoker:  no  Review of Systems  Constitutional: Negative.   HENT: Negative.   Eyes: Negative.   Respiratory: Negative.   Cardiovascular: Negative.   Gastrointestinal: Negative.   Genitourinary: Negative.   Musculoskeletal: Negative.   Skin: Negative.   Neurological: Negative.   Endo/Heme/Allergies: Negative.   Psychiatric/Behavioral: Negative.     Health Maintenance: Pap:  10-09-16 neg, 11-03-2018 neg HPV HR neg History of Abnormal Pap: no MMG:  04-11-2019 category b density birads 1:neg Self Breast exams: yes Colonoscopy: 2015 neg f/u 46yrs BMD:  2020 TDaP: 2013 Shingles: 08-04-2019 1st dose Pneumonia: 2017 Hep C and HIV: hep c neg 2016 Labs: with PCP   reports that she has never smoked. She has never used smokeless tobacco. She reports current alcohol use of about 8.0 standard drinks of alcohol per week. She reports that she does not use drugs.  Past Medical History:  Diagnosis Date  . Acute meniscal tear of left knee    treated by Dr Katha Hamming date pending  . Asthma    as a child  . Baker's cyst of knee    left knee-treated by Dr French Ana  . GAD (generalized anxiety disorder) 03/12/2015   -with rare panic disorder related to caring for grandaughter   . GERD (gastroesophageal reflux disease)   . 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 Seasonal allergies    . Spider veins 11/23/2013    Past Surgical History:  Procedure Laterality Date  . BREAST BIOPSY     left  . BREAST LUMPECTOMY  8/99   left breast- benign (lipoma)  . COLONOSCOPY  2005   Dr Lyla Son  . TOE SURGERY      Current Outpatient Medications  Medication Sig Dispense Refill  . calcium-vitamin D (OSCAL WITH D) 250-125 MG-UNIT per tablet Take 1 tablet by mouth daily.    . Cyanocobalamin (VITAMIN B 12 PO) Take by mouth as needed.    Marland Kitchen ibuprofen (ADVIL,MOTRIN) 100 MG tablet Take 100 mg by mouth every 6 (six) hours as needed for fever.    Marland Kitchen lisinopril (ZESTRIL) 20 MG tablet Take 1 tablet (20 mg total) by mouth daily. 90 tablet 0  . Multiple Vitamins-Minerals (MULTIVITAMIN PO) Take by mouth.    . Probiotic Product (PROBIOTIC PO) Take by mouth as needed.    . triamcinolone (NASACORT) 55 MCG/ACT AERO nasal inhaler Place 2 sprays into the nose as needed.    . valACYclovir (VALTREX) 500 MG tablet TAKE 1 TABLET BY MOUTH TWICE A DAY AS NEEDED 30 tablet 12   No current facility-administered medications for this visit.    Family History  Problem Relation Age of Onset  . Heart disease Mother   . Diabetes Mother   . Cancer Mother        hodgekins lymphoma  . Diabetes Sister   .  Diabetes Maternal Grandmother   . Colon cancer Neg Hx   . Stomach cancer Neg Hx     ROS:  Pertinent items are noted in HPI.  Otherwise, a comprehensive ROS was negative.  Exam:   LMP 10/20/2000    Ht Readings from Last 3 Encounters:  09/28/19 5\' 5"  (1.651 m)  08/15/19 5\' 5"  (1.651 m)  07/25/19 5\' 5"  (1.651 m)    General appearance: alert, cooperative and appears stated age Head: Normocephalic, without obvious abnormality, atraumatic Neck: no adenopathy, supple, symmetrical, trachea midline and thyroid normal to inspection and palpation Lungs: clear to auscultation bilaterally Breasts: normal appearance, no masses or tenderness, No nipple retraction or dimpling, No nipple discharge or bleeding, No  axillary or supraclavicular adenopathy Heart: regular rate and rhythm Abdomen: soft, non-tender; no masses,  no organomegaly Extremities: extremities normal, atraumatic, no cyanosis or edema Skin: Skin color, texture, turgor normal. No rashes or lesions Lymph nodes: Cervical, supraclavicular, and axillary nodes normal. No abnormal inguinal nodes palpated Neurologic: Grossly normal   Pelvic: External genitalia:  no lesions              Urethra:  normal appearing urethra with no masses, tenderness or lesions              Bartholin's and Skene's: normal                 Vagina: normal appearing vagina with normal color and discharge, no lesions              Cervix: no cervical motion tenderness, no lesions and normal appearance              Pap taken: No. Bimanual Exam:  Uterus:  normal size, contour, position, consistency, mobility, non-tender and anteverted              Adnexa: normal adnexa and no mass, fullness, tenderness               Rectovaginal: Confirms               Anus:  normal sphincter tone, no lesions  Chaperone present: yes  A:  Well Woman with normal exam  Post menopausal no HRT  Hypertension now on medication working well with PCP management  No HSV outbreaks does not need Rx update  P:   Reviewed health and wellness pertinent to exam  Aware of need to advise if vaginal bleeding  Continue follow up with PCP as indicated  Will call if needed  Pap smear: no   counseled on breast self exam, mammography screening, feminine hygiene, adequate intake of calcium and vitamin D, diet and exercise, Kegel's exercises  return annually or prn  An After Visit Summary was printed and given to the patient.

## 2019-11-04 NOTE — Patient Instructions (Addendum)
EXERCISE AND DIET:  We recommended that you start or continue a regular exercise program for good health. Regular exercise means any activity that makes your heart beat faster and makes you sweat.  We recommend exercising at least 30 minutes per day at least 3 days a week, preferably 4 or 5.  We also recommend a diet low in fat and sugar.  Inactivity, poor dietary choices and obesity can cause diabetes, heart attack, stroke, and kidney damage, among others.    ALCOHOL AND SMOKING:  Women should limit their alcohol intake to no more than 7 drinks/beers/glasses of wine (combined, not each!) per week. Moderation of alcohol intake to this level decreases your risk of breast cancer and liver damage. And of course, no recreational drugs are part of a healthy lifestyle.  And absolutely no smoking or even second hand smoke. Most people know smoking can cause heart and lung diseases, but did you know it also contributes to weakening of your bones? Aging of your skin?  Yellowing of your teeth and nails?  CALCIUM AND VITAMIN D:  Adequate intake of calcium and Vitamin D are recommended.  The recommendations for exact amounts of these supplements seem to change often, but generally speaking 600 mg of calcium (either carbonate or citrate) and 800 units of Vitamin D per day seems prudent. Certain women may benefit from higher intake of Vitamin D.  If you are among these women, your doctor will have told you during your visit.    PAP SMEARS:  Pap smears, to check for cervical cancer or precancers,  have traditionally been done yearly, although recent scientific advances have shown that most women can have pap smears less often.  However, every woman still should have a physical exam from her gynecologist every year. It will include a breast check, inspection of the vulva and vagina to check for abnormal growths or skin changes, a visual exam of the cervix, and then an exam to evaluate the size and shape of the uterus and  ovaries.  And after 70 years of age, a rectal exam is indicated to check for rectal cancers. We will also provide age appropriate advice regarding health maintenance, like when you should have certain vaccines, screening for sexually transmitted diseases, bone density testing, colonoscopy, mammograms, etc.   MAMMOGRAMS:  All women over 40 years old should have a yearly mammogram. Many facilities now offer a "3D" mammogram, which may cost around $50 extra out of pocket. If possible,  we recommend you accept the option to have the 3D mammogram performed.  It both reduces the number of women who will be called back for extra views which then turn out to be normal, and it is better than the routine mammogram at detecting truly abnormal areas.    COLONOSCOPY:  Colonoscopy to screen for colon cancer is recommended for all women at age 50.  We know, you hate the idea of the prep.  We agree, BUT, having colon cancer and not knowing it is worse!!  Colon cancer so often starts as a polyp that can be seen and removed at colonscopy, which can quite literally save your life!  And if your first colonoscopy is normal and you have no family history of colon cancer, most women don't have to have it again for 10 years.  Once every ten years, you can do something that may end up saving your life, right?  We will be happy to help you get it scheduled when you are ready.    Be sure to check your insurance coverage so you understand how much it will cost.  It may be covered as a preventative service at no cost, but you should check your particular policy.      Lipoma  A lipoma is a noncancerous (benign) tumor that is made up of fat cells. This is a very common type of soft-tissue growth. Lipomas are usually found under the skin (subcutaneous). They may occur in any tissue of the body that contains fat. Common areas for lipomas to appear include the back, arms, shoulders, buttocks, and thighs. Lipomas grow slowly, and they are  usually painless. Most lipomas do not cause problems and do not require treatment. What are the causes? The cause of this condition is not known. What increases the risk? You are more likely to develop this condition if:  You are 40-60 years old.  You have a family history of lipomas. What are the signs or symptoms? A lipoma usually appears as a small, round bump under the skin. In most cases, the lump will:  Feel soft or rubbery.  Not cause pain or other symptoms. However, if a lipoma is located in an area where it pushes on nerves, it can become painful or cause other symptoms. How is this diagnosed? A lipoma can usually be diagnosed with a physical exam. You may also have tests to confirm the diagnosis and to rule out other conditions. Tests may include:  Imaging tests, such as a CT scan or an MRI.  Removal of a tissue sample to be looked at under a microscope (biopsy). How is this treated? Treatment for this condition depends on the size of the lipoma and whether it is causing any symptoms.  For small lipomas that are not causing problems, no treatment is needed.  If a lipoma is bigger or it causes problems, surgery may be done to remove the lipoma. Lipomas can also be removed to improve appearance. Most often, the procedure is done after applying a medicine that numbs the area (local anesthetic).  Liposuction may be done to reduce the size of the lipoma before it is removed through surgery, or it may be done to remove the lipoma. Lipomas are removed with this method in order to limit incision size and scarring. A liposuction tube is inserted through a small incision into the lipoma, and the contents of the lipoma are removed through the tube with suction. Follow these instructions at home:  Watch your lipoma for any changes.  Keep all follow-up visits as told by your health care provider. This is important. Contact a health care provider if:  Your lipoma becomes larger or  hard.  Your lipoma becomes painful, red, or increasingly swollen. These could be signs of infection or a more serious condition. Get help right away if:  You develop tingling or numbness in an area near the lipoma. This could indicate that the lipoma is causing nerve damage. Summary  A lipoma is a noncancerous tumor that is made up of fat cells.  Most lipomas do not cause problems and do not require treatment.  If a lipoma is bigger or it causes problems, surgery may be done to remove the lipoma.  Contact a health care provider if your lipoma becomes larger or hard, or if it becomes painful, red, or increasingly swollen. Pain, redness, and swelling could be signs of infection or a more serious condition. This information is not intended to replace advice given to you by your health care provider.   Make sure you discuss any questions you have with your health care provider. Document Revised: 05/23/2019 Document Reviewed: 05/23/2019 Elsevier Patient Education  2020 [employer]  

## 2019-11-08 ENCOUNTER — Other Ambulatory Visit: Payer: PPO

## 2019-11-15 ENCOUNTER — Encounter: Payer: Self-pay | Admitting: Family Medicine

## 2019-11-15 NOTE — Telephone Encounter (Signed)
Updated in Historical Documents.

## 2019-11-21 ENCOUNTER — Encounter: Payer: Self-pay | Admitting: Family Medicine

## 2019-11-21 ENCOUNTER — Other Ambulatory Visit: Payer: Self-pay

## 2019-11-21 ENCOUNTER — Ambulatory Visit (INDEPENDENT_AMBULATORY_CARE_PROVIDER_SITE_OTHER): Payer: PPO | Admitting: Family Medicine

## 2019-11-21 VITALS — BP 140/78 | HR 66 | Temp 97.4°F | Ht 64.75 in | Wt 162.2 lb

## 2019-11-21 DIAGNOSIS — I10 Essential (primary) hypertension: Secondary | ICD-10-CM

## 2019-11-21 DIAGNOSIS — E782 Mixed hyperlipidemia: Secondary | ICD-10-CM

## 2019-11-21 MED ORDER — ROSUVASTATIN CALCIUM 5 MG PO TABS: 5.0000 mg | ORAL_TABLET | Freq: Every day | ORAL | 3 refills | Status: DC

## 2019-11-21 NOTE — Patient Instructions (Signed)
-  keep up the lisinopril 20mg /day. Call in when you need a refill. You're great though!  -cholesterol medication sent in for you to take at night daily. It's called crestor and sent in lowest dosage of 5mg . Let me know if too expensive! Side effects include muscle cramping, rash, stomach pain, dark urine. Stop if this happens.   See you back in 6 months for routine f/u.

## 2019-11-21 NOTE — Progress Notes (Signed)
Patient: [name] MRN: [id number] DOB: October 15, 1951 PCP: Orma Flaming, MD     Subjective:  Chief Complaint  Patient presents with  . Hypertension    HPI: The patient is a 69 y.o. female who presents today for Hypertension follow up.   Hypertension: Here for follow up of hypertension.  Currently on lisinopril 20mg . Home readings range from [id number] diastolic. Takes medication as prescribed and denies any side effects. Exercise includes golfing and walking. Weight has been stable. Denies any chest pain, headaches, shortness of breath, vision changes, swelling in lower extremities.   Her ASCVD risk is also 13.7%.   Review of Systems  Constitutional: Negative for chills, fatigue and fever.  HENT: Negative for dental problem, ear pain, hearing loss and trouble swallowing.   Eyes: Negative for visual disturbance.  Respiratory: Negative for cough, chest tightness and shortness of breath.   Cardiovascular: Negative for chest pain, palpitations and leg swelling.  Gastrointestinal: Negative for abdominal pain, blood in stool, diarrhea and nausea.  Endocrine: Negative for cold intolerance, polydipsia, polyphagia and polyuria.  Genitourinary: Negative for dysuria and hematuria.  Musculoskeletal: Negative for arthralgias.  Skin: Negative for rash.  Neurological: Negative for dizziness and headaches.  Psychiatric/Behavioral: Negative for dysphoric mood and sleep disturbance. The patient is not nervous/anxious.     Allergies Patient is allergic to sulfonamide derivatives and cefdinir.  Past Medical History Patient  has a past medical history of Acute meniscal tear of left knee, Asthma, Baker's cyst of knee, GAD (generalized anxiety disorder) (03/12/2015), GERD (gastroesophageal reflux disease), H/O cold sores (03/12/2015), Seasonal allergies, and Spider veins (11/23/2013).  Surgical History Patient  has a past surgical history that includes Breast biopsy; Breast lumpectomy (8/99);  Colonoscopy (2005); Toe Surgery; and Dental surgery.  Family History Pateint's family history includes Cancer in her mother; Diabetes in her maternal grandmother, mother, and sister; Heart disease in her mother.  Social History Patient  reports that she has never smoked. She has never used smokeless tobacco. She reports current alcohol use of about 8.0 standard drinks of alcohol per week. She reports that she does not use drugs.    Objective: Vitals:   11/21/19 1351  BP: 140/78  Pulse: 66  Temp: (!) 97.4 F (36.3 C)  TempSrc: Temporal  SpO2: 99%  Weight: 162 lb 3.2 oz (73.6 kg)  Height: 5' 4.75" (1.645 m)    Body mass index is 27.2 kg/m.  Physical Exam     Assessment/plan: 1. Essential hypertension Blood pressure is to goal. Continue current anti-hypertensive medications. Refills not given and routine lab work will not be done today. Recommended routine exercise and healthy diet including DASH diet and mediterranean diet. Encouraged weight loss. F/u in 6 months.    2. Mixed hyperlipidemia ascvd risk is 13.7%. starting crestor. Side effects discussed. Let me know if too expensive. F/u labs in 6 months.     This visit occurred during the SARS-CoV-2 public health emergency.  Safety protocols were in place, including screening questions prior to the visit, additional usage of staff PPE, and extensive cleaning of exam room while observing appropriate contact time as indicated for disinfecting solutions.   Total time of encounter:  25 minutes total time of encounter, including 15 minutes spent in face-to-face patient care. This time includes coordination of care and counseling regarding blood pressure treatment and cholesterol treatment and medication. Remainder of non-face-to-face time involved reviewing chart documents/testing relevant to the patient encounter and documentation in the medical record.   Return in about  6 months (around 05/20/2020) for routine f/u .   Orma Flaming, MD [place]   11/21/2019

## 2019-11-24 ENCOUNTER — Encounter: Payer: Self-pay | Admitting: Family Medicine

## 2019-11-25 ENCOUNTER — Encounter: Payer: Self-pay | Admitting: Family Medicine

## 2019-11-26 NOTE — Telephone Encounter (Signed)
Spoke to pt told her I was calling about her My Chart message. Asked pt is she scheduled for the Covid vaccine? Pt said yes she is scheduled to have it on the 11th and she had her last Shingrix vaccine on Jan 27th. Told her according to the guidelines we were given vaccines should be 14 days since last vaccine to be effective. So I would recommend you post pone your Covid vaccine by at least a week. Pt verbalized understanding and will call and reschedule.

## 2019-11-27 ENCOUNTER — Ambulatory Visit: Payer: PPO

## 2019-11-28 ENCOUNTER — Ambulatory Visit: Payer: PPO

## 2019-12-02 ENCOUNTER — Ambulatory Visit: Payer: PPO | Attending: Internal Medicine

## 2019-12-02 DIAGNOSIS — Z23 Encounter for immunization: Secondary | ICD-10-CM | POA: Insufficient documentation

## 2019-12-02 NOTE — Progress Notes (Signed)
   Covid-19 Vaccination Clinic  Name:  [name]    MRN: [id number] DOB: Nov 07, 1950  12/02/2019  [name] was observed post Covid-19 immunization for 15 minutes without incidence. She was provided with Vaccine Information Sheet and instruction to access the V-Safe system.   [name] was instructed to call 911 with any severe reactions post vaccine: Marland Kitchen Difficulty breathing  . Swelling of your face and throat  . A fast heartbeat  . A bad rash all over your body  . Dizziness and weakness    Immunizations Administered    Name Date Dose VIS Date Route   Pfizer COVID-19 Vaccine 12/02/2019 10:09 AM 0.3 mL 09/30/2019 Intramuscular   Manufacturer: [place]   Lot: [id number]   [place]: [id number]

## 2019-12-13 ENCOUNTER — Ambulatory Visit: Payer: PPO

## 2019-12-16 ENCOUNTER — Encounter: Payer: Self-pay | Admitting: Family Medicine

## 2019-12-16 NOTE — Telephone Encounter (Signed)
Please Advise

## 2019-12-21 ENCOUNTER — Other Ambulatory Visit: Payer: Self-pay | Admitting: Family Medicine

## 2019-12-24 ENCOUNTER — Ambulatory Visit: Payer: PPO | Attending: Internal Medicine

## 2019-12-24 DIAGNOSIS — Z23 Encounter for immunization: Secondary | ICD-10-CM | POA: Insufficient documentation

## 2019-12-24 NOTE — Progress Notes (Signed)
   Cov[facility] Vaccination [facility]  Name:  [name]    MRN: [id number] DOB: October 11, 1951  12/24/2019  [name] was observed post Covid-19 immunization for 15 minutes without incident. She was provided with Vaccine Information Sheet and instruction to access the V-Safe system.   [name] was instructed to call 911 with any severe reactions post vaccine: Marland Kitchen Difficulty breathing  . Swelling of face and throat  . A fast heartbeat  . A bad rash all over body  . Dizziness and weakness   Immunizations Administered    Name Date Dose VIS Date Route   Pfizer COVID-19 Vaccine 12/24/2019  3:02 PM 0.3 mL 09/30/2019 Intramuscular   Manufacturer:    Lot: [id number]   [place]: [id number]

## 2020-01-10 ENCOUNTER — Encounter: Payer: Self-pay | Admitting: Certified Nurse Midwife

## 2020-01-30 DIAGNOSIS — M1711 Unilateral primary osteoarthritis, right knee: Secondary | ICD-10-CM | POA: Diagnosis not present

## 2020-02-09 DIAGNOSIS — M1711 Unilateral primary osteoarthritis, right knee: Secondary | ICD-10-CM | POA: Diagnosis not present

## 2020-02-16 DIAGNOSIS — M1711 Unilateral primary osteoarthritis, right knee: Secondary | ICD-10-CM | POA: Diagnosis not present

## 2020-03-01 DIAGNOSIS — M1711 Unilateral primary osteoarthritis, right knee: Secondary | ICD-10-CM | POA: Diagnosis not present

## 2020-03-30 ENCOUNTER — Telehealth: Payer: PPO | Admitting: Nurse Practitioner

## 2020-03-30 DIAGNOSIS — L255 Unspecified contact dermatitis due to plants, except food: Secondary | ICD-10-CM

## 2020-03-30 MED ORDER — PREDNISONE 10 MG (21) PO TBPK: ORAL_TABLET | ORAL | 0 refills | Status: AC

## 2020-03-30 NOTE — Progress Notes (Signed)

## 2020-04-05 ENCOUNTER — Ambulatory Visit: Payer: PPO | Admitting: Family Medicine

## 2020-04-05 ENCOUNTER — Telehealth: Payer: Self-pay | Admitting: Family Medicine

## 2020-04-05 MED ORDER — HYDROXYZINE HCL 25 MG PO TABS: 25.0000 mg | ORAL_TABLET | Freq: Three times a day (TID) | ORAL | 0 refills | Status: DC | PRN

## 2020-04-05 MED ORDER — TRIAMCINOLONE ACETONIDE 0.1 % EX CREA: TOPICAL_CREAM | CUTANEOUS | 0 refills | Status: DC

## 2020-04-05 NOTE — Addendum Note (Signed)
Addended by: Orma Flaming on: 04/05/2020 11:06 AM   Modules accepted: Orders

## 2020-04-05 NOTE — Telephone Encounter (Signed)
Caller reports that she was prescribed prednisone for poison ivy last week. She states she is still having a lot of itching and is unable to sleep. Additional Comment Caller would like to see if Dr Rogers Blocker may be able to call in something else for her. Caller given office hours to follow up Disp. Time Disposition Final User 04/05/2020 7:38:47 AM General Information Provided Yes [name] Call Closed By: [name] Transaction Date/Time: 04/05/2020 7:35:31 AM (ET)

## 2020-04-05 NOTE — Telephone Encounter (Signed)
Yes I sent in a pill called hydroxyzine. Due to how poison ivy works likely won't help with the itch, but will help her sleep. I also sent in topical steroid cream she can put on poison ivy, but can not use on her face! Use for 7-10 days. Poison ivy will go away after 3 weeks.. I know it stinks!   Dr. Rogers Blocker

## 2020-04-05 NOTE — Telephone Encounter (Signed)
I spoke with the pt to give the message below. Pt verbalized understanding. She will pick up prescription and cancel her appointment for today.

## 2020-04-05 NOTE — Telephone Encounter (Signed)
Please see message and advise 

## 2020-04-30 ENCOUNTER — Ambulatory Visit: Payer: PPO | Admitting: Family Medicine

## 2020-05-02 ENCOUNTER — Ambulatory Visit (INDEPENDENT_AMBULATORY_CARE_PROVIDER_SITE_OTHER): Payer: PPO | Admitting: Family Medicine

## 2020-05-02 ENCOUNTER — Other Ambulatory Visit: Payer: Self-pay

## 2020-05-02 ENCOUNTER — Encounter: Payer: Self-pay | Admitting: Family Medicine

## 2020-05-02 VITALS — BP 130/70 | HR 62 | Temp 97.2°F | Ht 64.75 in | Wt 160.2 lb

## 2020-05-02 DIAGNOSIS — F329 Major depressive disorder, single episode, unspecified: Secondary | ICD-10-CM | POA: Diagnosis not present

## 2020-05-02 DIAGNOSIS — F419 Anxiety disorder, unspecified: Secondary | ICD-10-CM | POA: Diagnosis not present

## 2020-05-02 MED ORDER — FLUOXETINE HCL 20 MG PO TABS: 20.0000 mg | ORAL_TABLET | Freq: Every day | ORAL | 1 refills | Status: DC

## 2020-05-02 MED ORDER — HYDROXYZINE HCL 25 MG PO TABS: 25.0000 mg | ORAL_TABLET | Freq: Three times a day (TID) | ORAL | 1 refills | Status: DC | PRN

## 2020-05-02 NOTE — Progress Notes (Signed)
Patient: [name] MRN: [id number] DOB: 09/19/1951 PCP: Orma Flaming, MD     Subjective:  Chief Complaint  Patient presents with  . Anxiety  . Depression    HPI: The patient is a 69 y.o. female who presents today for Anxiety and depression. She says that she doesn't feel depressed, she's more jumpy than anything because of what's going on with her daughter. She states her daughter is having issues and finally admitted that she is very depressed. 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 She went to her house and she has been hoarding. [name] is so upset because she can't fix her daughter and it's very upsetting to her. She feels anxious and sad. She cries all of the time. She feels like she is high and then low. She is exercising. She is not currently in counseling. No si/hi/ah/vh.   Review of Systems  Respiratory: Negative for shortness of breath and wheezing.   Cardiovascular: Negative for chest pain and palpitations.  Neurological: Negative for dizziness, light-headedness and headaches.  Psychiatric/Behavioral: The patient is nervous/anxious.     Allergies Patient is allergic to sulfonamide derivatives and cefdinir.  Past Medical History Patient  has a past medical history of Acute meniscal tear of left knee, Asthma, Baker's cyst of knee, GAD (generalized anxiety disorder) (03/12/2015), GERD (gastroesophageal reflux disease), H/O cold sores (03/12/2015), Seasonal allergies, and Spider veins (11/23/2013).  Surgical History Patient  has a past surgical history that includes Breast biopsy; Breast lumpectomy (8/99); Colonoscopy (2005); Toe Surgery; and Dental surgery.  Family History Pateint's family history includes Cancer in her mother; Diabetes in her maternal grandmother, mother, and sister; Heart disease in her mother.  Social History Patient  reports that she has never smoked. She has never used smokeless tobacco. She reports current alcohol use of about 8.0  standard drinks of alcohol per week. She reports that she does not use drugs.    Objective: Vitals:   05/02/20 1423  BP: 130/70  Pulse: 62  Temp: (!) 97.2 F (36.2 C)  TempSrc: Temporal  SpO2: 97%  Weight: 160 lb 3.2 oz (72.7 kg)  Height: 5' 4.75" (1.645 m)    Body mass index is 26.86 kg/m.  Physical Exam Vitals reviewed.  Constitutional:      Appearance: Normal appearance. She is normal weight.  HENT:     Head: Normocephalic and atraumatic.  Eyes:     Extraocular Movements: Extraocular movements intact.     Pupils: Pupils are equal, round, and reactive to light.  Pulmonary:     Effort: Pulmonary effort is normal.  Neurological:     General: No focal deficit present.     Mental Status: She is alert and oriented to person, place, and time.  Psychiatric:        Mood and Affect: Mood normal.        Behavior: Behavior normal.        Thought Content: Thought content normal.        Judgment: Judgment normal.     Comments: Tearful at times.         GAD 7 : Generalized Anxiety Score 05/02/2020  Nervous, Anxious, on Edge 3  Control/stop worrying 3  Worry too much - different things 3  Trouble relaxing 2  Restless 3  Easily annoyed or irritable 2  Afraid - awful might happen 3  Total GAD 7 Score 19  Anxiety Difficulty Extremely difficult        Office Visit from 05/02/2020  in [place]  PHQ-9 Total Score 16       Assessment/plan: 1. Anxiety and depression Scores on ger GAD7 and phq9 are severe to moderately severe respectively and affecting her life. She is wanting to start medication and I think this is appropriate. Doesn't really want counseling as has a good support with her church, husband and small group. She exercises daily. I've explained to her that drugs of the SSRI class can have side effects such as weight gain, sexual dysfunction, insomnia, headache, nausea. These medications are generally effective at alleviating symptoms of  anxiety and/or depression. Let me know if significant side effects do occur. Any si/hi she is to call 911 or go to ER. See her back in one month or sooner if needed.      This visit occurred during the SARS-CoV-2 public health emergency.  Safety protocols were in place, including screening questions prior to the visit, additional usage of staff PPE, and extensive cleaning of exam room while observing appropriate contact time as indicated for disinfecting solutions.     Return in about 1 month (around 06/02/2020) for anxiety/depression .   Orma Flaming, MD [place]   05/02/2020

## 2020-05-02 NOTE — Patient Instructions (Addendum)
-  going to start you on a medication called prozac. You will take this daily. Will take 3-4 weeks to really get into your system so don't expect a miracle overnight. Can sometimes make anxiety little worse, but will get better. i'll see you back shortly in a month for follow up.   -can also take hydroxyzine as needed for anxiety or for sleep.   Hang in there, praying for you,  Dr. Rogers Blocker

## 2020-05-23 ENCOUNTER — Encounter: Payer: Self-pay | Admitting: Family Medicine

## 2020-05-23 ENCOUNTER — Telehealth (INDEPENDENT_AMBULATORY_CARE_PROVIDER_SITE_OTHER): Payer: PPO | Admitting: Family Medicine

## 2020-05-23 VITALS — Ht 64.75 in | Wt 160.0 lb

## 2020-05-23 DIAGNOSIS — J069 Acute upper respiratory infection, unspecified: Secondary | ICD-10-CM

## 2020-05-23 MED ORDER — AZITHROMYCIN 250 MG PO TABS: ORAL_TABLET | ORAL | 0 refills | Status: DC

## 2020-05-23 NOTE — Progress Notes (Signed)
Patient: [name] MRN: [id number] DOB: 1951/03/04 PCP: Orma Flaming, MD     I connected with [name] on 05/23/20 at 2:27pm by a video enabled telemedicine application and verified that I am speaking with the correct person using two identifiers.  Location patient: Home Location provider: [facility], Office Persons participating in this virtual visit: [name] and Dr. Rogers Blocker   I discussed the limitations of evaluation and management by telemedicine and the availability of in person appointments. The patient expressed understanding and agreed to proceed.   Interactive audio and video telecommunications were attempted between this provider and patient, however failed, due to patient having technical difficulties OR patient did not have access to video capability.  We continued and completed visit with audio only.    Subjective:  Chief Complaint  Patient presents with  . Sore Throat    Starting Sunday, it has gotten worse.  . Cough    HPI: The patient is a 69 y.o. female who presents today for Sore throat and cough. She says that this started on Sunday. She has burning in her throat and drainage and feels like it is now in her lungs. She went to [facility] and had a negative covid test. She is coughing up dark green mucous and can hear her lung "cracking" when she breaths in. She is not short of breath and denies any wheezing. No congestion or sinus pain or pressure. No fever/chills. She does have a nasal spray and has said mucinex helped. Denies any sick contacts. Did go out of town last weekend and doesn't know if she got this from there.   Review of Systems  Constitutional: Negative for fatigue and fever.  HENT: Positive for sore throat. Negative for congestion, ear pain, sinus pressure, sinus pain and trouble swallowing.   Respiratory: Positive for cough. Negative for chest tightness, shortness of breath and wheezing.   Cardiovascular: Negative for chest pain and  palpitations.  Gastrointestinal: Negative for abdominal pain, diarrhea, nausea and vomiting.    Allergies Patient is allergic to sulfonamide derivatives and cefdinir.  Past Medical History Patient  has a past medical history of Acute meniscal tear of left knee, Asthma, Baker's cyst of knee, GAD (generalized anxiety disorder) (03/12/2015), GERD (gastroesophageal reflux disease), H/O cold sores (03/12/2015), Seasonal allergies, and Spider veins (11/23/2013).  Surgical History Patient  has a past surgical history that includes Breast biopsy; Breast lumpectomy (8/99); Colonoscopy (2005); Toe Surgery; and Dental surgery.  Family History Pateint's family history includes Cancer in her mother; Diabetes in her maternal grandmother, mother, and sister; Heart disease in her mother.  Social History Patient  reports that she has never smoked. She has never used smokeless tobacco. She reports current alcohol use of about 8.0 standard drinks of alcohol per week. She reports that she does not use drugs.    Objective: Vitals:   05/23/20 1418  Weight: 160 lb (72.6 kg)  Height: 5' 4.75" (1.645 m)    Body mass index is 26.83 kg/m.  Physical Exam     Assessment/plan: 1. URI, acute Possibly viral and discussed this with her, but she is nervous as she feels like it is getting worse. Will continue with mucinex and recommended flonsae, cool mist humidifier and honey. Also will send in zpack to start if not getting better. She will let me know how she is doing and will stay home. precautions given.      Return if symptoms worsen or fail to improve.     Orma Flaming,  MD [place]  05/23/2020

## 2020-05-24 ENCOUNTER — Other Ambulatory Visit: Payer: Self-pay | Admitting: Family Medicine

## 2020-05-24 ENCOUNTER — Encounter: Payer: Self-pay | Admitting: Family Medicine

## 2020-06-04 ENCOUNTER — Encounter: Payer: Self-pay | Admitting: Family Medicine

## 2020-06-04 ENCOUNTER — Other Ambulatory Visit: Payer: Self-pay

## 2020-06-04 ENCOUNTER — Ambulatory Visit (INDEPENDENT_AMBULATORY_CARE_PROVIDER_SITE_OTHER): Payer: PPO | Admitting: Family Medicine

## 2020-06-04 VITALS — BP 152/84 | HR 73 | Temp 97.6°F | Ht 64.75 in | Wt 158.8 lb

## 2020-06-04 DIAGNOSIS — F419 Anxiety disorder, unspecified: Secondary | ICD-10-CM

## 2020-06-04 DIAGNOSIS — F329 Major depressive disorder, single episode, unspecified: Secondary | ICD-10-CM | POA: Diagnosis not present

## 2020-06-04 MED ORDER — HYDROXYZINE HCL 25 MG PO TABS: ORAL_TABLET | ORAL | 0 refills | Status: DC

## 2020-06-04 NOTE — Progress Notes (Signed)
Patient: [name] MRN: [id number] DOB: [birth year] PCP: Orma Flaming, MD     Subjective:  Chief Complaint  Patient presents with  . Anxiety  . Depression    HPI: The patient is a 69 y.o. female who presents today for Anxiety/Depression. Pt says she did not take medications as prescribed. She states the prozac made her tired and she didn't like how it made her feel. She could not tolerate it. Only took it a few times. Anxiety is improved and really only has from time to time.   Review of Systems  Respiratory: Negative for shortness of breath and wheezing.   Cardiovascular: Negative for chest pain and palpitations.  Neurological: Negative for dizziness and light-headedness.    Allergies Patient is allergic to crestor [rosuvastatin], sulfonamide derivatives, and cefdinir.  Past Medical History Patient  has a past medical history of Acute meniscal tear of left knee, Asthma, Baker's cyst of knee, GAD (generalized anxiety disorder) (03/12/2015), GERD (gastroesophageal reflux disease), H/O cold sores (03/12/2015), Seasonal allergies, and Spider veins (11/23/2013).  Surgical History Patient  has a past surgical history that includes Breast biopsy; Breast lumpectomy (8/99); Colonoscopy (2005); Toe Surgery; and Dental surgery.  Family History Pateint's family history includes Cancer in her mother; Diabetes in her maternal grandmother, mother, and sister; Heart disease in her mother.  Social History Patient  reports that she has never smoked. She has never used smokeless tobacco. She reports current alcohol use of about 8.0 standard drinks of alcohol per week. She reports that she does not use drugs.    Objective: Vitals:   06/04/20 1419  BP: (!) 152/84  Pulse: 73  Temp: 97.6 F (36.4 C)  TempSrc: Temporal  SpO2: 97%  Weight: 158 lb 12.8 oz (72 kg)  Height: 5' 4.75" (1.645 m)    Body mass index is 26.63 kg/m.   Physical Exam Vitals reviewed.  Constitutional:       Appearance: Normal appearance. She is normal weight.  HENT:     Head: Normocephalic and atraumatic.     Right Ear: Tympanic membrane, ear canal and external ear normal.     Left Ear: Tympanic membrane, ear canal and external ear normal.     Mouth/Throat:     Mouth: Mucous membranes are moist.  Eyes:     Extraocular Movements: Extraocular movements intact.     Conjunctiva/sclera: Conjunctivae normal.     Pupils: Pupils are equal, round, and reactive to light.  Cardiovascular:     Rate and Rhythm: Normal rate and regular rhythm.     Heart sounds: Normal heart sounds.  Pulmonary:     Effort: Pulmonary effort is normal. No respiratory distress.     Breath sounds: Normal breath sounds. No wheezing or rales.  Abdominal:     General: Abdomen is flat. Bowel sounds are normal.     Palpations: Abdomen is soft.  Skin:    General: Skin is warm.     Capillary Refill: Capillary refill takes less than 2 seconds.  Neurological:     General: No focal deficit present.     Mental Status: She is alert and oriented to person, place, and time.  Psychiatric:        Mood and Affect: Mood normal.        Behavior: Behavior normal.      GAD 7 : Generalized Anxiety Score 06/04/2020 05/02/2020  Nervous, Anxious, on Edge 0 3  Control/stop worrying 0 3  Worry too much - different things 0 3  Trouble relaxing 0 2  Restless 0 3  Easily annoyed or irritable 0 2  Afraid - awful might happen 0 3  Total GAD 7 Score 0 19  Anxiety Difficulty Not difficult at all Extremely difficult        Assessment/plan: 1. Anxiety and depression gad7 score drastically improved off medication. Discussed an as needed medication may be best at this point for her. Will do trial of low dose hydroxyzine prn, but if she finds that she is experiencing anxiety on a more day to day basis she needs to let me know so we can try another daily medication.   Would like covid antibody testing, recommended she call insurance as I do not  know if they will pay for this.       This visit occurred during the SARS-CoV-2 public health emergency.  Safety protocols were in place, including screening questions prior to the visit, additional usage of staff PPE, and extensive cleaning of exam room while observing appropriate contact time as indicated for disinfecting solutions.     Return if symptoms worsen or fail to improve.   Orma Flaming, MD [place]   06/04/2020

## 2020-06-04 NOTE — Patient Instructions (Signed)
For anxiety-  Will send in an AS NEEDED medication for you to use only when you feel anxious. It's not a daily medicine.  Hydroxyzine. Start with 1/2 tab as needed up to three times a day. May make you drowsy.  If you find that anxiety is getting worse, we will need to add on a daily medication.    Call insurance to see how much covid Ab test is and then let me know.   Dr. Rogers Blocker

## 2020-06-05 ENCOUNTER — Other Ambulatory Visit: Payer: Self-pay

## 2020-06-05 ENCOUNTER — Telehealth: Payer: Self-pay

## 2020-06-05 DIAGNOSIS — R059 Cough, unspecified: Secondary | ICD-10-CM

## 2020-06-05 DIAGNOSIS — R05 Cough: Secondary | ICD-10-CM

## 2020-06-05 NOTE — Telephone Encounter (Signed)
Pt.'s insurance will cover an antibody test, so she would like an order put in for that please.

## 2020-06-06 ENCOUNTER — Other Ambulatory Visit: Payer: PPO

## 2020-06-06 ENCOUNTER — Other Ambulatory Visit: Payer: Self-pay

## 2020-06-06 DIAGNOSIS — R05 Cough: Secondary | ICD-10-CM | POA: Diagnosis not present

## 2020-06-06 DIAGNOSIS — R059 Cough, unspecified: Secondary | ICD-10-CM

## 2020-06-06 NOTE — Telephone Encounter (Signed)
LVM for PT to schedule lab appt

## 2020-06-06 NOTE — Telephone Encounter (Signed)
Pt scheduled  

## 2020-06-06 NOTE — Telephone Encounter (Signed)
Order placed. Let her know she just needs to make lab appointment.  Orma Flaming, MD [place]

## 2020-06-07 LAB — SARS COV-2 SEROLOGY(COVID-19)AB(IGG,IGM),IMMUNOASSAY
SARS CoV-2 AB IgG: NEGATIVE
SARS CoV-2 IgM: NEGATIVE

## 2020-06-09 ENCOUNTER — Other Ambulatory Visit: Payer: Self-pay | Admitting: Family Medicine

## 2020-06-12 ENCOUNTER — Other Ambulatory Visit: Payer: Self-pay | Admitting: Family Medicine

## 2020-07-19 ENCOUNTER — Ambulatory Visit: Payer: PPO

## 2020-07-25 ENCOUNTER — Other Ambulatory Visit: Payer: Self-pay

## 2020-07-25 ENCOUNTER — Encounter: Payer: Self-pay | Admitting: Family Medicine

## 2020-07-25 ENCOUNTER — Ambulatory Visit (INDEPENDENT_AMBULATORY_CARE_PROVIDER_SITE_OTHER): Payer: PPO

## 2020-07-25 DIAGNOSIS — Z23 Encounter for immunization: Secondary | ICD-10-CM | POA: Diagnosis not present

## 2020-08-15 ENCOUNTER — Telehealth: Payer: Self-pay | Admitting: Family Medicine

## 2020-08-15 NOTE — Telephone Encounter (Signed)
Left message for patient to call back and schedule Medicare Annual Wellness Visit (AWV) either virtually/audio only OR in office. Whatever the patients preference is.  Last AWV 07/25/2019; please schedule at anytime with [facility]Nurse Health Advisor at [facility].  This should be a 45 minute visit.

## 2020-08-24 ENCOUNTER — Ambulatory Visit: Payer: PPO

## 2020-10-24 DIAGNOSIS — D225 Melanocytic nevi of trunk: Secondary | ICD-10-CM | POA: Diagnosis not present

## 2020-10-24 DIAGNOSIS — L578 Other skin changes due to chronic exposure to nonionizing radiation: Secondary | ICD-10-CM | POA: Diagnosis not present

## 2020-10-24 DIAGNOSIS — L089 Local infection of the skin and subcutaneous tissue, unspecified: Secondary | ICD-10-CM | POA: Diagnosis not present

## 2020-10-24 DIAGNOSIS — L821 Other seborrheic keratosis: Secondary | ICD-10-CM | POA: Diagnosis not present

## 2020-10-24 DIAGNOSIS — D485 Neoplasm of uncertain behavior of skin: Secondary | ICD-10-CM | POA: Diagnosis not present

## 2020-10-24 DIAGNOSIS — D1720 Benign lipomatous neoplasm of skin and subcutaneous tissue of unspecified limb: Secondary | ICD-10-CM | POA: Diagnosis not present

## 2020-10-24 DIAGNOSIS — L814 Other melanin hyperpigmentation: Secondary | ICD-10-CM | POA: Diagnosis not present

## 2020-11-05 ENCOUNTER — Ambulatory Visit: Payer: PPO

## 2020-11-05 ENCOUNTER — Ambulatory Visit: Payer: PPO | Admitting: Certified Nurse Midwife

## 2020-11-24 ENCOUNTER — Other Ambulatory Visit: Payer: Self-pay | Admitting: Family Medicine

## 2020-11-24 ENCOUNTER — Encounter: Payer: Self-pay | Admitting: Family Medicine

## 2020-11-26 ENCOUNTER — Other Ambulatory Visit: Payer: Self-pay

## 2020-11-26 MED ORDER — ROSUVASTATIN CALCIUM 5 MG PO TABS: 5.0000 mg | ORAL_TABLET | Freq: Every day | ORAL | 0 refills | Status: DC

## 2020-12-04 ENCOUNTER — Other Ambulatory Visit: Payer: Self-pay | Admitting: Family Medicine

## 2021-01-17 ENCOUNTER — Ambulatory Visit: Payer: PPO

## 2021-01-21 ENCOUNTER — Ambulatory Visit (INDEPENDENT_AMBULATORY_CARE_PROVIDER_SITE_OTHER): Payer: PPO

## 2021-01-21 DIAGNOSIS — Z0000 Encounter for general adult medical examination without abnormal findings: Secondary | ICD-10-CM

## 2021-01-21 NOTE — Patient Instructions (Signed)
[name] , Thank you for taking time to come for your Medicare Wellness Visit. I appreciate your ongoing commitment to your health goals. Please review the following plan we discussed and let me know if I can assist you in the future.   Screening recommendations/referrals: Colonoscopy: Done 08/04/14 Mammogram: Done 04/08/19 Bone Density: Done 04/08/19 Recommended yearly ophthalmology/optometry visit for glaucoma screening and checkup Recommended yearly dental visit for hygiene and checkup  Vaccinations: Influenza vaccine: Up to date Pneumococcal vaccine: Up to date Tdap vaccine: Up to date Shingles vaccine: Completed 08/04/19 & 11/16/19   Covid-19:Completed 2/12, 3/6, 08/15/20  Advanced directives: Advance directive discussed with you today. I have provided a copy for you to complete at home and have notarized. Once this is complete please bring a copy in to our office so we can scan it into your chart.  Conditions/risks identified: Lose weight   Next appointment: Follow up in one year for your annual wellness visit    Preventive Care 65 Years and Older, Female Preventive care refers to lifestyle choices and visits with your health care provider that can promote health and wellness. What does preventive care include?  A yearly physical exam. This is also called an annual well check.  Dental exams once or twice a year.  Routine eye exams. Ask your health care provider how often you should have your eyes checked.  Personal lifestyle choices, including:  Daily care of your teeth and gums.  Regular physical activity.  Eating a healthy diet.  Avoiding tobacco and drug use.  Limiting alcohol use.  Practicing safe sex.  Taking low-dose aspirin every day.  Taking vitamin and mineral supplements as recommended by your health care provider. What happens during an annual well check? The services and screenings done by your health care provider during your annual well check will  depend on your age, overall health, lifestyle risk factors, and family history of disease. Counseling  Your health care provider may ask you questions about your:  Alcohol use.  Tobacco use.  Drug use.  Emotional well-being.  Home and relationship well-being.  Sexual activity.  Eating habits.  History of falls.  Memory and ability to understand (cognition).  Work and work Statistician.  Reproductive health. Screening  You may have the following tests or measurements:  Height, weight, and BMI.  Blood pressure.  Lipid and cholesterol levels. These may be checked every 5 years, or more frequently if you are over 29 years old.  Skin check.  Lung cancer screening. You may have this screening every year starting at age 39 if you have a 30-pack-year history of smoking and currently smoke or have quit within the past 15 years.  Fecal occult blood test (FOBT) of the stool. You may have this test every year starting at age 52.  Flexible sigmoidoscopy or colonoscopy. You may have a sigmoidoscopy every 5 years or a colonoscopy every 10 years starting at age 71.  Hepatitis C blood test.  Hepatitis B blood test.  Sexually transmitted disease (STD) testing.  Diabetes screening. This is done by checking your blood sugar (glucose) after you have not eaten for a while (fasting). You may have this done every 1-3 years.  Bone density scan. This is done to screen for osteoporosis. You may have this done starting at age 5.  Mammogram. This may be done every 1-2 years. Talk to your health care provider about how often you should have regular mammograms. Talk with your health care provider about your  test results, treatment options, and if necessary, the need for more tests. Vaccines  Your health care provider may recommend certain vaccines, such as:  Influenza vaccine. This is recommended every year.  Tetanus, diphtheria, and acellular pertussis (Tdap, Td) vaccine. You may need a  Td booster every 10 years.  Zoster vaccine. You may need this after age 18.  Pneumococcal 13-valent conjugate (PCV13) vaccine. One dose is recommended after age 43.  Pneumococcal polysaccharide (PPSV23) vaccine. One dose is recommended after age 76. Talk to your health care provider about which screenings and vaccines you need and how often you need them. This information is not intended to replace advice given to you by your health care provider. Make sure you discuss any questions you have with your health care provider. Document Released: 11/02/2015 Document Revised: 06/25/2016 Document Reviewed: 08/07/2015 Elsevier Interactive Patient Education  2017 [place] Prevention in the Home Falls can cause injuries. They can happen to people of all ages. There are many things you can do to make your home safe and to help prevent falls. What can I do on the outside of my home?  Regularly fix the edges of walkways and driveways and fix any cracks.  Remove anything that might make you trip as you walk through a door, such as a raised step or threshold.  Trim any bushes or trees on the path to your home.  Use bright outdoor lighting.  Clear any walking paths of anything that might make someone trip, such as rocks or tools.  Regularly check to see if handrails are loose or broken. Make sure that both sides of any steps have handrails.  Any raised decks and porches should have guardrails on the edges.  Have any leaves, snow, or ice cleared regularly.  Use sand or salt on walking paths during winter.  Clean up any spills in your garage right away. This includes oil or grease spills. What can I do in the bathroom?  Use night lights.  Install grab bars by the toilet and in the tub and shower. Do not use towel bars as grab bars.  Use non-skid mats or decals in the tub or shower.  If you need to sit down in the shower, use a plastic, non-slip stool.  Keep the floor dry. Clean  up any water that spills on the floor as soon as it happens.  Remove soap buildup in the tub or shower regularly.  Attach bath mats securely with double-sided non-slip rug tape.  Do not have throw rugs and other things on the floor that can make you trip. What can I do in the bedroom?  Use night lights.  Make sure that you have a light by your bed that is easy to reach.  Do not use any sheets or blankets that are too big for your bed. They should not hang down onto the floor.  Have a firm chair that has side arms. You can use this for support while you get dressed.  Do not have throw rugs and other things on the floor that can make you trip. What can I do in the kitchen?  Clean up any spills right away.  Avoid walking on wet floors.  Keep items that you use a lot in easy-to-reach places.  If you need to reach something above you, use a strong step stool that has a grab bar.  Keep electrical cords out of the way.  Do not use floor polish or wax that  makes floors slippery. If you must use wax, use non-skid floor wax.  Do not have throw rugs and other things on the floor that can make you trip. What can I do with my stairs?  Do not leave any items on the stairs.  Make sure that there are handrails on both sides of the stairs and use them. Fix handrails that are broken or loose. Make sure that handrails are as long as the stairways.  Check any carpeting to make sure that it is firmly attached to the stairs. Fix any carpet that is loose or worn.  Avoid having throw rugs at the top or bottom of the stairs. If you do have throw rugs, attach them to the floor with carpet tape.  Make sure that you have a light switch at the top of the stairs and the bottom of the stairs. If you do not have them, ask someone to add them for you. What else can I do to help prevent falls?  Wear shoes that:  Do not have high heels.  Have rubber bottoms.  Are comfortable and fit you well.  Are  closed at the toe. Do not wear sandals.  If you use a stepladder:  Make sure that it is fully opened. Do not climb a closed stepladder.  Make sure that both sides of the stepladder are locked into place.  Ask someone to hold it for you, if possible.  Clearly mark and make sure that you can see:  Any grab bars or handrails.  First and last steps.  Where the edge of each step is.  Use tools that help you move around (mobility aids) if they are needed. These include:  Canes.  Walkers.  Scooters.  Crutches.  Turn on the lights when you go into a dark area. Replace any light bulbs as soon as they burn out.  Set up your furniture so you have a clear path. Avoid moving your furniture around.  If any of your floors are uneven, fix them.  If there are any pets around you, be aware of where they are.  Review your medicines with your doctor. Some medicines can make you feel dizzy. This can increase your chance of falling. Ask your doctor what other things that you can do to help prevent falls. This information is not intended to replace advice given to you by your health care provider. Make sure you discuss any questions you have with your health care provider. Document Released: 08/02/2009 Document Revised: 03/13/2016 Document Reviewed: 11/10/2014 Elsevier Interactive Patient Education  2017 [employer].

## 2021-01-21 NOTE — Progress Notes (Addendum)
Virtual Visit via Telephone Note  I connected with  [name] on 01/21/21 at  1:00 PM EDT by telephone and verified that I am speaking with the correct person using two identifiers.  Medicare Annual Wellness visit completed telephonically due to Covid-19 pandemic.   Persons participating in this call: This Health Coach and this patient.   Location: Patient: Home Provider: Office    I discussed the limitations, risks, security and privacy concerns of performing an evaluation and management service by telephone and the availability of in person appointments. The patient expressed understanding and agreed to proceed.  Unable to perform video visit due to video visit attempted and failed and/or patient does not have video capability.   Some vital signs may be absent or patient reported.   Willette Brace, LPN    Subjective:   Soraya Paquette is a 70 y.o. female who presents for Medicare Annual (Subsequent) preventive examination.  Review of Systems     Cardiac Risk Factors include: advanced age (>51men, >68 women);hypertension;dyslipidemia     Objective:    There were no vitals filed for this visit. There is no height or weight on file to calculate BMI.  Advanced Directives 01/21/2021 07/25/2019 09/17/2015 08/04/2014 07/14/2014  Does Patient Have a Medical Advance Directive? Yes Yes Yes No Yes  Type of Advance Directive Living will Living will;Healthcare Power of [place]  Does patient want to make changes to medical advance directive? - No - Patient declined - - -  Copy of [place] in Chart? - No - copy requested No - copy requested - -    Current Medications (verified) Outpatient Encounter Medications as of 01/21/2021  Medication Sig  . calcium-vitamin D (OSCAL WITH D) 250-125 MG-UNIT per tablet Take 1 tablet by mouth as needed.   . Cyanocobalamin (VITAMIN B 12 PO) Take by mouth daily.   Marland Kitchen  lisinopril (ZESTRIL) 20 MG tablet TAKE 1 TABLET BY MOUTH EVERY DAY  . Multiple Vitamins-Minerals (MULTIVITAMIN PO) Take by mouth.  . naproxen sodium (ALEVE) 220 MG tablet Take 220 mg by mouth as needed.  . Probiotic Product (PROBIOTIC PO) Take by mouth as needed.  . rosuvastatin (CRESTOR) 5 MG tablet Take 1 tablet (5 mg total) by mouth daily. (Patient taking differently: Take 5 mg by mouth daily. Pt taking every other day)  . hydrOXYzine (ATARAX/VISTARIL) 25 MG tablet 1/2-1 tablet up to three times a day as needed for anxiety (Patient not taking: Reported on 01/21/2021)  . valACYclovir (VALTREX) 500 MG tablet TAKE 1 TABLET BY MOUTH TWICE A DAY AS NEEDED (Patient not taking: Reported on 01/21/2021)   No facility-administered encounter medications on file as of 01/21/2021.    Allergies (verified) Crestor [rosuvastatin], Sulfonamide derivatives, and Cefdinir   History: Past Medical History:  Diagnosis Date  . Acute meniscal tear of left knee    treated by Dr Katha Hamming date pending  . Asthma    as a child  . Baker's cyst of knee    left knee-treated by Dr French Ana  . GAD (generalized anxiety disorder) 03/12/2015   -with rare panic disorder related to caring for grandaughter   . GERD (gastroesophageal reflux disease)   . 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 Seasonal allergies   . Spider veins 11/23/2013   Past Surgical History:  Procedure Laterality Date  . BREAST BIOPSY     left  . BREAST LUMPECTOMY  8/99   left breast- benign (  lipoma)  . COLONOSCOPY  2005   Dr Lyla Son  . DENTAL SURGERY     Cyst on right bottom tooth  . TOE SURGERY     Family History  Problem Relation Age of Onset  . Heart disease Mother   . Diabetes Mother   . Cancer Mother        hodgekins lymphoma  . Diabetes Sister   . Diabetes Maternal Grandmother   . Colon cancer Neg Hx   . Stomach cancer Neg Hx    Social History   Socioeconomic History  . Marital status: Married    Spouse name: Not on file  .  Number of children: Not on file  . Years of education: Not on file  . Highest education level: Not on file  Occupational History  . Not on file  Tobacco Use  . Smoking status: Never Smoker  . Smokeless tobacco: Never Used  Vaping Use  . Vaping Use: Never used  Substance and Sexual Activity  . Alcohol use: Yes    Alcohol/week: 8.0 standard drinks    Types: 8 Glasses of wine per week  . Drug use: No  . Sexual activity: Yes    Partners: Male    Comment: husband vasectomy  Other Topics Concern  . 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 Difficulty of Paying Living Expenses: Not hard at all  Food Insecurity: No Food Insecurity  . Worried About Charity fundraiser in the Last Year: Never true  . Ran Out of Food in the Last Year: Never true  Transportation Needs: No Transportation Needs  . Lack of Transportation (Medical): No  . Lack of Transportation (Non-Medical): No  Physical Activity: Sufficiently Active  . Days of Exercise per Week: 2 days  . Minutes of Exercise per Session: 120 min  Stress: No Stress Concern Present  . Feeling of Stress : Only a little  Social Connections: Socially Integrated  . Frequency of Communication with Friends and Family: More than three times a week  . Frequency of Social Gatherings with Friends and Family: More than three times a week  . Attends Religious Services: More than 4 times per year  . Active Member of Clubs or Organizations: Yes  . Attends Archivist Meetings: 1 to 4 times per year  . Marital Status: Married    Tobacco Counseling Counseling given: Not Answered   Clinical Intake:  Pre-visit preparation completed: Yes  Pain  : No/denies pain     BMI - recorded: 26.63 Nutritional Status: BMI 25 -29 Overweight Nutritional Risks: None Diabetes: No  How often do you need to have someone help you when you read instructions, pamphlets, or other written materials from your doctor or pharmacy?: 1 - Never  Diabetic?No  Interpreter Needed?: No  Information entered by :: Charlott Rakes, LPN   Activities of Daily Living In your present state of health, do you have any difficulty performing the following activities: 01/21/2021  Hearing? N  Vision? N  Difficulty concentrating or making decisions? N  Walking or climbing stairs? N  Dressing or bathing? N  Doing errands,  shopping? N  Preparing Food and eating ? N  Using the Toilet? N  In the past six months, have you accidently leaked urine? Y  Comment when sneezing  Do you have problems with loss of bowel control? N  Managing your Medications? N  Managing your Finances? N  Housekeeping or managing your Housekeeping? N  Some recent data might be hidden    Patient Care Team: Orma Flaming, MD as PCP - General (Family Medicine) Regina Eck, CNM as Consulting Physician (Certified Nurse Midwife) Katy Apo, MD as Consulting Physician (Ophthalmology) Renda Rolls, Jennefer Bravo, MD as Consulting Physician (Dermatology) Earlie Server, MD as Consulting Physician (Orthopedic Surgery)  Indicate any recent Joyce you may have received from other than [facility] providers in the past year (date may be approximate).     Assessment:   This is a routine wellness examination for Adilene.  Hearing/Vision screen  Hearing Screening   125Hz  250Hz  500Hz  1000Hz  2000Hz  3000Hz  4000Hz  6000Hz  8000Hz   Right ear:           Left ear:           Comments: Pt denies any hearing issues   Vision Screening Comments: Pt follows up with Dr Prudencio Burly for annual eye exams   Dietary issues and exercise activities discussed: Current Exercise Habits: Home exercise  routine;Structured exercise class, Type of exercise: walking (golfing), Time (Minutes): > 60, Frequency (Times/Week): 2, Weekly Exercise (Minutes/Week): 0  Goals    . Patient Stated     Lose weight     . Reduce caffeine intake     Reduce chocolate at bedtime       Depression Screen PHQ 2/9 Scores 01/21/2021 06/04/2020 05/02/2020 07/25/2019 07/21/2018 06/08/2017 09/17/2015  PHQ - 2 Score 0 0 5 0 0 0 0  PHQ- 9 Score - 1 16 - - - -    Fall Risk Fall Risk  01/21/2021 06/04/2020 05/23/2020 05/02/2020 11/21/2019  Falls in the past year? 0 0 0 0 0  Number falls in past yr: 0 - - - -  Injury with Fall? 0 - - - -  Risk for fall due to : Impaired vision No Fall Risks No Fall Risks No Fall Risks -  Follow up Falls prevention discussed - - - -    FALL RISK PREVENTION PERTAINING TO THE HOME:  Any stairs in or around the home? Yes  If so, are there any without handrails? No  Home free of loose throw rugs in walkways, pet beds, electrical cords, etc? Yes  Adequate lighting in your home to reduce risk of falls? Yes   ASSISTIVE DEVICES UTILIZED TO PREVENT FALLS:  Life alert? No  Use of a cane, walker or w/c? No  Grab bars in the bathroom? No  Shower chair or bench in shower? No  Elevated toilet seat or a handicapped toilet? No   TIMED UP AND GO:  Was the test performed? No     Cognitive Function:     6CIT Screen 01/21/2021 07/25/2019  What Year? 0 points 0 points  What month? 0 points 0 points  What time? - 0 points  Count back from 20 0 points 0 points  Months in reverse 0 points 0 points  Repeat phrase 0 points 0 points  Total Score - 0    Immunizations Immunization History  Administered Date(s) Administered  . Fluad Quad(high Dose 65+) 07/25/2019, 07/25/2020  . Influenza Split 08/19/2011, 07/13/2012  . Influenza Whole 07/21/2008  . Influenza, High Dose  Seasonal PF 08/21/2015, 07/16/2016, 07/31/2017, 07/21/2018  . Influenza,inj,Quad PF,6+ Mos 07/12/2013, 07/27/2014  . PFIZER(Purple  Top)SARS-COV-2 Vaccination 12/02/2019, 12/24/2019, 08/15/2020  . Pneumococcal Conjugate-13 03/12/2015  . Pneumococcal Polysaccharide-23 07/16/2016  . Td 10/20/1996, 04/14/2008  . Tdap 10/21/2011  . Zoster 10/07/2011  . Zoster Recombinat (Shingrix) 08/04/2019, 11/16/2019    TDAP status: Up to date  Flu Vaccine status: Up to date  Pneumococcal vaccine status: Up to date  Covid-19 vaccine status: Completed vaccines  Qualifies for Shingles Vaccine? Yes   Zostavax completed Yes   Shingrix Completed?: Yes  Screening Tests Health Maintenance  Topic Date Due  . MAMMOGRAM  04/07/2021  . INFLUENZA VACCINE  05/20/2021  . TETANUS/TDAP  10/20/2021  . COLONOSCOPY (Pts 45-57yrs Insurance coverage will need to be confirmed)  08/04/2024  . DEXA SCAN  Completed  . COVID-19 Vaccine  Completed  . PNA vac Low Risk Adult  Completed  . Hepatitis C Screening  Addressed  . HPV VACCINES  Aged Out    Health Maintenance  There are no preventive care reminders to display for this patient.  Colorectal cancer screening: Type of screening: Colonoscopy. Completed 08/04/14. Repeat every 10 years  Mammogram status: Completed 04/08/19. Repeat every year  Bone Density status: Completed 04/08/19. Results reflect: Bone density results: OSTEOPENIA. Repeat every 2 years.  Additional Screening:  Hepatitis C Screening:  Completed 09/17/15  Vision Screening: Recommended annual ophthalmology exams for early detection of glaucoma and other disorders of the eye. Is the patient up to date with their annual eye exam?  Yes  Who is the provider or what is the name of the office in which the patient attends annual eye exams? Dr Prudencio Burly  If pt is not established with a provider, would they like to be referred to a provider to establish care? No .   Dental Screening: Recommended annual dental exams for proper oral hygiene  Community Resource Referral / Chronic Care Management: CRR required this visit?  No   CCM  required this visit?  No      Plan:     I have personally reviewed and noted the following in the patient's chart:   . Medical and social history . Use of alcohol, tobacco or illicit drugs  . Current medications and supplements . Functional ability and status . Nutritional status . Physical activity . Advanced directives . List of other physicians . Hospitalizations, surgeries, and ER visits in previous 12 months . Vitals . Screenings to include cognitive, depression, and falls . Referrals and appointments  In addition, I have reviewed and discussed with patient certain preventive protocols, quality metrics, and best practice recommendations. A written personalized care plan for preventive services as well as general preventive health recommendations were provided to patient.     Willette Brace, LPN   06/27/9891   Nurse Notes: None

## 2021-02-04 ENCOUNTER — Encounter: Payer: Self-pay | Admitting: Family Medicine

## 2021-02-04 ENCOUNTER — Ambulatory Visit (INDEPENDENT_AMBULATORY_CARE_PROVIDER_SITE_OTHER): Payer: PPO | Admitting: Family Medicine

## 2021-02-04 ENCOUNTER — Other Ambulatory Visit: Payer: Self-pay

## 2021-02-04 VITALS — BP 152/80 | HR 61 | Temp 97.4°F | Ht 64.75 in | Wt 159.6 lb

## 2021-02-04 DIAGNOSIS — I10 Essential (primary) hypertension: Secondary | ICD-10-CM | POA: Diagnosis not present

## 2021-02-04 DIAGNOSIS — F411 Generalized anxiety disorder: Secondary | ICD-10-CM

## 2021-02-04 DIAGNOSIS — E782 Mixed hyperlipidemia: Secondary | ICD-10-CM

## 2021-02-04 LAB — COMPREHENSIVE METABOLIC PANEL
ALT: 23 U/L (ref 0–35)
AST: 23 U/L (ref 0–37)
Albumin: 4 g/dL (ref 3.5–5.2)
Alkaline Phosphatase: 70 U/L (ref 39–117)
BUN: 19 mg/dL (ref 6–23)
CO2: 29 mEq/L (ref 19–32)
Calcium: 9.6 mg/dL (ref 8.4–10.5)
Chloride: 103 mEq/L (ref 96–112)
Creatinine, Ser: 0.66 mg/dL (ref 0.40–1.20)
GFR: 88.48 mL/min (ref >60.00)
Glucose, Bld: 85 mg/dL (ref 70–99)
Potassium: 4.3 mEq/L (ref 3.5–5.1)
Sodium: 138 mEq/L (ref 135–145)
Total Bilirubin: 0.4 mg/dL (ref 0.2–1.2)
Total Protein: 6.5 g/dL (ref 6.0–8.3)

## 2021-02-04 LAB — LIPID PANEL
Cholesterol: 167 mg/dL (ref 0–200)
HDL: 69.2 mg/dL (ref >39.00)
NonHDL: 97.44
Total CHOL/HDL Ratio: 2
Triglycerides: 201 mg/dL — ABNORMAL HIGH (ref 0.0–149.0)
VLDL: 40.2 mg/dL — ABNORMAL HIGH (ref 0.0–40.0)

## 2021-02-04 LAB — CBC WITH DIFFERENTIAL/PLATELET
Basophils Absolute: 0 10*3/uL (ref 0.0–0.1)
Basophils Relative: 0.6 % (ref 0.0–3.0)
Eosinophils Absolute: 0.1 10*3/uL (ref 0.0–0.7)
Eosinophils Relative: 2.5 % (ref 0.0–5.0)
HCT: 40.6 % (ref 36.0–46.0)
Hemoglobin: 13.4 g/dL (ref 12.0–15.0)
Lymphocytes Relative: 37.4 % (ref 12.0–46.0)
Lymphs Abs: 2.2 10*3/uL (ref 0.7–4.0)
MCHC: 33.1 g/dL (ref 30.0–36.0)
MCV: 95.5 fl (ref 78.0–100.0)
Monocytes Absolute: 0.8 10*3/uL (ref 0.1–1.0)
Monocytes Relative: 13.4 % — ABNORMAL HIGH (ref 3.0–12.0)
Neutro Abs: 2.7 10*3/uL (ref 1.4–7.7)
Neutrophils Relative %: 46.1 % (ref 43.0–77.0)
Platelets: 241 10*3/uL (ref 150.0–400.0)
RBC: 4.25 Mil/uL (ref 3.87–5.11)
RDW: 13.1 % (ref 11.5–15.5)
WBC: 5.8 10*3/uL (ref 4.0–10.5)

## 2021-02-04 LAB — LDL CHOLESTEROL, DIRECT: Direct LDL: 89 mg/dL

## 2021-02-04 LAB — MICROALBUMIN / CREATININE URINE RATIO
Creatinine,U: 27.4 mg/dL
Microalb Creat Ratio: 2.6 mg/g (ref 0.0–30.0)
Microalb, Ur: <0.7 mg/dL (ref 0.0–1.9)

## 2021-02-04 MED ORDER — FLUOXETINE HCL 20 MG PO CAPS: 20.0000 mg | ORAL_CAPSULE | Freq: Every day | ORAL | 3 refills | Status: DC

## 2021-02-04 MED ORDER — FLUOXETINE HCL 20 MG PO TABS: 20.0000 mg | ORAL_TABLET | Freq: Every day | ORAL | 3 refills | Status: DC

## 2021-02-04 NOTE — Progress Notes (Signed)
Patient: [name] MRN: [id number] DOB: [birth year] PCP: Orma Flaming, MD     Subjective:  Chief Complaint  Patient presents with  . Hyperlipidemia    labs  . Hypertension  . Anxiety    HPI: The patient is a 70 y.o. female who presents today for joint pain, and Cholesterol labs, HTN, hyperlipidemia and anxiety.   We started a statin back in the fall of 11/2019 (crestor 5mg ). She took this for a few months and then stopped due to joint pain. She recently started back up and is taking every other day. This has stopped the joint pain completely and no muscle cramping. She did eat some cereal this AM.   The 10-year ASCVD risk score Mikey Bussing DC Jr., et al., 2013) is: 19.4%  Hypertension: Here for follow up of hypertension.  Currently on lisinopril 20mg /day . Takes medication as prescribed and denies any side effects. Exercise includes golfing and walking. Weight has been stable. Denies any chest pain, headaches, shortness of breath, vision changes, swelling in lower extremities.   She also states her anxiety is through the roof. I saw her in 04/2020 and we started a SSRI, but she never started this and we had f/u in 05/2020 and was doing better off medication. She has had a terrible start to 2022 and thinks she needs to start something back. Her daughter is in rehab for alcoholism and her grandduaghter needs 24/7 care. The father has full guardianship of the granddaughter since the mother is in rehab. She has had to take care of the granddaughter which is too much for her as a 36 year old. She jumps every time the phone rings. She is exercising and playing golf and doing well on this end. She has good support as well. No panic attacks.   Review of Systems  Constitutional: Negative for chills, fatigue and fever.  HENT: Negative for congestion, dental problem, ear pain, hearing loss and trouble swallowing.   Eyes: Negative for visual disturbance.  Respiratory: Negative for cough, chest tightness  and shortness of breath.   Cardiovascular: Negative for chest pain, palpitations and leg swelling.  Gastrointestinal: Negative for abdominal pain, blood in stool, diarrhea and nausea.  Endocrine: Negative for cold intolerance, polydipsia, polyphagia and polyuria.  Genitourinary: Negative for dysuria and hematuria.  Musculoskeletal: Negative for arthralgias.  Skin: Negative for rash.  Neurological: Negative for dizziness and headaches.  Psychiatric/Behavioral: Negative for dysphoric mood and sleep disturbance. The patient is not nervous/anxious.     Allergies Patient is allergic to crestor [rosuvastatin], sulfonamide derivatives, and cefdinir.  Past Medical History Patient  has a past medical history of Acute meniscal tear of left knee, Asthma, Baker's cyst of knee, GAD (generalized anxiety disorder) (03/12/2015), GERD (gastroesophageal reflux disease), H/O cold sores (03/12/2015), Seasonal allergies, and Spider veins (11/23/2013).  Surgical History Patient  has a past surgical history that includes Breast biopsy; Breast lumpectomy (8/99); Colonoscopy (2005); Toe Surgery; and Dental surgery.  Family History Pateint's family history includes Cancer in her mother; Diabetes in her maternal grandmother, mother, and sister; Heart disease in her mother.  Social History Patient  reports that she has never smoked. She has never used smokeless tobacco. She reports current alcohol use of about 8.0 standard drinks of alcohol per week. She reports that she does not use drugs.    Objective: Vitals:   02/04/21 0923 02/04/21 1001  BP: (!) 160/70 (!) 152/80  Pulse: 61   Temp: (!) 97.4 F (36.3 C)   TempSrc: Temporal  SpO2: 98%   Weight: 159 lb 9.6 oz (72.4 kg)   Height: 5' 4.75" (1.645 m)     Body mass index is 26.76 kg/m.  Physical Exam Vitals reviewed.  Constitutional:      Appearance: Normal appearance. She is well-developed and normal weight.  HENT:     Head: Normocephalic and  atraumatic.     Right Ear: External ear normal.     Left Ear: External ear normal.  Eyes:     Extraocular Movements: Extraocular movements intact.     Conjunctiva/sclera: Conjunctivae normal.     Pupils: Pupils are equal, round, and reactive to light.  Neck:     Thyroid: No thyromegaly.     Vascular: No carotid bruit.  Cardiovascular:     Rate and Rhythm: Normal rate and regular rhythm.     Heart sounds: Normal heart sounds. No murmur heard.   Pulmonary:     Effort: Pulmonary effort is normal.     Breath sounds: Normal breath sounds.  Abdominal:     General: Abdomen is flat. Bowel sounds are normal. There is no distension.     Palpations: Abdomen is soft.     Tenderness: There is no abdominal tenderness.  Musculoskeletal:     Cervical back: Normal range of motion and neck supple.  Lymphadenopathy:     Cervical: No cervical adenopathy.  Skin:    General: Skin is warm and dry.     Capillary Refill: Capillary refill takes less than 2 seconds.     Findings: No rash.  Neurological:     General: No focal deficit present.     Mental Status: She is alert and oriented to person, place, and time.     Cranial Nerves: No cranial nerve deficit.     Coordination: Coordination normal.     Deep Tendon Reflexes: Reflexes normal.  Psychiatric:        Mood and Affect: Mood normal.        Behavior: Behavior normal.    GAD 7 : Generalized Anxiety Score 02/04/2021 06/04/2020 05/02/2020  Nervous, Anxious, on Edge 3 0 3  Control/stop worrying 3 0 3  Worry too much - different things 2 0 3  Trouble relaxing 1 0 2  Restless 1 0 3  Easily annoyed or irritable 3 0 2  Afraid - awful might happen 1 0 3  Total GAD 7 Score 14 0 19  Anxiety Difficulty - Not difficult at all Extremely difficult         Assessment/plan:  Essential hypertension Above goal, but she is very stressed. Typically well controlled. Will stay the course and have her check her readings. See her back in one month.   -  Plan: CBC with Differential/Platelet, Comprehensive metabolic panel, Microalbumin / creatinine urine ratio  Mixed hyperlipidemia - Taking every other day with no side effects. Will check cholesterol today, but I am fine with this.   Plan: Lipid panel  GAD (generalized anxiety disorder) GAd7 with moderate anxiety. She has a lot going on. Starting her on prozac like we discussed last year. Recommended she really stay the course and start this. Discussed side effects of SSRI drugs again. Close f/u in 1 month. Any issues let me know.      Return in about 1 month (around 03/06/2021) for anxiety f/u wiht  .   Orma Flaming, MD [place]   02/04/2021

## 2021-02-04 NOTE — Patient Instructions (Addendum)
1) rechecking your cholesterol labs since you are on every other day medication.   2) routine labs for blood pressure  3) starting you on anxiety medication. Same one we tried to do last time called prozac. Let me know if any issues. Takes 3-4 weeks to get into system and tell a difference. Will have close f/u in 1 month.  Get capsule, not tablet! Tends to way cheaper!

## 2021-02-21 ENCOUNTER — Other Ambulatory Visit: Payer: Self-pay | Admitting: Family Medicine

## 2021-03-06 ENCOUNTER — Ambulatory Visit: Payer: PPO | Admitting: Family Medicine

## 2021-03-12 ENCOUNTER — Encounter: Payer: Self-pay | Admitting: Family Medicine

## 2021-04-01 DIAGNOSIS — L820 Inflamed seborrheic keratosis: Secondary | ICD-10-CM | POA: Diagnosis not present

## 2021-05-10 ENCOUNTER — Telehealth: Payer: Self-pay | Admitting: Family Medicine

## 2021-05-10 NOTE — Chronic Care Management (AMB) (Signed)
  Chronic Care Management   Note  05/10/2021 Name: [name] MRN: [id number] DOB: Dec 28, 1950  [name] is a 70 y.o. year old female who is a primary care patient of Orma Flaming, MD. I reached out to [facility] by phone today in response to a referral sent by [name]'s PCP, Orma Flaming, MD.   [name] was given information about Chronic Care Management services today including:  CCM service includes personalized support from designated clinical staff supervised by her physician, including individualized plan of care and coordination with other care providers 24/7 contact phone numbers for assistance for urgent and routine care needs. Service will only be billed when office clinical staff spend 20 minutes or more in a month to coordinate care. Only one practitioner may furnish and bill the service in a calendar month. The patient may stop CCM services at any time (effective at the end of the month) by phone call to the office staff.   Patient agreed to services and verbal consent obtained.   Follow up plan:   [name] Upstream Scheduler

## 2021-05-15 DIAGNOSIS — D23122 Other benign neoplasm of skin of left lower eyelid, including canthus: Secondary | ICD-10-CM | POA: Diagnosis not present

## 2021-05-15 DIAGNOSIS — H524 Presbyopia: Secondary | ICD-10-CM | POA: Diagnosis not present

## 2021-05-15 DIAGNOSIS — H5203 Hypermetropia, bilateral: Secondary | ICD-10-CM | POA: Diagnosis not present

## 2021-05-15 DIAGNOSIS — H2513 Age-related nuclear cataract, bilateral: Secondary | ICD-10-CM | POA: Diagnosis not present

## 2021-05-30 ENCOUNTER — Other Ambulatory Visit: Payer: Self-pay | Admitting: Family Medicine

## 2021-05-30 ENCOUNTER — Other Ambulatory Visit: Payer: Self-pay

## 2021-06-14 ENCOUNTER — Telehealth: Payer: Self-pay | Admitting: Pharmacist

## 2021-06-14 NOTE — Chronic Care Management (AMB) (Signed)
    Chronic Care Management Pharmacy Assistant   Name: [name]  MRN: [id number] DOB: 1951/09/26  [name] is an 70 y.o. year old female who presents for his initial CCM visit with the clinical pharmacist.  Reason for Encounter: Chart Review For Initial Visit With Clinical Pharmacist   Conditions to be addressed/monitored: HTN, GERD, OA, GAD, HLD, Seasonal Allergies  Primary concerns for visit include: HTN, HLD  Recent office visits:  02/04/2021 [facility] (PCP) Orma Flaming, MD; start on prozac, follow up in 1 month  Recent consult visits:  04/01/2021 [facility] (dermatology) [place], Christina L; no further information available.  Hospital visits:  None in previous 6 months  Medications: Outpatient Encounter Medications as of 06/14/2021  Medication Sig   calcium-vitamin D (OSCAL WITH D) 250-125 MG-UNIT per tablet Take 1 tablet by mouth as needed.    Cyanocobalamin (VITAMIN B 12 PO) Take by mouth daily.    FLUoxetine (PROZAC) 20 MG capsule Take 1 capsule (20 mg total) by mouth daily.   hydrOXYzine (ATARAX/VISTARIL) 25 MG tablet 1/2-1 tablet up to three times a day as needed for anxiety (Patient not taking: No sig reported)   lisinopril (ZESTRIL) 20 MG tablet TAKE 1 TABLET BY MOUTH EVERY DAY   Multiple Vitamins-Minerals (MULTIVITAMIN PO) Take by mouth.   naproxen sodium (ALEVE) 220 MG tablet Take 220 mg by mouth as needed.   Probiotic Product (PROBIOTIC PO) Take by mouth as needed.   rosuvastatin (CRESTOR) 5 MG tablet TAKE 1 TABLET (5 MG TOTAL) BY MOUTH DAILY.   valACYclovir (VALTREX) 500 MG tablet TAKE 1 TABLET BY MOUTH TWICE A DAY AS NEEDED (Patient not taking: No sig reported)   No facility-administered encounter medications on file as of 06/14/2021.   Current Medications: Lisinopril 20 mg last filled Rosuvastatin 5 mg last filled 05/24/2021 90 DS Fluoxetine 20 mg last filled Hydroxyzine 25 mg last filled Naproxen Sodium (Aleve) Valacyclovir 500 mg Muliple  Vitamin Calcium-Vitamin D 250-125 mg Cyanocobalamin (Vitamin B-12) Probiotic  **Patient rescheduled this appointment for 07/22/2021 at 11:00 am. Patient states she is going to be out of town during the time of her original appointment.  Future Appointments  Date Time Provider [place]  07/22/2021 11:00 AM [facility] CCM PHARMACIST [facility]  01/30/2022  1:00 PM [facility] HEALTH COACH [facility]      Star Rating Drugs: Lisinopril 05/30/2021 90 DS Rosuvastatin 5 mg last filled 05/24/2021 90 DS  [name], [place] Pharmacist Assistant [phone]

## 2021-06-18 ENCOUNTER — Telehealth: Payer: PPO

## 2021-07-03 ENCOUNTER — Encounter: Payer: Self-pay | Admitting: Family Medicine

## 2021-07-04 NOTE — Telephone Encounter (Signed)
Please see message. °

## 2021-07-17 ENCOUNTER — Telehealth: Payer: Self-pay | Admitting: Pharmacist

## 2021-07-17 NOTE — Chronic Care Management (AMB) (Signed)
Chronic Care Management Pharmacy Assistant   Name: [name]  MRN: [id number] DOB: [birth year]  [name] is an 70 y.o. year old female who presents for his initial CCM visit with the clinical pharmacist.  Reason for Encounter: Chart Prep For Initial Visit With Clinical Pharmacist   Conditions to be addressed/monitored: HTN, GERD, OA, HLD, Seasonal Allergies  Primary concerns for visit include: HTN, HLD  Recent office visits:  02/04/2021 [facility] (PCP) Orma Flaming, MD; start on prozac, follow up in 1 month  Recent consult visits:  04/01/2021 [facility] (dermatology) [place]verstock, Christine L; no further information available.  Hospital visits:  None in previous 6 months  Medications: Outpatient Encounter Medications as of 07/17/2021  Medication Sig   calcium-vitamin D (OSCAL WITH D) 250-125 MG-UNIT per tablet Take 1 tablet by mouth as needed.    Cyanocobalamin (VITAMIN B 12 PO) Take by mouth daily.    FLUoxetine (PROZAC) 20 MG capsule Take 1 capsule (20 mg total) by mouth daily.   hydrOXYzine (ATARAX/VISTARIL) 25 MG tablet 1/2-1 tablet up to three times a day as needed for anxiety (Patient not taking: No sig reported)   lisinopril (ZESTRIL) 20 MG tablet TAKE 1 TABLET BY MOUTH EVERY DAY   Multiple Vitamins-Minerals (MULTIVITAMIN PO) Take by mouth.   naproxen sodium (ALEVE) 220 MG tablet Take 220 mg by mouth as needed.   Probiotic Product (PROBIOTIC PO) Take by mouth as needed.   rosuvastatin (CRESTOR) 5 MG tablet TAKE 1 TABLET (5 MG TOTAL) BY MOUTH DAILY.   valACYclovir (VALTREX) 500 MG tablet TAKE 1 TABLET BY MOUTH TWICE A DAY AS NEEDED (Patient not taking: No sig reported)   No facility-administered encounter medications on file as of 07/17/2021.   Current Medications: Lisinopril 20 mg last filled 05/30/2021 90 DS Rosuvastatin 20 mg last filled 05/24/2021 90 DS Fluoxetine 20 mg no longer taking Hydroxyzine 25 mg no longer taking Flonase buys otc Naproxen Sodium 220 mg as  needed to play golf Valacyclovir 500 mg as needed Multiple Vitamin daily Calcium-Vitamin D 600-20 mg Cyanocobalamin B 12 500 mcg Probiotic PO [employer]  Patient Questions:  Any changes in your medications or health? Patient states she has not had any changes in her medications or health.  Any side effects from any medications?  Patient states she has not had any issues or side effects from any of her medications. She states she was having some issues with Rosuvastatin. She states she stopped taking it for a while and started back. She hasn't had any issues since starting back.  Do you have any symptoms or problems not managed by your medications? Patient states she does not have any symptoms or problems that are not currently managed by her medications.  Any concerns about your health right now? Patient states she doesn't have any concerns at this time. Patient states she feels good to be 62 years oldl  Has your provider asked that you check blood pressure, blood sugar, or follow special diet at home? Patient states she does not currently check her blood pressure or blood sugar. She does not follow any special diet but she does cook most of her meals at home.  Do you get any type of exercise on a regular basis? Patient states she plays golf regularly.  Can you think of a goal you would like to reach for your health? She states she would like to lose 7-8 lbs.  Do you have any problems getting your medications? She denies having any  problems getting her medications.  Is there anything that you would like to discuss during the appointment?  Patient states she would like to find a [facility] PCP at [facility] if possible.  Please bring medications and supplements to appointment   Care Gaps: Medicare Annual Wellness: Completed 01/2021 Hemoglobin A1C: 5.8% on 06/08/2017 Colonoscopy: Next due on 08/04/2024 Tetanus/DTAP: Next due on 10/20/2021 Dexa Scan: Completed Mammogram: Overdue since  04/07/2021  , Future Appointments  Date Time Provider [place]  07/22/2021 11:00 AM [facility] CCM PHARMACIST [facility]  01/30/2022  1:00 PM [facility] HEALTH COACH [facility]    Star Rating Drugs:  [name], [place] Pharmacist Assistant [phone]

## 2021-07-22 ENCOUNTER — Ambulatory Visit (INDEPENDENT_AMBULATORY_CARE_PROVIDER_SITE_OTHER): Payer: PPO | Admitting: Pharmacist

## 2021-07-22 ENCOUNTER — Telehealth: Payer: Self-pay | Admitting: Pharmacist

## 2021-07-22 ENCOUNTER — Telehealth: Payer: PPO

## 2021-07-22 DIAGNOSIS — I10 Essential (primary) hypertension: Secondary | ICD-10-CM

## 2021-07-22 DIAGNOSIS — F411 Generalized anxiety disorder: Secondary | ICD-10-CM

## 2021-07-22 DIAGNOSIS — E782 Mixed hyperlipidemia: Secondary | ICD-10-CM

## 2021-07-22 NOTE — Chronic Care Management (AMB) (Signed)
Erroneous Encounter

## 2021-07-22 NOTE — Patient Instructions (Addendum)
Visit Information   Goals Addressed             This Visit's Progress    Track and Manage My Blood Pressure-Hypertension       Timeframe:  Long-Range Goal Priority:  High Start Date:  07/22/21                           Expected End Date:  01/20/22                   Follow Up Date 10/22/21    - check blood pressure weekly - choose a place to take my blood pressure (home, clinic or office, retail store) - write blood pressure results in a log or diary    Why is this important?   You won't feel high blood pressure, but it can still hurt your blood vessels.  High blood pressure can cause heart or kidney problems. It can also cause a stroke.  Making lifestyle changes like losing a little weight or eating less salt will help.  Checking your blood pressure at home and at different times of the day can help to control blood pressure.  If the doctor prescribes medicine remember to take it the way the doctor ordered.  Call the office if you cannot afford the medicine or if there are questions about it.     Notes:        Patient Care Plan: General Pharmacy (Adult)     Problem Identified: HTN, HLD, GAD   Priority: High  Onset Date: 07/22/2021     Long-Range Goal: Patient Stated   Start Date: 07/22/2021  Expected End Date: 01/20/2022  This Visit's Progress: On track  Priority: High  Note:   Current Barriers:  None at this time  Pharmacist Clinical Goal(s):  Patient will maintain control of BP as evidenced by monitoring  through collaboration with PharmD and provider.   Interventions: 1:1 collaboration with No primary care provider on file. regarding development and update of comprehensive plan of care as evidenced by provider attestation and co-signature Inter-disciplinary care team collaboration (see longitudinal plan of care) Comprehensive medication review performed; medication list updated in electronic medical record  Hypertension (BP goal  <140/90) -Controlled -Current treatment: Lisinopril 20mg  daily -Medications previously tried: none noted  -Current home readings: not checking at home -Current dietary habits: cooks at home 4-5 nights per week -Current exercise habits: plays golf 3-4 times per week, will walk 9 holes sometimes and then ride the second 9 holes -Denies hypotensive/hypertensive symptoms -Educated on BP goals and benefits of medications for prevention of heart attack, stroke and kidney damage; Daily salt intake goal < 2300 mg; Exercise goal of 150 minutes per week; Importance of home blood pressure monitoring; -Counseled to monitor BP at home periodically, document, and provide log at future appointments -Counseled on diet and exercise extensively Recommended to continue current medication  Hyperlipidemia: (LDL goal < 100) -Controlled -Current treatment: Rosuvastatin 5mg  daily -Medications previously tried: none noted  -Current dietary patterns: see above -Current exercise habits: see above -Educated on Cholesterol goals;  Benefits of statin for ASCVD risk reduction; Importance of limiting foods high in cholesterol; She did stop her Crestor for a brief period of time, however she is now back on medication and her joint pain/cramping has improved -Counseled on diet and exercise extensively Recommended to continue current medication Most recent LDL is controlled  Depression/Anxiety (Goal: Minimze symptoms) -Controlled -Current treatment: -Medications previously  tried/failed: Fluoxetine, hydorxyzine -PHQ9:  PHQ9 SCORE ONLY 02/04/2021 01/21/2021 06/04/2020  PHQ-9 Total Score 0 0 1  -Educated on Benefits of medication for symptom control -She did not want to take these meds anymore, the made her feel "cloudy" -Reports stable mood, will often go for a long walk to clear her head if she needs to -Continue current management strategies  Patient Goals/Self-Care Activities Patient will:  - take  medications as prescribed check blood pressure periodically, document, and provide at future appointments target a minimum of 150 minutes of moderate intensity exercise weekly  Follow Up Plan: The care management team will reach out to the patient again over the next 180 days.        Ms. Vary was given information about Chronic Care Management services today including:  CCM service includes personalized support from designated clinical staff supervised by her physician, including individualized plan of care and coordination with other care providers 24/7 contact phone numbers for assistance for urgent and routine care needs. Standard insurance, coinsurance, copays and deductibles apply for chronic care management only during months in which we provide at least 20 minutes of these services. Most insurances cover these services at 100%, however patients may be responsible for any copay, coinsurance and/or deductible if applicable. This service may help you avoid the need for more expensive face-to-face services. Only one practitioner may furnish and bill the service in a calendar month. The patient may stop CCM services at any time (effective at the end of the month) by phone call to the office staff.  Patient agreed to services and verbal consent obtained.   The patient verbalized understanding of instructions, educational materials, and care plan provided today and agreed to receive a mailed copy of patient instructions, educational materials, and care plan.  Telephone follow up appointment with pharmacy team member scheduled for: 6 months  Edythe Clarity, [place]

## 2021-07-22 NOTE — Progress Notes (Signed)
Chronic Care Management Pharmacy Note  07/22/2021 Name:  [name] MRN:  [id number] DOB:  06/16/1951  Summary: Initial visit with PharmD.  Med list reviewed and updated.  Stopped Crestor for brief time due to muscle pains.  Now back on it taking it 100% of the time and muscle pains have improved.  She increased her walking and feels that this helps.  FYI patient plans to establish with Inda Coke for PCP.  Recommendations/Changes made from today's visit: None  Plan: FU 6 months   Subjective: [name] is an 70 y.o. year old female who is a primary patient of No primary care provider on file..  The CCM team was consulted for assistance with disease management and care coordination needs.    Engaged with patient by telephone for initial visit in response to provider referral for pharmacy case management and/or care coordination services.   Consent to Services:  The patient was given the following information about Chronic Care Management services today, agreed to services, and gave verbal consent: 1. CCM service includes personalized support from designated clinical staff supervised by the primary care provider, including individualized plan of care and coordination with other care providers 2. 24/7 contact phone numbers for assistance for urgent and routine care needs. 3. Service will only be billed when office clinical staff spend 20 minutes or more in a month to coordinate care. 4. Only one practitioner may furnish and bill the service in a calendar month. 5.The patient may stop CCM services at any time (effective at the end of the month) by phone call to the office staff. 6. The patient will be responsible for cost sharing (co-pay) of up to 20% of the service fee (after annual deductible is met). Patient agreed to services and consent obtained.  Patient Care Team: Regina Eck, CNM as Consulting Physician (Certified Nurse Midwife) Katy Apo, MD as Consulting  Physician (Ophthalmology) Renda Rolls, Jennefer Bravo, MD as Consulting Physician (Dermatology) Earlie Server, MD as Consulting Physician (Orthopedic Surgery) Edythe Clarity, [facility] as Pharmacist (Pharmacist)  Recent office visits:  02/04/2021 OV (PCP) Orma Flaming, MD; start on prozac, follow up in 1 month   Recent consult visits:  04/01/2021 OV (dermatology) [place], Christine L; no further information available.   Hospital visits:  None in previous 6 months    Objective:  Lab Results  Component Value Date   CREATININE 0.66 02/04/2021   BUN 19 02/04/2021   GFR 88.48 02/04/2021   GFRNONAA 87.71 05/22/2010   GFRAA 95 04/13/2008   NA 138 02/04/2021   K 4.3 02/04/2021   CALCIUM 9.6 02/04/2021   CO2 29 02/04/2021   GLUCOSE 85 02/04/2021    Lab Results  Component Value Date/Time   HGBA1C 5.8 06/08/2017 10:11 AM   HGBA1C 5.6 09/17/2015 08:39 AM   GFR 88.48 02/04/2021 10:05 AM   GFR 90.22 08/19/2019 08:18 AM   MICROALBUR <0.7 02/04/2021 10:05 AM    Last diabetic Eye exam: No results found for: HMDIABEYEEXA  Last diabetic Foot exam: No results found for: HMDIABFOOTEX   Lab Results  Component Value Date   CHOL 167 02/04/2021   HDL 69.20 02/04/2021   LDLCALC 137 (H) 08/19/2019   LDLDIRECT 89.0 02/04/2021   TRIG 201.0 (H) 02/04/2021   CHOLHDL 2 02/04/2021    Hepatic Function Latest Ref Rng & Units 02/04/2021 08/19/2019 07/21/2018  Total Protein 6.0 - 8.3 g/dL 6.5 6.0 6.5  Albumin 3.5 - 5.2 g/dL 4.0 4.0 4.1  AST 0 - 37  U/L 23 21 23   ALT 0 - 35 U/L 23 20 24   Alk Phosphatase 39 - 117 U/L 70 74 69  Total Bilirubin 0.2 - 1.2 mg/dL 0.4 0.6 0.5  Bilirubin, Direct 0.0 - 0.3 mg/dL - - -    Lab Results  Component Value Date/Time   TSH 1.45 01/03/2014 08:24 AM   TSH 1.43 09/30/2011 09:33 AM    CBC Latest Ref Rng & Units 02/04/2021 08/19/2019 07/21/2018  WBC 4.0 - 10.5 K/uL 5.8 6.0 5.8  Hemoglobin 12.0 - 15.0 g/dL 13.4 13.6 14.1  Hematocrit 36.0 - 46.0 % 40.6 40.6  41.8  Platelets 150.0 - 400.0 K/uL 241.0 230.0 207.0    Lab Results  Component Value Date/Time   VD25OH 36 10/31/2016 03:13 PM    Clinical ASCVD: No  The 10-year ASCVD risk score (Arnett DK, et al., 2019) is: 17.8%   Values used to calculate the score:     Age: 58 years     Sex: Female     Is Non-Hispanic African American: No     Diabetic: No     Tobacco smoker: No     Systolic Blood Pressure: 003 mmHg     Is BP treated: Yes     HDL Cholesterol: 69.2 mg/dL     Total Cholesterol: 167 mg/dL    Depression screen [facility] 2/9 02/04/2021 01/21/2021 06/04/2020  Decreased Interest 0 0 0  Down, Depressed, Hopeless 0 0 0  PHQ - 2 Score 0 0 0  Altered sleeping - - 0  Tired, decreased energy - - 1  Change in appetite - - 0  Feeling bad or failure about yourself  - - 0  Trouble concentrating - - 0  Moving slowly or fidgety/restless - - 0  Suicidal thoughts - - 0  PHQ-9 Score - - 1  Difficult doing work/chores - - Not difficult at all     Social History   Tobacco Use  Smoking Status Never  Smokeless Tobacco Never   BP Readings from Last 3 Encounters:  02/04/21 (!) 152/80  06/04/20 (!) 152/84  05/02/20 130/70   Pulse Readings from Last 3 Encounters:  02/04/21 61  06/04/20 73  05/02/20 62   Wt Readings from Last 3 Encounters:  02/04/21 159 lb 9.6 oz (72.4 kg)  06/04/20 158 lb 12.8 oz (72 kg)  05/23/20 160 lb (72.6 kg)   BMI Readings from Last 3 Encounters:  02/04/21 26.76 kg/m  06/04/20 26.63 kg/m  05/23/20 26.83 kg/m    Assessment/Interventions: Review of patient past medical history, allergies, medications, health status, including review of consultants reports, laboratory and other test data, was performed as part of comprehensive evaluation and provision of chronic care management services.   SDOH:  (Social Determinants of Health) assessments and interventions performed: Yes  Financial Resource Strain: Low Risk    Difficulty of Paying Living Expenses: Not hard at  all   s SDOH Screenings   Alcohol Screen: Not on file  Depression (PHQ2-9): Low Risk    PHQ-2 Score: 0  Financial Resource Strain: Low Risk    Difficulty of Paying Living Expenses: Not hard at all  Food Insecurity: No Food Insecurity   Worried About Charity fundraiser in the Last Year: Never true   Ran Out of Food in the Last Year: Never true  Housing: Low Risk    Last Housing Risk Score: 0  Physical Activity: Sufficiently Active   Days of Exercise per Week: 2 days   Minutes  of Exercise per Session: 120 min  Social Connections: Socially Integrated   Frequency of Communication with Friends and Family: More than three times a week   Frequency of Social Gatherings with Friends and Family: More than three times a week   Attends Religious Services: More than 4 times per year   Active Member of [employer] or Organizations: Yes   Attends Archivist Meetings: 1 to 4 times per year   Marital Status: Married  Stress: No Stress Concern Present   Feeling of Stress : Only a little  Tobacco Use: Low Risk    Smoking Tobacco Use: Never   Smokeless Tobacco Use: Never  Transportation Needs: No Transportation Needs   Lack of Transportation (Medical): No   Lack of Transportation (Non-Medical): No    CCM Care Plan  Allergies  Allergen Reactions   Crestor [Rosuvastatin]    Sulfonamide Derivatives Hives    REACTION: hives   Cefdinir Rash    REACTION: rash    Medications Reviewed Today     Reviewed by Tobe Sos, CMA (Certified Medical Assistant) on 02/04/21 at [phone]  Med List Status: <None>   Medication Order Taking? Sig Documenting Provider Last Dose Status Informant  calcium-vitamin D (OSCAL WITH D) 250-125 MG-UNIT per tablet [id number]  Take 1 tablet by mouth as needed.  [provider]  Active   Cyanocobalamin (VITAMIN B 12 PO) [id number]  Take by mouth daily.  [provider]  Active   hydrOXYzine (ATARAX/VISTARIL) 25 MG tablet [id number] No 1/2-1 tablet  up to three times a day as needed for anxiety  Patient not taking: No sig reported   Orma Flaming, MD Not Taking Active   lisinopril (ZESTRIL) 20 MG tablet [id number]  TAKE 1 TABLET BY MOUTH EVERY DAY Orma Flaming, MD  Active   Multiple Vitamins-Minerals (MULTIVITAMIN PO) [id number]  Take by mouth. [provider]  Active   naproxen sodium (ALEVE) 220 MG tablet [id number]  Take 220 mg by mouth as needed. [provider]  Active   Probiotic Product (PROBIOTIC PO) [id number]  Take by mouth as needed. [provider]  Active   rosuvastatin (CRESTOR) 5 MG tablet [id number]  Take 1 tablet (5 mg total) by mouth daily.  Patient taking differently: Take 5 mg by mouth daily. Pt taking every other day   Orma Flaming, MD  Active   valACYclovir (VALTREX) 500 MG tablet [id number]  TAKE 1 TABLET BY MOUTH TWICE A DAY AS NEEDED  Patient not taking: Reported on 01/21/2021   Orma Flaming, MD  Active             Patient Active Problem List   Diagnosis Date Noted   Essential hypertension 09/28/2019   Hiatal hernia 08/15/2019   Osteoarthritis of knee 08/15/2019   Hyperlipidemia 05/17/2018   Senile purpura ([place]) 06/08/2017   Seasonal allergies 09/17/2015   GAD (generalized anxiety disorder) 03/12/2015   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   VARICOSE VEIN 01/14/2008   GERD 07/14/2007    Immunization History  Administered Date(s) Administered   Fluad Quad(high Dose 65+) 07/25/2019, 07/25/2020, 07/03/2021   Influenza Split 08/19/2011, 07/13/2012   Influenza Whole 07/21/2008   Influenza, High Dose Seasonal PF 08/21/2015, 07/16/2016, 07/31/2017, 07/21/2018, 07/03/2021   Influenza,inj,Quad PF,6+ Mos 07/12/2013, 07/27/2014   PFIZER(Purple Top)SARS-COV-2 Vaccination 12/02/2019, 12/24/2019, 08/15/2020   Pneumococcal Conjugate-13 03/12/2015   Pneumococcal Polysaccharide-23 07/16/2016   Td 10/20/1996, 04/14/2008   Tdap 10/21/2011   Zoster Recombinat (Shingrix) 08/04/2019, 11/16/2019,  07/03/2021   Zoster,  Live 10/07/2011    Conditions to be addressed/monitored:  HTN, GERD/Hiatal hernia, GAD, HLD  Care Plan : General Pharmacy (Adult)  Updates made by Edythe Clarity, RPH since 07/22/2021 12:00 AM     Problem: HTN, HLD, GAD   Priority: High  Onset Date: 07/22/2021     Long-Range Goal: Patient Stated   Start Date: 07/22/2021  Expected End Date: 01/20/2022  This Visit's Progress: On track  Priority: High  Note:   Current Barriers:  None at this time  Pharmacist Clinical Goal(s):  Patient will maintain control of BP as evidenced by monitoring  through collaboration with PharmD and provider.   Interventions: 1:1 collaboration with No primary care provider on file. regarding development and update of comprehensive plan of care as evidenced by provider attestation and co-signature Inter-disciplinary care team collaboration (see longitudinal plan of care) Comprehensive medication review performed; medication list updated in electronic medical record  Hypertension (BP goal <140/90) -Controlled -Current treatment: Lisinopril 55m daily -Medications previously tried: none noted  -Current home readings: not checking at home -Current dietary habits: cooks at home 4-5 nights per week -Current exercise habits: plays golf 3-4 times per week, will walk 9 holes sometimes and then ride the second 9 holes -Denies hypotensive/hypertensive symptoms -Educated on BP goals and benefits of medications for prevention of heart attack, stroke and kidney damage; Daily salt intake goal < 2300 mg; Exercise goal of 150 minutes per week; Importance of home blood pressure monitoring; -Counseled to monitor BP at home periodically, document, and provide log at future appointments -Counseled on diet and exercise extensively Recommended to continue current medication  Hyperlipidemia: (LDL goal < 100) -Controlled -Current treatment: Rosuvastatin 583mdaily -Medications previously tried:  none noted  -Current dietary patterns: see above -Current exercise habits: see above -Educated on Cholesterol goals;  Benefits of statin for ASCVD risk reduction; Importance of limiting foods high in cholesterol; She did stop her Crestor for a brief period of time, however she is now back on medication and her joint pain/cramping has improved -Counseled on diet and exercise extensively Recommended to continue current medication Most recent LDL is controlled  Depression/Anxiety (Goal: Minimze symptoms) -Controlled -Current treatment: -Medications previously tried/failed: Fluoxetine, hydorxyzine -PHQ9:  PHQ9 SCORE ONLY 02/04/2021 01/21/2021 06/04/2020  PHQ-9 Total Score 0 0 1  -Educated on Benefits of medication for symptom control -She did not want to take these meds anymore, the made her feel "cloudy" -Reports stable mood, will often go for a long walk to clear her head if she needs to -Continue current management strategies  Patient Goals/Self-Care Activities Patient will:  - take medications as prescribed check blood pressure periodically, document, and provide at future appointments target a minimum of 150 minutes of moderate intensity exercise weekly  Follow Up Plan: The care management team will reach out to the patient again over the next 180 days.         Medication Assistance: None required.  Patient affirms current coverage meets needs.  Compliance/Adherence/Medication fill history: Care Gaps: Mammogram due  Star-[facility] Drugs: 05/24/21 90ds Lisinopril 1029m8/11/22 90ds Rosuvastatin 5mg88mily  Patient's preferred pharmacy is:  [id number] [place] [id number]: [phone] pill box? No - only two pills Pt endorses 100% compliance  We discussed: Benefits of medication synchronization, packaging and delivery as well as enhanced pharmacist oversight with [facility]. Patient  decided to: Utilize [facility] for medication synchronization, packaging and delivery  Verbal consent obtained for [facility] enhanced pharmacy services (medication synchronization, adherence packaging, delivery coordination). A medication sync plan was created to allow patient to get all medications delivered once every 30 to 90 days per patient preference. Patient understands they have freedom to choose pharmacy and clinical pharmacist will coordinate care between all prescribers and [facility].  Care Plan and Follow Up Patient Decision:  Patient agrees to Care Plan and Follow-up.  Plan: The care management team will reach out to the patient again over the next 180 days.  Beverly Milch, PharmD Clinical Pharmacist [phone]

## 2021-07-26 ENCOUNTER — Telehealth: Payer: Self-pay | Admitting: Pharmacist

## 2021-07-26 NOTE — Progress Notes (Signed)
    Chronic Care Management Pharmacy Assistant   Name: [name]  MRN: [id number] DOB: 1951-04-06  Reason for Encounter: CCM Care Plan   Medications: Outpatient Encounter Medications as of 07/26/2021  Medication Sig   calcium-vitamin D (OSCAL WITH D) 250-125 MG-UNIT per tablet Take 1 tablet by mouth as needed.    Cyanocobalamin (VITAMIN B 12 PO) Take by mouth daily.    FLUoxetine (PROZAC) 20 MG capsule Take 1 capsule (20 mg total) by mouth daily.   hydrOXYzine (ATARAX/VISTARIL) 25 MG tablet 1/2-1 tablet up to three times a day as needed for anxiety (Patient not taking: No sig reported)   lisinopril (ZESTRIL) 20 MG tablet TAKE 1 TABLET BY MOUTH EVERY DAY   Multiple Vitamins-Minerals (MULTIVITAMIN PO) Take by mouth.   naproxen sodium (ALEVE) 220 MG tablet Take 220 mg by mouth as needed.   Probiotic Product (PROBIOTIC PO) Take by mouth as needed.   rosuvastatin (CRESTOR) 5 MG tablet TAKE 1 TABLET (5 MG TOTAL) BY MOUTH DAILY.   valACYclovir (VALTREX) 500 MG tablet TAKE 1 TABLET BY MOUTH TWICE A DAY AS NEEDED   No facility-administered encounter medications on file as of 07/26/2021.   Reviewed the patients initial visit reinsured it was completed per the pharmacist Leata Mouse request. Printed the CCM care plan. Mailed the patient CCM care plan to their most recent address on file.  Follow-Up:Pharmacist Review  Charlann Lange, [place] Pharmacist Assistant [phone]

## 2021-08-14 ENCOUNTER — Other Ambulatory Visit: Payer: Self-pay | Admitting: Physician Assistant

## 2021-08-14 MED ORDER — ROSUVASTATIN CALCIUM 5 MG PO TABS: 5.0000 mg | ORAL_TABLET | Freq: Every day | ORAL | 0 refills | Status: DC

## 2021-08-14 MED ORDER — LISINOPRIL 20 MG PO TABS: 20.0000 mg | ORAL_TABLET | Freq: Every day | ORAL | 0 refills | Status: DC

## 2021-08-19 DIAGNOSIS — I10 Essential (primary) hypertension: Secondary | ICD-10-CM

## 2021-08-19 DIAGNOSIS — E782 Mixed hyperlipidemia: Secondary | ICD-10-CM

## 2021-08-29 ENCOUNTER — Other Ambulatory Visit: Payer: Self-pay | Admitting: Family Medicine

## 2021-10-08 ENCOUNTER — Encounter: Payer: Self-pay | Admitting: Physician Assistant

## 2021-10-08 ENCOUNTER — Ambulatory Visit (INDEPENDENT_AMBULATORY_CARE_PROVIDER_SITE_OTHER): Payer: PPO | Admitting: Physician Assistant

## 2021-10-08 VITALS — BP 144/80 | HR 63 | Temp 97.9°F | Ht 64.75 in | Wt 159.0 lb

## 2021-10-08 DIAGNOSIS — Z8619 Personal history of other infectious and parasitic diseases: Secondary | ICD-10-CM | POA: Diagnosis not present

## 2021-10-08 DIAGNOSIS — B009 Herpesviral infection, unspecified: Secondary | ICD-10-CM | POA: Diagnosis not present

## 2021-10-08 DIAGNOSIS — I10 Essential (primary) hypertension: Secondary | ICD-10-CM

## 2021-10-08 DIAGNOSIS — D1722 Benign lipomatous neoplasm of skin and subcutaneous tissue of left arm: Secondary | ICD-10-CM | POA: Diagnosis not present

## 2021-10-08 DIAGNOSIS — J020 Streptococcal pharyngitis: Secondary | ICD-10-CM

## 2021-10-08 LAB — POCT RAPID STREP A (OFFICE): Rapid Strep A Screen: POSITIVE — AB

## 2021-10-08 MED ORDER — VALACYCLOVIR HCL 500 MG PO TABS: ORAL_TABLET | ORAL | 12 refills | Status: DC

## 2021-10-08 MED ORDER — AMOXICILLIN 500 MG PO CAPS: 500.0000 mg | ORAL_CAPSULE | Freq: Two times a day (BID) | ORAL | 0 refills | Status: AC

## 2021-10-08 NOTE — Progress Notes (Signed)
Subjective:    Patient ID: [name], female    DOB: 1951/05/24, 70 y.o.   MRN: [id number]  No chief complaint on file.   HPI 70 y.o. patient presents today for new patient establishment with me.  Patient was previously established with Dr. Rogers Blocker.  Acute Concerns: Sore throat - going on the last 4 days. Home COVID-19 test was negative.  She has felt some chills, but no fever or body aches.  No cough or headache.  No other symptoms.  Throat lozenges do not seem to be helping.  She is also requesting referral to general surgeon for removal of left shoulder lipoma.  She is also requesting refill of Valtrex to take as needed for intermittent cold sores.  Chronic Concerns: See PMH listed below, as well as A/P for details on issues we specifically discussed during today's visit.      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   Seasonal allergies    Spider veins 11/23/2013    Past Surgical History:  Procedure Laterality Date   BREAST BIOPSY     left   BREAST LUMPECTOMY  8/99   left breast- benign (lipoma)   COLONOSCOPY  2005   Dr Lyla Son   DENTAL SURGERY     Cyst on right bottom tooth   TOE SURGERY      Family History  Problem Relation Age of Onset   Heart disease Mother    Diabetes Mother    Cancer Mother        hodgekins lymphoma   Diabetes Sister    Diabetes Maternal Grandmother    Colon cancer Neg Hx    Stomach cancer Neg Hx     Social History   Tobacco Use   Smoking status: Never   Smokeless tobacco: Never  Vaping Use   Vaping Use: Never used  Substance Use Topics   Alcohol use: Yes    Alcohol/week: 8.0  standard drinks    Types: 8 Glasses of wine per week   Drug use: No     Allergies  Allergen Reactions   Crestor [Rosuvastatin]    Sulfonamide Derivatives Hives    REACTION: hives   Cefdinir Rash    REACTION: rash    Review of Systems NEGATIVE UNLESS OTHERWISE INDICATED IN HPI      Objective:     BP (!) 144/80    Pulse 63    Temp 97.9 F (36.6 C)    Ht 5' 4.75" (1.645 m)    Wt 159 lb (72.1 kg)    LMP 10/20/2000    SpO2 95%    BMI 26.66 kg/m   Wt Readings from Last 3 Encounters:  10/08/21 159 lb (72.1 kg)  02/04/21 159 lb 9.6 oz (72.4 kg)  06/04/20 158 lb 12.8 oz (72 kg)    BP Readings from Last 3 Encounters:  10/08/21 (!) 144/80  02/04/21 (!) 152/80  06/04/20 (!) 152/84     Physical Exam Vitals and nursing note reviewed.  Constitutional:      Appearance: Normal appearance. She is normal weight. She  is not toxic-appearing.  HENT:     Head: Normocephalic and atraumatic.     Right Ear: External ear normal.     Left Ear: External ear normal.     Nose: Nose normal.     Mouth/Throat:     Mouth: Mucous membranes are moist.     Pharynx: Posterior oropharyngeal erythema present. No oropharyngeal exudate.  Eyes:     Extraocular Movements: Extraocular movements intact.     Conjunctiva/sclera: Conjunctivae normal.     Pupils: Pupils are equal, round, and reactive to light.  Cardiovascular:     Rate and Rhythm: Normal rate and regular rhythm.     Pulses: Normal pulses.     Heart sounds: Normal heart sounds.  Pulmonary:     Effort: Pulmonary effort is normal.     Breath sounds: Normal breath sounds.  Musculoskeletal:        General: Normal range of motion.     Cervical back: Normal range of motion and neck supple.  Skin:    General: Skin is warm and dry.     Comments: Left supraclavicular freely mobile subcutaneous mass approx 3x3 cm, c/w likely lipoma  Neurological:     General: No focal deficit present.     Mental Status: She is alert and oriented to person,  place, and time.  Psychiatric:        Mood and Affect: Mood normal.        Behavior: Behavior normal.        Thought Content: Thought content normal.        Judgment: Judgment normal.       Assessment & Plan:   Problem List Items Addressed This Visit       Cardiovascular and Mediastinum   Essential hypertension - Primary     Other   H/O cold sores   Other Visit Diagnoses     Strep sore throat       Relevant Medications   valACYclovir (VALTREX) 500 MG tablet   Other Relevant Orders   POCT rapid strep A   Herpes       Relevant Medications   valACYclovir (VALTREX) 500 MG tablet   Lipoma of left shoulder       Relevant Orders   Ambulatory referral to General Surgery        Meds ordered this encounter  Medications   valACYclovir (VALTREX) 500 MG tablet    Sig: TAKE 1 TABLET BY MOUTH TWICE A DAY AS NEEDED    Dispense:  30 tablet    Refill:  12   amoxicillin (AMOXIL) 500 MG capsule    Sig: Take 1 capsule (500 mg total) by mouth 2 (two) times daily for 10 days.    Dispense:  20 capsule    Refill:  0   1. Essential hypertension Elevated.  She will continue to monitor at home and continue on lisinopril 20 mg.  May need to consider additional antihypertensive therapy if this trend continues.  She stays very active with golf and also has a well-balanced diet.  2. Strep sore throat Positive for strep pharyngitis via rapid test today in office.  We will treat with amoxicillin as directed.  Patient to recheck if worse or no better.  3. H/O cold sores 4. Herpes No active lesions.  Refilled Valtrex for her to take as needed.  5. Lipoma of left shoulder Referral to general surgeon for removal.  Plan to follow-up with me in about 6 months for fasting labs  and recheck or as needed.   This note was prepared with assistance of Systems analyst. Occasional wrong-word or sound-a-like substitutions may have occurred due to the inherent limitations of voice  recognition software.    M , PA-C

## 2021-10-24 ENCOUNTER — Encounter: Payer: Self-pay | Admitting: Physician Assistant

## 2021-10-25 ENCOUNTER — Other Ambulatory Visit: Payer: Self-pay

## 2021-10-25 DIAGNOSIS — D1722 Benign lipomatous neoplasm of skin and subcutaneous tissue of left arm: Secondary | ICD-10-CM

## 2021-10-29 DIAGNOSIS — L820 Inflamed seborrheic keratosis: Secondary | ICD-10-CM | POA: Diagnosis not present

## 2021-10-29 DIAGNOSIS — L821 Other seborrheic keratosis: Secondary | ICD-10-CM | POA: Diagnosis not present

## 2021-10-29 DIAGNOSIS — L578 Other skin changes due to chronic exposure to nonionizing radiation: Secondary | ICD-10-CM | POA: Diagnosis not present

## 2021-10-29 DIAGNOSIS — D225 Melanocytic nevi of trunk: Secondary | ICD-10-CM | POA: Diagnosis not present

## 2021-10-29 DIAGNOSIS — Z23 Encounter for immunization: Secondary | ICD-10-CM | POA: Diagnosis not present

## 2021-10-29 DIAGNOSIS — L814 Other melanin hyperpigmentation: Secondary | ICD-10-CM | POA: Diagnosis not present

## 2021-11-06 ENCOUNTER — Telehealth: Payer: Self-pay | Admitting: Pharmacist

## 2021-11-06 NOTE — Chronic Care Management (AMB) (Addendum)
° ° °  Chronic Care Management Pharmacy Assistant   Name: [name]  MRN: [id number] DOB: 05/08/1951   Reason for Encounter: Medication Coordination Call    Recent office visits:  10/08/2021 OV (PCP) Allwardt, Alyssa M, PA-C; Amoxicillin for strep pharyngitis. BP elevated, she will continue to monitor at home and continue on Lisinopril 20 mg. May need to consider additional antihypertensive therapy if this trend continues.  Recent consult visits:  None  Hospital visits:  None in previous 6 months  Medications: Outpatient Encounter Medications as of 11/06/2021  Medication Sig   calcium-vitamin D (OSCAL WITH D) 250-125 MG-UNIT per tablet Take 1 tablet by mouth as needed.    Cyanocobalamin (VITAMIN B 12 PO) Take by mouth daily.    FLUoxetine (PROZAC) 20 MG capsule Take 1 capsule (20 mg total) by mouth daily.   hydrOXYzine (ATARAX/VISTARIL) 25 MG tablet 1/2-1 tablet up to three times a day as needed for anxiety (Patient not taking: No sig reported)   lisinopril (ZESTRIL) 20 MG tablet Take 1 tablet (20 mg total) by mouth daily.   Multiple Vitamins-Minerals (MULTIVITAMIN PO) Take by mouth.   naproxen sodium (ALEVE) 220 MG tablet Take 220 mg by mouth as needed.   Probiotic Product (PROBIOTIC PO) Take by mouth as needed.   rosuvastatin (CRESTOR) 5 MG tablet Take 1 tablet (5 mg total) by mouth daily.   valACYclovir (VALTREX) 500 MG tablet TAKE 1 TABLET BY MOUTH TWICE A DAY AS NEEDED   No facility-administered encounter medications on file as of 11/06/2021.   Reviewed chart for medication changes ahead of medication coordination call.  BP Readings from Last 3 Encounters:  10/08/21 (!) 144/80  02/04/21 (!) 152/80  06/04/20 (!) 152/84    Lab Results  Component Value Date   HGBA1C 5.8 06/08/2017     Patient obtains medications through Vials  90 Days   Last adherence delivery included: Lisinopril 20 mg once a day Rosuvastatin 5 mg once a day  Patient is due for next adherence  delivery on: 11/19/2021. Called patient and reviewed medications and coordinated delivery.  This delivery to include: Lisinopril 20 mg once a day Rosuvastatin 5 mg once a day  Patient needs refills for: Lisinopril 20 mg once a day Rosuvastatin 5 mg once a day  Confirmed delivery date of 11/19/2021, advised patient that pharmacy will contact them the morning of delivery.   Care Gaps: Medicare Annual Wellness: Completed 01/21/2021  Hemoglobin A1C: 5.8% on 06/08/2017 Colonoscopy: Next due on 08/04/2024 Dexa Scan: Completed Mammogram: last completed 04/11/2019  Future Appointments  Date Time Provider [place]  01/30/2022  1:00 PM [place]   [name], [place] Pharmacist Assistant [phone]   7 minutes spent in review, coordination, and documentation.  Reviewed by: Beverly Milch, PharmD Clinical Pharmacist [phone]

## 2021-11-11 ENCOUNTER — Other Ambulatory Visit: Payer: Self-pay | Admitting: Physician Assistant

## 2021-11-14 DIAGNOSIS — D171 Benign lipomatous neoplasm of skin and subcutaneous tissue of trunk: Secondary | ICD-10-CM | POA: Insufficient documentation

## 2021-11-19 ENCOUNTER — Other Ambulatory Visit: Payer: Self-pay | Admitting: General Surgery

## 2021-11-19 DIAGNOSIS — D171 Benign lipomatous neoplasm of skin and subcutaneous tissue of trunk: Secondary | ICD-10-CM

## 2021-12-12 ENCOUNTER — Other Ambulatory Visit: Payer: Self-pay

## 2021-12-12 ENCOUNTER — Ambulatory Visit
Admission: RE | Admit: 2021-12-12 | Discharge: 2021-12-12 | Disposition: A | Discharge status: Home / Self Care | Payer: PPO | Source: Ambulatory Visit | Attending: General Surgery | Admitting: General Surgery

## 2021-12-12 DIAGNOSIS — I2510 Atherosclerotic heart disease of native coronary artery without angina pectoris: Secondary | ICD-10-CM | POA: Diagnosis not present

## 2021-12-12 DIAGNOSIS — D171 Benign lipomatous neoplasm of skin and subcutaneous tissue of trunk: Secondary | ICD-10-CM

## 2021-12-12 MED ORDER — IOPAMIDOL (ISOVUE-300) INJECTION 61%
75.0000 mL | Freq: Once | INTRAVENOUS | Status: AC | PRN
  Administered 2021-12-12: 75 mL via INTRAVENOUS

## 2021-12-24 ENCOUNTER — Institutional Professional Consult (permissible substitution): Payer: PPO | Admitting: Thoracic Surgery (Cardiothoracic Vascular Surgery)

## 2021-12-24 ENCOUNTER — Other Ambulatory Visit: Payer: Self-pay

## 2021-12-24 ENCOUNTER — Encounter: Payer: Self-pay | Admitting: Thoracic Surgery (Cardiothoracic Vascular Surgery)

## 2021-12-24 DIAGNOSIS — D171 Benign lipomatous neoplasm of skin and subcutaneous tissue of trunk: Secondary | ICD-10-CM | POA: Diagnosis not present

## 2021-12-24 DIAGNOSIS — D179 Benign lipomatous neoplasm, unspecified: Secondary | ICD-10-CM | POA: Insufficient documentation

## 2021-12-24 NOTE — Progress Notes (Signed)
PCP is [name], Randa Evens, PA-C Referring Provider is Jovita Kussmaul, MD  Chief Complaint  Patient presents with   Lipoma    New patient consultation, supraclavicular lipoma possible removal    HPI: [name] is sent for consultation regarding a fatty mass in her left supraclavicular region.  [name] is a 71 year old [name] with a history of asthma, reflux, and anxiety disorder.  [name] first noticed a small nodule in the left supraclavicular area several years ago.  Over time it had gradually gotten slightly larger and now felt like there were 2 separate areas to it.  [name] saw [name] recently and was referred to [name].  He referred her on to [name] for possible removal.  [name] says this has become more noticeable over time.  [name] sometimes experiences discomfort in the area due to contact such as her bra strap.  [name] does not have pain there at baseline.  [name] does not have pain in the shoulder joint.  [name] has not noticed any redness or drainage from the area.   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   Seasonal allergies    Spider veins 11/23/2013    Past Surgical History:  Procedure Laterality Date   BREAST BIOPSY     left   BREAST LUMPECTOMY  8/99   left breast- benign (lipoma)   COLONOSCOPY  2005   Dr Lyla Son   DENTAL SURGERY     Cyst on right bottom tooth   TOE SURGERY      Family History  Problem Relation Age of Onset   Heart disease Mother    Diabetes Mother    Cancer Mother        hodgekins lymphoma   Diabetes Sister    Diabetes Maternal Grandmother    Colon cancer Neg Hx    Stomach cancer Neg Hx     Social  History Social History   Tobacco Use   Smoking status: Never   Smokeless tobacco: Never  Vaping Use   Vaping Use: Never used  Substance Use Topics   Alcohol use: Yes    Alcohol/week: 8.0 standard drinks    Types: 8 Glasses of wine per week   Drug use: No    Current Outpatient Medications  Medication Sig Dispense Refill   calcium-vitamin D (OSCAL WITH D) 250-125 MG-UNIT per tablet Take 1 tablet by mouth as needed.      Cyanocobalamin (VITAMIN B 12 PO) Take by mouth daily.      lisinopril (ZESTRIL) 20 MG tablet TAKE ONE TABLET BY MOUTH ONCE DAILY 90 tablet 0   Multiple Vitamins-Minerals (MULTIVITAMIN PO) Take by mouth.     naproxen sodium (ALEVE) 220 MG tablet Take 220 mg by mouth as needed.     Probiotic Product (PROBIOTIC PO) Take by mouth as needed.     rosuvastatin (CRESTOR) 5 MG tablet TAKE ONE TABLET BY MOUTH ONCE DAILY 90 tablet 0   valACYclovir (VALTREX) 500 MG tablet TAKE 1 TABLET BY MOUTH TWICE A DAY AS NEEDED 30 tablet 12   No current facility-administered  medications for this visit.    Allergies  Allergen Reactions   Crestor [Rosuvastatin]    Sulfonamide Derivatives Hives    REACTION: hives   Cefdinir Rash    REACTION: rash    Review of Systems  Constitutional:  Negative for activity change, fatigue and unexpected weight change.  Eyes:  Negative for visual disturbance.  Gastrointestinal:  Positive for abdominal pain (Frequent reflux).  Musculoskeletal:  Positive for neck pain (See HPI).       Left shoulder pain  Skin:        Varicose veins  Hematological:  Negative for adenopathy. Does not bruise/bleed easily.  Psychiatric/Behavioral:  The patient is nervous/anxious.   All other systems reviewed and are negative.  BP (!) 150/81 (BP Location: Right Arm, Patient Position: Sitting, Cuff Size: Normal)    Pulse 64    Resp 20    Ht 5' 4.75" (1.645 m)    Wt 158 lb 6.4 oz (71.8 kg)    LMP 10/20/2000    SpO2 97% Comment: RA   BMI 26.56 kg/m  Physical Exam Vitals  reviewed.  Constitutional:      General: [name] is not in acute distress.    Appearance: Normal appearance.  HENT:     Head: Normocephalic and atraumatic.  Eyes:     Extraocular Movements: Extraocular movements intact.  Neck:     Comments: 1 x 2 cm mobile subcutaneous fatty nodular mass in the left supraclavicular region Cardiovascular:     Rate and Rhythm: Normal rate and regular rhythm.     Heart sounds: No murmur heard.   No friction rub. No gallop.  Pulmonary:     Effort: Pulmonary effort is normal. No respiratory distress.     Breath sounds: Normal breath sounds.  Musculoskeletal:        General: Deformity (Left supraclavicular) present.  Skin:    General: Skin is warm and dry.  Neurological:     General: No focal deficit present.     Mental Status: [name] is alert and oriented to person, place, and time.     Cranial Nerves: No cranial nerve deficit.     Motor: No weakness.     Diagnostic Tests: CT CHEST WITH CONTRAST   TECHNIQUE: Multidetector CT imaging of the chest was performed during intravenous contrast administration.   RADIATION DOSE REDUCTION: This exam was performed according to the departmental dose-optimization program which includes automated exposure control, adjustment of the mA and/or kV according to patient size and/or use of iterative reconstruction technique.   CONTRAST:  44m ISOVUE-300 IOPAMIDOL (ISOVUE-300) INJECTION 61%   COMPARISON:  None.   FINDINGS: Cardiovascular: Mild coronary artery calcification. Global cardiac size within normal limits. No pericardial effusion. Central pulmonary arteries are of normal caliber. Mild atherosclerotic calcification within the thoracic aorta. Ascending aorta is of normal caliber. Proximal descending thoracic aorta is dilated measuring 3.5 cm in transaxial dimension on axial image # 74/2. Ascending aorta is of normal caliber. Distal descending thoracic aorta is of normal caliber.   Mediastinum/Nodes: 6 mm  nodule within the left thyroid gland. Not clinically significant; no follow-up imaging recommended (ref: J Am Coll Radiol. 2015 Feb;12(2): 143-50).No pathologic thoracic adenopathy. Esophagus unremarkable.   Lungs/Pleura: Tiny wedge like region of subpleural consolidation within the superior segment of the right lower lobe, axial image # 109/5, is nonspecific, possibly the sequela of remote trauma, infarction, or inflammation. The lungs are otherwise clear. No pneumothorax or pleural effusion. Central airways are widely patent.   Upper  Abdomen: Mild hepatic steatosis. Multiple simple cysts are seen within the visualized liver. No acute abnormality.   Musculoskeletal: No shoulder mass is clearly identified on this examination. 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 No acute bone abnormality. Degenerative changes are noted within the cervicothoracic spine. No lytic or blastic bone lesion.   IMPRESSION: No soft tissue mass identified in the region of the left shoulder. Degenerative changes noted.   Mild coronary artery calcification.   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 Recommend annual imaging followup by CTA or MRA. This recommendation follows 2010 ACCF/AHA/AATS/ACR/ASA/SCA/SCAI/SIR/STS/SVM Guidelines for the Diagnosis and Management of Patients with Thoracic Aortic Disease. Circulation.2010; 121: S496-P591. Aortic aneurysm NOS (ICD10-I71.9)   Tiny wedge like region of subpleural consolidation within the right lower lobe, possibly the sequela of remote trauma, infarction, or inflammation.   Mild hepatic steatosis   Aortic Atherosclerosis (ICD10-I70.0).  And     Electronically Signed   By: Fidela Salisbury M.D.   On: 12/15/2021 01:58 I personally reviewed the CT images.  There is no clear-cut soft  tissue mass although there is possibly an area of additional fatty tissue seen in the left supraclavicular area on the coronal views.  Impression: [name] is a [name] with a history of asthma, reflux, and anxiety disorder.  [name] has a soft nodular area in the left supraclavicular fossa.  This has been present for several years but has slowly gotten bigger.  It now causes her discomfort when her bra strap or blouse rubs on that area.  On exam clinically this is consistent with a lipoma.  There is no finding on the CT to suggest a deep lesion.  I suspect this is superficial and can be easily removed.  [name] is interested in surgical removal but wants to wait until after golf season to have it done.  Plan: [name] will return in 6 months to discuss possible removal If [name] notices additional growth in the meantime [name] will return sooner.  Erin Nakayama, MD [facility] [phone]

## 2022-01-13 DIAGNOSIS — M25562 Pain in left knee: Secondary | ICD-10-CM | POA: Diagnosis not present

## 2022-01-30 ENCOUNTER — Ambulatory Visit (INDEPENDENT_AMBULATORY_CARE_PROVIDER_SITE_OTHER): Payer: PPO

## 2022-01-30 DIAGNOSIS — Z0000 Encounter for general adult medical examination without abnormal findings: Secondary | ICD-10-CM | POA: Diagnosis not present

## 2022-01-30 NOTE — Progress Notes (Signed)
Virtual Visit via Telephone Note ? ?I connected with  [name] on 01/30/22 at  1:00 PM EDT by telephone and verified that I am speaking with the correct person using two identifiers. ? ?Medicare Annual Wellness visit completed telephonically due to Covid-19 pandemic.  ? ?Persons participating in this call: This Health Coach and this patient.  ? ?Location: ?Patient: Home ?Provider: Office  ?  ?I discussed the limitations, risks, security and privacy concerns of performing an evaluation and management service by telephone and the availability of in person appointments. The patient expressed understanding and agreed to proceed. ? ?Unable to perform video visit due to video visit attempted and failed and/or patient does not have video capability.  ? ?Some vital signs may be absent or patient reported.  ? ?Willette Brace, LPN ? ? ?Subjective:  ? [name] is a 71 y.o. female who presents for Medicare Annual (Subsequent) preventive examination. ? ?Review of Systems    ? ?Cardiac Risk Factors include: advanced age (>12mn, >>82women);hypertension;dyslipidemia ? ?   ?Objective:  ?  ?There were no vitals filed for this visit. ?There is no height or weight on file to calculate BMI. ? ? ?  01/30/2022  ?  1:07 PM 01/21/2021  ?  1:15 PM 07/25/2019  ? 11:44 AM 09/17/2015  ?  8:34 AM 08/04/2014  ? 11:26 AM 07/14/2014  ?  2:39 PM  ?Advanced Directives  ?Does Patient Have a Medical Advance Directive? Yes Yes Yes Yes No Yes  ?Type of Advance Directive Healthcare Power of Attorney Living will Living will;Healthcare Power of [place]of [place] ?Does patient want to make changes to medical advance directive?   No - Patient declined     ?[place]? Yes - validated most recent copy scanned in chart (See row information)  No - copy requested No - copy requested    ? ? ?Current Medications (verified) ?Outpatient Encounter Medications as of 01/30/2022   ?Medication Sig  ? calcium-vitamin D (OSCAL WITH D) 250-125 MG-UNIT per tablet Take 1 tablet by mouth as needed.   ? Cyanocobalamin (VITAMIN B 12 PO) Take by mouth daily.   ? lisinopril (ZESTRIL) 20 MG tablet TAKE ONE TABLET BY MOUTH ONCE DAILY  ? Multiple Vitamins-Minerals (MULTIVITAMIN PO) Take by mouth.  ? naproxen sodium (ALEVE) 220 MG tablet Take 220 mg by mouth as needed.  ? Probiotic Product (PROBIOTIC PO) Take by mouth as needed.  ? rosuvastatin (CRESTOR) 5 MG tablet TAKE ONE TABLET BY MOUTH ONCE DAILY  ? valACYclovir (VALTREX) 500 MG tablet TAKE 1 TABLET BY MOUTH TWICE A DAY AS NEEDED  ? ?No facility-administered encounter medications on file as of 01/30/2022.  ? ? ?Allergies (verified) ?Crestor [rosuvastatin], Sulfonamide derivatives, and Cefdinir  ? ?History: ?Past Medical History:  ?Diagnosis Date  ? Acute meniscal tear of left knee   ? treated by Dr CKatha Hammingdate pending  ? Asthma   ? as a child  ? Baker's cyst of knee   ? left knee-treated by Dr CFrench Ana ? GAD (generalized anxiety disorder) 03/12/2015  ? -with rare panic disorder related to caring for grandaughter   ? GERD (gastroesophageal reflux disease)   ? 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  ? Seasonal allergies   ? Spider veins 11/23/2013  ? ?Past Surgical History:  ?Procedure Laterality Date  ? BREAST BIOPSY    ? left  ? BREAST LUMPECTOMY  8/99  ?  left breast- benign (lipoma)  ? COLONOSCOPY  2005  ? Dr Lyla Son  ? DENTAL SURGERY    ? Cyst on right bottom tooth  ? TOE SURGERY    ? ?Family History  ?Problem Relation Age of Onset  ? Heart disease Mother   ? Diabetes Mother   ? Cancer Mother   ?     hodgekins lymphoma  ? Diabetes Sister   ? Diabetes Maternal Grandmother   ? Colon cancer Neg Hx   ? Stomach cancer Neg Hx   ? ?Social History  ? ?Socioeconomic History  ? Marital status: Married  ?  Spouse name: Not on file  ? Number of children: Not on file  ? Years of education: Not on file  ? Highest education level: Not on file  ?Occupational History  ?  Not on file  ?Tobacco Use  ? Smoking status: Never  ? Smokeless tobacco: Never  ?Vaping Use  ? Vaping Use: Never used  ?Substance and Sexual Activity  ? Alcohol use: Yes  ?  Alcohol/week: 8.0 standard drinks  ?  Types: 8 Glasses of wine per week  ? Drug use: No  ? Sexual activity: Yes  ?  Partners: Male  ?  Comment: husband vasectomy  ?Other Topics Concern  ? Not on file  ?Social History Narrative  ? Work or School: retired, husband is retired  ?   ? Home Situation: lives with husband  ?   ? Spiritual Beliefs: [place], Christian  ?   ? Lifestyle: healthy diet for the most part, plays golf and tennis  ?   ? ?Social Determinants of Health  ? ?Financial Resource Strain: Low Risk   ? Difficulty of Paying Living Expenses: Not hard at all  ?Food Insecurity: No Food Insecurity  ? Worried About Charity fundraiser in the Last Year: Never true  ? Ran Out of Food in the Last Year: Never true  ?Transportation Needs: No Transportation Needs  ? Lack of Transportation (Medical): No  ? Lack of Transportation (Non-Medical): No  ?Physical Activity: Sufficiently Active  ? Days of Exercise per Week: 2 days  ? Minutes of Exercise per Session: 120 min  ?Stress: No Stress Concern Present  ? Feeling of Stress : Not at all  ?Social Connections: Socially Integrated  ? Frequency of Communication with Friends and Family: More than three times a week  ? Frequency of Social Gatherings with Friends and Family: More than three times a week  ? Attends Religious Services: More than 4 times per year  ? Active Member of Clubs or Organizations: Yes  ? Attends Archivist Meetings: 1 to 4 times per year  ? Marital Status: Married  ? ? ?Tobacco Counseling ?Counseling given: Not Answered ? ? ?Clinical Intake: ? ?Pre-visit preparation completed: Yes ? ?Pain : No/denies pain ? ?  ? ?BMI - recorded: 26.56 ?Nutritional Status: BMI 25 -29 Overweight ?Nutritional Risks: None ?Diabetes: No ? ?How often do you need to have someone  help you when you read instructions, pamphlets, or other written materials from your doctor or pharmacy?: 1 - Never ? ?Diabetic?No ? ?Interpreter Needed?: No ? ?Information entered by :: Charlott Rakes, LPN ? ? ?Activities of Daily Living ? ?  01/30/2022  ?  1:09 PM  ?In your present state of health, do you have any difficulty performing the following activities:  ?Hearing? 0  ?Vision? 0  ?Difficulty concentrating or making decisions? 0  ?Walking or climbing  stairs? 0  ?Dressing or bathing? 0  ?Doing errands, shopping? 0  ?Preparing Food and eating ? N  ?Using the Toilet? N  ?In the past six months, have you accidently leaked urine? Y  ?Comment at times  ?Do you have problems with loss of bowel control? N  ?Managing your Medications? N  ?Managing your Finances? N  ?Housekeeping or managing your Housekeeping? N  ? ? ?Patient Care Team: ?Allwardt, Randa Evens, PA-C as PCP - General (Physician Assistant) ?Regina Eck, CNM as Consulting Physician (Certified Nurse Midwife) ?Katy Apo, MD as Consulting Physician (Ophthalmology) ?Haverstock, Jennefer Bravo, MD as Consulting Physician (Dermatology) ?Earlie Server, MD as Consulting Physician (Orthopedic Surgery) ?Edythe Clarity, [facility] as Pharmacist (Pharmacist) ? ?Indicate any recent Medical Services you may have received from other than [facility] providers in the past year (date may be approximate). ? ?   ?Assessment:  ? This is a routine wellness examination for Ronne. ? ?Hearing/Vision screen ?Hearing Screening - Comments:: Pt denies any hearing issues  ?Vision Screening - Comments:: Pt follows up with Dr Fraser Din for annual eye exams  ? ?Dietary issues and exercise activities discussed: ?Current Exercise Habits: Home exercise routine, Type of exercise: strength training/weights;walking;Other - see comments, Time (Minutes): > 60, Frequency (Times/Week): 2, Weekly Exercise (Minutes/Week): 0 ? ? Goals Addressed   ? ?  ?  ?  ?  ? This Visit's Progress  ?  Patient Stated      ?  Lose weight  ?  ? ?  ? ?Depression Screen ? ?  01/30/2022  ?  1:06 PM 02/04/2021  ?  9:36 AM 01/21/2021  ?  1:13 PM 06/04/2020  ?  2:40 PM 05/02/2020  ?  2:53 PM 07/25/2019  ? 11:45 AM 07/21/2018  ? 10:23 AM

## 2022-01-30 NOTE — Patient Instructions (Signed)
[name] , ?Thank you for taking time to come for your Medicare Wellness Visit. I appreciate your ongoing commitment to your health goals. Please review the following plan we discussed and let me know if I can assist you in the future.  ? ?Screening recommendations/referrals: ?Colonoscopy: Done 08/04/14 repeat every 10 years  ?Mammogram: Done 04/08/19 pt stated will schedule with GYN  ?Bone Density: Done 04/08/19 repeat every 2 years  ?Recommended yearly ophthalmology/optometry visit for glaucoma screening and checkup ?Recommended yearly dental visit for hygiene and checkup ? ?Vaccinations: ?Influenza vaccine: Done 10/29/21 repeat every year  ?Pneumococcal vaccine: Up to date ?Tdap vaccine: Due and discussed  ?Shingles vaccine: Completed 11/16/19, 07/03/21    ?Covid-19:Completed 2/12, 3/6, 08/15/20 ? ?Advanced directives: Copies in chart  ? ?Conditions/risks identified: Lose weight  ? ?Next appointment: Follow up in one year for your annual wellness visit  ? ? ?Preventive Care 51 Years and Older, Female ?Preventive care refers to lifestyle choices and visits with your health care provider that can promote health and wellness. ?What does preventive care include? ?A yearly physical exam. This is also called an annual well check. ?Dental exams once or twice a year. ?Routine eye exams. Ask your health care provider how often you should have your eyes checked. ?Personal lifestyle choices, including: ?Daily care of your teeth and gums. ?Regular physical activity. ?Eating a healthy diet. ?Avoiding tobacco and drug use. ?Limiting alcohol use. ?Practicing safe sex. ?Taking low-dose aspirin every day. ?Taking vitamin and mineral supplements as recommended by your health care provider. ?What happens during an annual well check? ?The services and screenings done by your health care provider during your annual well check will depend on your age, overall health, lifestyle risk factors, and family history of disease. ?Counseling   ?Your health care provider may ask you questions about your: ?Alcohol use. ?Tobacco use. ?Drug use. ?Emotional well-being. ?Home and relationship well-being. ?Sexual activity. ?Eating habits. ?History of falls. ?Memory and ability to understand (cognition). ?Work and work Statistician. ?Reproductive health. ?Screening  ?You may have the following tests or measurements: ?Height, weight, and BMI. ?Blood pressure. ?Lipid and cholesterol levels. These may be checked every 5 years, or more frequently if you are over 59 years old. ?Skin check. ?Lung cancer screening. You may have this screening every year starting at age 78 if you have a 30-pack-year history of smoking and currently smoke or have quit within the past 15 years. ?Fecal occult blood test (FOBT) of the stool. You may have this test every year starting at age 63. ?Flexible sigmoidoscopy or colonoscopy. You may have a sigmoidoscopy every 5 years or a colonoscopy every 10 years starting at age 47. ?Hepatitis C blood test. ?Hepatitis B blood test. ?Sexually transmitted disease (STD) testing. ?Diabetes screening. This is done by checking your blood sugar (glucose) after you have not eaten for a while (fasting). You may have this done every 1-3 years. ?Bone density scan. This is done to screen for osteoporosis. You may have this done starting at age 37. ?Mammogram. This may be done every 1-2 years. Talk to your health care provider about how often you should have regular mammograms. ?Talk with your health care provider about your test results, treatment options, and if necessary, the need for more tests. ?Vaccines  ?Your health care provider may recommend certain vaccines, such as: ?Influenza vaccine. This is recommended every year. ?Tetanus, diphtheria, and acellular pertussis (Tdap, Td) vaccine. You may need a Td booster every 10 years. ?Zoster vaccine. You  may need this after age 37. ?Pneumococcal 13-valent conjugate (PCV13) vaccine. One dose is recommended  after age 66. ?Pneumococcal polysaccharide (PPSV23) vaccine. One dose is recommended after age 22. ?Talk to your health care provider about which screenings and vaccines you need and how often you need them. ?This information is not intended to replace advice given to you by your health care provider. Make sure you discuss any questions you have with your health care provider. ?Document Released: 11/02/2015 Document Revised: 06/25/2016 Document Reviewed: 08/07/2015 ?Elsevier Interactive Patient Education ? 2017 [place]. ? ?Fall Prevention in the Home ?Falls can cause injuries. They can happen to people of all ages. There are many things you can do to make your home safe and to help prevent falls. ?What can I do on the outside of my home? ?Regularly fix the edges of walkways and driveways and fix any cracks. ?Remove anything that might make you trip as you walk through a door, such as a raised step or threshold. ?Trim any bushes or trees on the path to your home. ?Use bright outdoor lighting. ?Clear any walking paths of anything that might make someone trip, such as rocks or tools. ?Regularly check to see if handrails are loose or broken. Make sure that both sides of any steps have handrails. ?Any raised decks and porches should have guardrails on the edges. ?Have any leaves, snow, or ice cleared regularly. ?Use sand or salt on walking paths during winter. ?Clean up any spills in your garage right away. This includes oil or grease spills. ?What can I do in the bathroom? ?Use night lights. ?Install grab bars by the toilet and in the tub and shower. Do not use towel bars as grab bars. ?Use non-skid mats or decals in the tub or shower. ?If you need to sit down in the shower, use a plastic, non-slip stool. ?Keep the floor dry. Clean up any water that spills on the floor as soon as it happens. ?Remove soap buildup in the tub or shower regularly. ?Attach bath mats securely with double-sided non-slip rug tape. ?Do not  have throw rugs and other things on the floor that can make you trip. ?What can I do in the bedroom? ?Use night lights. ?Make sure that you have a light by your bed that is easy to reach. ?Do not use any sheets or blankets that are too big for your bed. They should not hang down onto the floor. ?Have a firm chair that has side arms. You can use this for support while you get dressed. ?Do not have throw rugs and other things on the floor that can make you trip. ?What can I do in the kitchen? ?Clean up any spills right away. ?Avoid walking on wet floors. ?Keep items that you use a lot in easy-to-reach places. ?If you need to reach something above you, use a strong step stool that has a grab bar. ?Keep electrical cords out of the way. ?Do not use floor polish or wax that makes floors slippery. If you must use wax, use non-skid floor wax. ?Do not have throw rugs and other things on the floor that can make you trip. ?What can I do with my stairs? ?Do not leave any items on the stairs. ?Make sure that there are handrails on both sides of the stairs and use them. Fix handrails that are broken or loose. Make sure that handrails are as long as the stairways. ?Check any carpeting to make sure that it is firmly  attached to the stairs. Fix any carpet that is loose or worn. ?Avoid having throw rugs at the top or bottom of the stairs. If you do have throw rugs, attach them to the floor with carpet tape. ?Make sure that you have a light switch at the top of the stairs and the bottom of the stairs. If you do not have them, ask someone to add them for you. ?What else can I do to help prevent falls? ?Wear shoes that: ?Do not have high heels. ?Have rubber bottoms. ?Are comfortable and fit you well. ?Are closed at the toe. Do not wear sandals. ?If you use a stepladder: ?Make sure that it is fully opened. Do not climb a closed stepladder. ?Make sure that both sides of the stepladder are locked into place. ?Ask someone to hold it for  you, if possible. ?Clearly mark and make sure that you can see: ?Any grab bars or handrails. ?First and last steps. ?Where the edge of each step is. ?Use tools that help you move around (mobility aids) if they ar

## 2022-02-05 ENCOUNTER — Other Ambulatory Visit: Payer: Self-pay | Admitting: Physician Assistant

## 2022-02-07 ENCOUNTER — Telehealth: Payer: Self-pay | Admitting: Pharmacist

## 2022-02-07 NOTE — Progress Notes (Signed)
? ? ?  Chronic Care Management ?Pharmacy Assistant  ? ?Name: Elyse Prevo  MRN: [id number] DOB: 06-07-1951 ? ?Reason for Encounter: Medication Coordination Call ?  ? ?Recent office visits:  ?None ? ?Recent consult visits:  ?None ? ?Hospital visits:  ?None in previous 6 months ? ?Medications: ?Outpatient Encounter Medications as of 02/07/2022  ?Medication Sig  ? calcium-vitamin D (OSCAL WITH D) 250-125 MG-UNIT per tablet Take 1 tablet by mouth as needed.   ? Cyanocobalamin (VITAMIN B 12 PO) Take by mouth daily.   ? lisinopril (ZESTRIL) 20 MG tablet TAKE ONE TABLET BY MOUTH ONCE DAILY  ? Multiple Vitamins-Minerals (MULTIVITAMIN PO) Take by mouth.  ? naproxen sodium (ALEVE) 220 MG tablet Take 220 mg by mouth as needed.  ? Probiotic Product (PROBIOTIC PO) Take by mouth as needed.  ? rosuvastatin (CRESTOR) 5 MG tablet TAKE ONE TABLET BY MOUTH ONCE DAILY  ? valACYclovir (VALTREX) 500 MG tablet TAKE 1 TABLET BY MOUTH TWICE A DAY AS NEEDED  ? ?No facility-administered encounter medications on file as of 02/07/2022.  ? ?Reviewed chart for medication changes ahead of medication coordination call. ? ?No OVs, Consults, or hospital visits since last care coordination call/Pharmacist visit.  ? ?No medication changes indicated. ? ?BP Readings from Last 3 Encounters:  ?12/24/21 (!) 150/81  ?10/08/21 (!) 144/80  ?02/04/21 (!) 152/80  ?  ?Lab Results  ?Component Value Date  ? HGBA1C 5.8 06/08/2017  ?  ? ?Patient obtains medications through Vials  90 Days  ? ?Last adherence delivery included:  ?Lisinopril 20 mg once daily ?Rosuvastatin 5 mg once daily ? ?Patient is due for next adherence delivery on: 02/18/2022. ?Called patient and reviewed medications and coordinated delivery. ? ?This delivery to include: ?Lisinopril 20 mg once daily ?Rosuvastatin 5 mg once daily ? ?Patient needs refills for: ?Lisinopril 20 mg once daily ?Rosuvastatin 5 mg once daily ?-Sent Rx refill request to team [facility]. ? ?-Patient states she will be out of town on  02/18/2022. She requests for her delivery to be changed until 02/24/2022 instead. She says she has enough medication to last her until this date. ? ?Confirmed delivery date of 02/24/2022, advised patient that pharmacy will contact them the morning of delivery. ? ?Care Gaps: ?Medicare Annual Wellness: Completed 01/30/2022 ?Hemoglobin A1C: 5.8% on 06/08/2017 ?Colonoscopy: Next due on 08/04/2024 ?Dexa Scan: Completed ?Mammogram: Overdue since 04/07/2021 ? ?Future Appointments  ?Date Time Provider [place]  ?05/05/2022  2:45 PM Megan Salon, MD [facility]  ?07/01/2022  2:30 PM Melrose Nakayama, MD [facility]  ?02/12/2023  1:00 PM [facility] HE[facility] COACH [facility]  ? ?[name], [place] ?Clinical Pharmacist Assistant ?[phone] ?

## 2022-02-13 DIAGNOSIS — M1711 Unilateral primary osteoarthritis, right knee: Secondary | ICD-10-CM | POA: Diagnosis not present

## 2022-03-26 ENCOUNTER — Encounter: Payer: Self-pay | Admitting: Physician Assistant

## 2022-03-27 ENCOUNTER — Other Ambulatory Visit: Payer: Self-pay | Admitting: Physician Assistant

## 2022-03-27 MED ORDER — SCOPOLAMINE 1 MG/3DAYS TD PT72: 1.0000 | MEDICATED_PATCH | TRANSDERMAL | 0 refills | Status: DC

## 2022-03-27 NOTE — Telephone Encounter (Signed)
Please advise 

## 2022-05-05 ENCOUNTER — Ambulatory Visit (HOSPITAL_BASED_OUTPATIENT_CLINIC_OR_DEPARTMENT_OTHER): Payer: PPO | Admitting: Obstetrics & Gynecology

## 2022-05-09 ENCOUNTER — Other Ambulatory Visit: Payer: Self-pay | Admitting: Physician Assistant

## 2022-05-09 ENCOUNTER — Telehealth: Payer: Self-pay | Admitting: Pharmacist

## 2022-05-09 NOTE — Progress Notes (Signed)
    Chronic Care Management Pharmacy Assistant   Name: Naya Ilagan  MRN: [id number] DOB: 1950-12-23   Reason for Encounter: Medication Coordination Call    Recent office visits:  None  Recent consult visits:  None  Hospital visits:  None in previous 6 months  Medications: Outpatient Encounter Medications as of 05/09/2022  Medication Sig   calcium-vitamin D (OSCAL WITH D) 250-125 MG-UNIT per tablet Take 1 tablet by mouth as needed.    Cyanocobalamin (VITAMIN B 12 PO) Take by mouth daily.    lisinopril (ZESTRIL) 20 MG tablet TAKE ONE TABLET BY MOUTH ONCE DAILY   Multiple Vitamins-Minerals (MULTIVITAMIN PO) Take by mouth.   naproxen sodium (ALEVE) 220 MG tablet Take 220 mg by mouth as needed.   Probiotic Product (PROBIOTIC PO) Take by mouth as needed.   rosuvastatin (CRESTOR) 5 MG tablet TAKE ONE TABLET BY MOUTH ONCE DAILY   scopolamine (TRANSDERM-SCOP) 1 MG/3DAYS Place 1 patch (1.5 mg total) onto the skin every 3 (three) days.   valACYclovir (VALTREX) 500 MG tablet TAKE 1 TABLET BY MOUTH TWICE A DAY AS NEEDED   No facility-administered encounter medications on file as of 05/09/2022.   Reviewed chart for medication changes.  No OVs, Consults, or hospital visits since last care coordination call/Pharmacist visit.  No medication changes indicated.  BP Readings from Last 3 Encounters:  12/24/21 (!) 150/81  10/08/21 (!) 144/80  02/04/21 (!) 152/80    Lab Results  Component Value Date   HGBA1C 5.8 06/08/2017     Patient obtains medications through Vials  90 Days   Last adherence delivery included:  Lisinopril 20 mg once daily Rosuvastatin 5 mg once daily  Patient is due for next adherence delivery on: 05/22/2022. Called patient and reviewed medications and coordinated delivery.  This delivery to include: Lisinopril 20 mg once daily Rosuvastatin 5 mg once daily  Patient needs refills for: Lisinopril 20 mg once daily Rosuvastatin 5 mg once daily -Rx refill request  sent to PCP.  Confirmed delivery date of 05/22/2022, advised patient that pharmacy will contact them the morning of delivery.   Care Gaps: Medicare Annual Wellness: Completed 01/30/2022 Hemoglobin A1C: 5.8% on 06/08/2017 Colonoscopy: Next due on 08/04/2024 Dexa Scan: Completed Mammogram: Overdue since 04/07/2021  Future Appointments  Date Time Provider [place]  05/14/2022  1:30 PM Kerry Dory, NP [facility] None  07/01/2022  2:30 PM Melrose Nakayama, MD [facility]   [name], [place] Pharmacist Assistant [phone]

## 2022-05-14 ENCOUNTER — Ambulatory Visit (INDEPENDENT_AMBULATORY_CARE_PROVIDER_SITE_OTHER): Payer: PPO | Admitting: Radiology

## 2022-05-14 ENCOUNTER — Encounter: Payer: Self-pay | Admitting: Radiology

## 2022-05-14 VITALS — BP 140/80 | Ht 64.75 in | Wt 159.0 lb

## 2022-05-14 DIAGNOSIS — N958 Other specified menopausal and perimenopausal disorders: Secondary | ICD-10-CM | POA: Diagnosis not present

## 2022-05-14 DIAGNOSIS — Z01419 Encounter for gynecological examination (general) (routine) without abnormal findings: Secondary | ICD-10-CM | POA: Diagnosis not present

## 2022-05-14 DIAGNOSIS — R32 Unspecified urinary incontinence: Secondary | ICD-10-CM

## 2022-05-14 LAB — URINALYSIS, COMPLETE W/RFL CULTURE
Bacteria, UA: NONE SEEN /HPF
Bilirubin Urine: NEGATIVE
Glucose, UA: NEGATIVE
Hgb urine dipstick: NEGATIVE
Hyaline Cast: NONE SEEN /LPF
Ketones, ur: NEGATIVE
Leukocyte Esterase: NEGATIVE
Nitrites, Initial: NEGATIVE
Protein, ur: NEGATIVE
RBC / HPF: NONE SEEN /HPF (ref 0–2)
Specific Gravity, Urine: 1.015 (ref 1.001–1.035)
WBC, UA: NONE SEEN /HPF (ref 0–5)
pH: 6.5 (ref 5.0–8.0)

## 2022-05-14 LAB — NO CULTURE INDICATED

## 2022-05-14 MED ORDER — ESTRADIOL 0.1 MG/GM VA CREA: 1.0000 g | TOPICAL_CREAM | VAGINAL | 12 refills | Status: DC

## 2022-05-14 NOTE — Progress Notes (Signed)
   [name] 1951/04/14 [id number]   History: Postmenopausal 71 y.o. presents for breast and pelvic exam. She reports leaking urine often during the day, worse with coughing/sneezing/sudden movements. Also has worsening urgency.No dysuria or nocturia.  C/O vaginal dryness and dyspareunia, uses a lubricant for intercourse which helps some.   Gynecologic History Postmenopausal Last Pap: 2020. Results were: normal Last mammogram: 2020. Results were: normal Last colonoscopy: 2015 HRT use: no  Obstetric History OB History  Gravida Para Term Preterm AB Living  '2 2 2     2  '$ SAB IAB Ectopic Multiple Live Births          2    # Outcome Date GA Lbr Len/2nd Weight Sex Delivery Anes PTL Lv  2 Term     M Vag-Spont   LIV  1 Term     F Vag-Spont   LIV     The following portions of the patient's history were reviewed and updated as appropriate: allergies, current medications, past family history, past medical history, past social history, past surgical history, and problem list.  Review of Systems Pertinent items noted in HPI and remainder of comprehensive ROS otherwise negative.  Past medical history, past surgical history, family history and social history were all reviewed and documented in the EPIC chart.  Exam:  Vitals:   05/14/22 1326  BP: 140/80  Weight: 159 lb (72.1 kg)  Height: 5' 4.75" (1.645 m)   Body mass index is 26.66 kg/m.  General appearance:  Normal Thyroid:  Symmetrical, normal in size, without palpable masses or nodularity. Respiratory  Auscultation:  Clear without wheezing or rhonchi Cardiovascular  Auscultation:  Regular rate, without rubs, murmurs or gallops  Edema/varicosities:  Not grossly evident Abdominal  Soft,nontender, without masses, guarding or rebound.  Liver/spleen:  No organomegaly noted  Hernia:  None appreciated  Skin  Inspection:  Grossly normal Breasts: Examined lying and sitting.   Right: Without masses, retractions, nipple discharge  or axillary adenopathy.   Left: Without masses, retractions, nipple discharge or axillary adenopathy. Genitourinary   Inguinal/mons:  Normal without inguinal adenopathy  External genitalia:  Normal appearing vulva with no masses, tenderness, or lesions  BUS/Urethra/Skene's glands:  Normal  Vagina:  Normal appearing with normal color and discharge, no lesions. Atrophy: moderate   Cervix:  Normal appearing without discharge or lesions  Uterus:  Normal in size, shape and contour.  Midline and mobile, nontender  Adnexa/parametria:     Rt: Normal in size, without masses or tenderness.   Lt: Normal in size, without masses or tenderness.  Anus and perineum: Normal    Patient informed chaperone available to be present for breast and pelvic exam. Patient has requested no chaperone to be present. Patient has been advised what will be completed during breast and pelvic exam.  Urine dipstick shows negative for all components.  Micro exam: negative for WBC's or RBC's.   Assessment/Plan:   1. Encounter for breast and pelvic examination  2. Urinary incontinence, unspecified type Normal u/a - Urinalysis,Complete w/RFL Culture  3. Genitourinary syndrome of menopause - estradiol (ESTRACE VAGINAL) 0.1 MG/GM vaginal cream; Place 1 g vaginally 3 (three) times a week.  Dispense: 42.5 g; Refill: 12    Discussed SBE, colonoscopy and DEXA screening as directed. Recommend 167mns of exercise weekly, including weight bearing exercise. Encouraged the use of seatbelts and sunscreen.  Return in 1 year for annual or sooner prn.  ,  B WHNP-BC, 1:51 PM 05/14/2022

## 2022-07-01 ENCOUNTER — Ambulatory Visit: Payer: PPO | Admitting: Thoracic Surgery (Cardiothoracic Vascular Surgery)

## 2022-07-01 ENCOUNTER — Encounter: Payer: Self-pay | Admitting: *Deleted

## 2022-07-01 ENCOUNTER — Other Ambulatory Visit: Payer: Self-pay | Admitting: *Deleted

## 2022-07-01 VITALS — BP 160/76 | HR 67 | Resp 20 | Ht 64.0 in | Wt 160.0 lb

## 2022-07-01 DIAGNOSIS — D171 Benign lipomatous neoplasm of skin and subcutaneous tissue of trunk: Secondary | ICD-10-CM

## 2022-07-01 NOTE — Progress Notes (Signed)
[address]       [place] [address]             [phone]     HPI: [name] returns for follow-up of a fatty left supraclavicular mass.  [name] is a 71 year old woman with a history of asthma, reflux, and anxiety.  She noticed a nodule in the left supraclavicular area several years ago.  Over time it is gotten larger.  She does experience pain from things rubbing on that area but there is no pain at baseline.  Remains very active.  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      Cyanocobalamin (VITAMIN B 12 PO) Take by mouth daily.      estradiol (ESTRACE VAGINAL) 0.1 MG/GM vaginal cream Place 1 g vaginally 3 (three) times a week. 42.5 g 12   lisinopril (ZESTRIL) 20 MG tablet TAKE ONE TABLET BY MOUTH ONCE DAILY 90 tablet 0   Multiple Vitamins-Minerals (MULTIVITAMIN PO) Take by mouth.     naproxen sodium (ALEVE) 220 MG tablet Take 220 mg by mouth as needed.     Probiotic Product (PROBIOTIC PO) Take by mouth as needed.     rosuvastatin (CRESTOR) 5 MG tablet TAKE ONE TABLET BY MOUTH ONCE DAILY 90 tablet 0   valACYclovir (VALTREX) 500 MG tablet TAKE 1 TABLET BY MOUTH  TWICE A DAY AS NEEDED 30 tablet 12   No current facility-administered medications for this visit.    Physical Exam BP (!) 160/76   Pulse 67   Resp 20   Ht '5\' 4"'$  (1.626 m)   Wt 160 lb (72.6 kg)   LMP 10/20/2000   SpO2 95% Comment: RA  BMI 27.64 kg/m  71 year old woman in no acute distress Alert and oriented x3 with no focal deficits Well-developed and well-nourished Soft mobile mass in the left supraclavicular fossa anterior to the trapezius approximately 2 x 3 cm.  Diagnostic Tests: I reviewed her CT from February 2023.  There is no well-defined mass in that region that is apparent on the CT.  Impression: [name] is a 71 year old woman with a history of asthma, reflux, and anxiety.  She has a small mass in the left supraclavicular area.  Mass was not really visible on CT scan but is noticeable on exam.  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  It is causing her discomfort and she wishes to have it removed.  I discussed excision of the left supraclavicular mass with Mrs.  Derocher.  She understands the indications, risk, benefits, and alternatives.  She understands the risks include bleeding, wound infection, seroma, nerve damage, as well as possibility of general complications such as MI, DVT, PE.  She accepts the risk and wishes to proceed but wants to wait until November to have the procedure.  Plan: Excision of probable lipoma from left supraclavicular fossa on Monday, 09/01/2022  Melrose Nakayama, MD [facility] [phone]

## 2022-07-14 ENCOUNTER — Encounter: Payer: Self-pay | Admitting: *Deleted

## 2022-08-07 ENCOUNTER — Ambulatory Visit (INDEPENDENT_AMBULATORY_CARE_PROVIDER_SITE_OTHER): Payer: PPO

## 2022-08-07 DIAGNOSIS — Z23 Encounter for immunization: Secondary | ICD-10-CM

## 2022-08-17 ENCOUNTER — Other Ambulatory Visit: Payer: Self-pay | Admitting: Physician Assistant

## 2022-08-27 NOTE — Pre-Procedure Instructions (Signed)
Surgical Instructions    Your procedure is scheduled on Monday 09/01/22.   Report to [facility] Main Entrance "A" at 05:30 A.M., then check in with the Admitting office.  Call this number if you have problems the morning of surgery:  [phone]   If you have any questions prior to your surgery date call [phone]: Open Monday-Friday 8am-4pm If you experience any cold or flu symptoms such as cough, fever, chills, shortness of breath, etc. between now and your scheduled surgery, please notify us at the above number     Remember:  Do not eat or drink after midnight the night before your surgery     Take these medicines the morning of surgery with A SIP OF WATER:   fluticasone (FLONASE)   rosuvastatin (CRESTOR)    As of today, STOP taking any Aspirin (unless otherwise instructed by your surgeon) Aleve, Naproxen, Ibuprofen, Motrin, Advil, Goody's, BC's, all herbal medications, fish oil, and all vitamins.           Do not wear jewelry or makeup. Do not wear lotions, powders, perfumes/cologne or deodorant. Do not shave 48 hours prior to surgery.  Men may shave face and neck. Do not bring valuables to the hospital. Do not wear nail polish, gel polish, artificial nails, or any other type of covering on natural nails (fingers and toes) If you have artificial nails or gel coating that need to be removed by a nail salon, please have this removed prior to surgery. Artificial nails or gel coating may interfere with anesthesia's ability to adequately monitor your vital signs.  [facility] is not responsible for any belongings or valuables.    Do NOT Smoke (Tobacco/Vaping)  24 hours prior to your procedure  If you use a CPAP at night, you may bring your mask for your overnight stay.   Contacts, glasses, hearing aids, dentures or partials may not be worn into surgery, please bring cases for these belongings   For patients admitted to the hospital, discharge time will be determined by your  treatment team.   Patients discharged the day of surgery will not be allowed to drive home, and someone needs to stay with them for 24 hours.   SURGICAL WAITING ROOM VISITATION Patients having surgery or a procedure may have no more than 2 support people in the waiting area - these visitors may rotate.   Children under the age of 53 must have an adult with them who is not the patient. If the patient needs to stay at the hospital during part of their recovery, the visitor guidelines for inpatient rooms apply. Pre-op nurse will coordinate an appropriate time for 1 support person to accompany patient in pre-op.  This support person may not rotate.   Please refer to [email or web address] for the visitor guidelines for Inpatients (after your surgery is over and you are in a regular room).    Special instructions:    Oral Hygiene is also important to reduce your risk of infection.  Remember - BRUSH YOUR TEETH THE MORNING OF SURGERY WITH YOUR REGULAR TOOTHPASTE   - Preparing For Surgery  Before surgery, you can play an important role. Because skin is not sterile, your skin needs to be as free of germs as possible. You can reduce the number of germs on your skin by washing with CHG (chlorahexidine gluconate) Soap before surgery.  CHG is an antiseptic cleaner which kills germs and bonds with the skin to continue killing germs even after washing.  Please do not use if you have an allergy to CHG or antibacterial soaps. If your skin becomes reddened/irritated stop using the CHG.  Do not shave (including legs and underarms) for at least 48 hours prior to first CHG shower. It is OK to shave your face.  Please follow these instructions carefully.     Shower the NIGHT BEFORE SURGERY and the MORNING OF SURGERY with CHG Soap.   If you chose to wash your hair, wash your hair first as usual with your normal shampoo. After you shampoo, rinse  your hair and body thoroughly to remove the shampoo.  Then [employer] and genitals (private parts) with your normal soap and rinse thoroughly to remove soap.  After that Use CHG Soap as you would any other liquid soap. You can apply CHG directly to the skin and wash gently with a scrungie or a clean washcloth.   Apply the CHG Soap to your body ONLY FROM THE NECK DOWN.  Do not use on open wounds or open sores. Avoid contact with your eyes, ears, mouth and genitals (private parts). Wash Face and genitals (private parts)  with your normal soap.   Wash thoroughly, paying special attention to the area where your surgery will be performed.  Thoroughly rinse your body with warm water from the neck down.  DO NOT shower/wash with your normal soap after using and rinsing off the CHG Soap.  Pat yourself dry with a CLEAN TOWEL.  Wear CLEAN PAJAMAS to bed the night before surgery  Place CLEAN SHEETS on your bed the night before your surgery  DO NOT SLEEP WITH PETS.   Day of Surgery:  Take a shower with CHG soap. Wear Clean/Comfortable clothing the morning of surgery Do not apply any deodorants/lotions.   Remember to brush your teeth WITH YOUR REGULAR TOOTHPASTE.    If you received a COVID test during your pre-op visit, it is requested that you wear a mask when out in public, stay away from anyone that may not be feeling well, and notify your surgeon if you develop symptoms. If you have been in contact with anyone that has tested positive in the last 10 days, please notify your surgeon.    Please read over the following fact sheets that you were given.

## 2022-08-28 ENCOUNTER — Other Ambulatory Visit: Payer: Self-pay

## 2022-08-28 ENCOUNTER — Ambulatory Visit (HOSPITAL_COMMUNITY)
Admission: RE | Admit: 2022-08-28 | Discharge: 2022-08-28 | Disposition: A | Discharge status: Home / Self Care | Payer: PPO | Source: Ambulatory Visit | Attending: Thoracic Surgery (Cardiothoracic Vascular Surgery) | Admitting: Thoracic Surgery (Cardiothoracic Vascular Surgery)

## 2022-08-28 ENCOUNTER — Encounter (HOSPITAL_COMMUNITY)
Admission: RE | Admit: 2022-08-28 | Discharge: 2022-08-28 | Disposition: A | Discharge status: Home / Self Care | Payer: PPO | Source: Ambulatory Visit | Attending: Thoracic Surgery (Cardiothoracic Vascular Surgery) | Admitting: Thoracic Surgery (Cardiothoracic Vascular Surgery)

## 2022-08-28 ENCOUNTER — Encounter (HOSPITAL_COMMUNITY): Payer: Self-pay

## 2022-08-28 VITALS — BP 160/70 | HR 60 | Temp 97.7°F | Resp 17 | Ht 65.0 in | Wt 161.3 lb

## 2022-08-28 DIAGNOSIS — Z1152 Encounter for screening for COVID-19: Secondary | ICD-10-CM | POA: Diagnosis not present

## 2022-08-28 DIAGNOSIS — Z01818 Encounter for other preprocedural examination: Secondary | ICD-10-CM | POA: Insufficient documentation

## 2022-08-28 DIAGNOSIS — D171 Benign lipomatous neoplasm of skin and subcutaneous tissue of trunk: Secondary | ICD-10-CM | POA: Diagnosis not present

## 2022-08-28 HISTORY — DX: Other specified postprocedural states: Z98.890

## 2022-08-28 HISTORY — DX: Nausea with vomiting, unspecified: R11.2

## 2022-08-28 HISTORY — DX: Other complications of anesthesia, initial encounter: T88.59XA

## 2022-08-28 HISTORY — DX: Unspecified osteoarthritis, unspecified site: M19.90

## 2022-08-28 HISTORY — DX: Essential (primary) hypertension: I10

## 2022-08-28 LAB — COMPREHENSIVE METABOLIC PANEL
ALT: 27 U/L (ref 0–44)
AST: 33 U/L (ref 15–41)
Albumin: 3.9 g/dL (ref 3.5–5.0)
Alkaline Phosphatase: 70 U/L (ref 38–126)
Anion gap: 8 (ref 5–15)
BUN: 15 mg/dL (ref 8–23)
CO2: 24 mmol/L (ref 22–32)
Calcium: 9.9 mg/dL (ref 8.9–10.3)
Chloride: 105 mmol/L (ref 98–111)
Creatinine, Ser: 0.74 mg/dL (ref 0.44–1.00)
GFR, Estimated: >60 mL/min (ref >60)
Glucose, Bld: 107 mg/dL — ABNORMAL HIGH (ref 70–99)
Potassium: 5.1 mmol/L (ref 3.5–5.1)
Sodium: 137 mmol/L (ref 135–145)
Total Bilirubin: 0.9 mg/dL (ref 0.3–1.2)
Total Protein: 6.3 g/dL — ABNORMAL LOW (ref 6.5–8.1)

## 2022-08-28 LAB — SURGICAL PCR SCREEN
MRSA, PCR: NEGATIVE
Staphylococcus aureus: NEGATIVE

## 2022-08-28 LAB — CBC
HCT: 41.3 % (ref 36.0–46.0)
Hemoglobin: 13.5 g/dL (ref 12.0–15.0)
MCH: 32.5 pg (ref 26.0–34.0)
MCHC: 32.7 g/dL (ref 30.0–36.0)
MCV: 99.3 fL (ref 80.0–100.0)
Platelets: 236 10*3/uL (ref 150–400)
RBC: 4.16 MIL/uL (ref 3.87–5.11)
RDW: 12.5 % (ref 11.5–15.5)
WBC: 7.6 10*3/uL (ref 4.0–10.5)
nRBC: 0 % (ref 0.0–0.2)

## 2022-08-28 LAB — TYPE AND SCREEN
ABO/RH(D): O POS
Antibody Screen: NEGATIVE

## 2022-08-28 LAB — SARS CORONAVIRUS 2 (TAT 6-24 HRS): SARS Coronavirus 2: NEGATIVE

## 2022-08-28 LAB — PROTIME-INR
INR: 1 (ref 0.8–1.2)
Prothrombin Time: 12.8 seconds (ref 11.4–15.2)

## 2022-08-28 LAB — APTT: aPTT: 22 seconds — ABNORMAL LOW (ref 24–36)

## 2022-08-28 NOTE — Progress Notes (Signed)
PCP - Theresa Duty, PA at [facility] - denies  PPM/ICD - n/a Device Orders - n/a Rep Notified - n/a  Chest x-ray - 08/28/22 EKG - 08/28/22 Stress Test - denies ECHO - denies Cardiac Cath - denies  Sleep Study - denies CPAP - n/a  Fasting Blood Sugar - n/a Checks Blood Sugar _____ times a day- n/a  Last dose of GLP1 agonist-  n/a GLP1 instructions: n/a  Blood Thinner Instructions: n/a Aspirin Instructions: n/a  ERAS Protcol - NPO  COVID TEST- 08/28/22. Pending.    Anesthesia review: No  Patient denies shortness of breath, fever, cough and chest pain at PAT appointment   All instructions explained to the patient, with a verbal understanding of the material. Patient agrees to go over the instructions while at home for a better understanding. The opportunity to ask questions was provided.

## 2022-08-31 NOTE — Anesthesia Preprocedure Evaluation (Signed)
Anesthesia Evaluation  Patient identified by MRN, date of birth, ID band Patient awake    Reviewed: Allergy & Precautions, H&P , NPO status , Patient's Chart, lab work & pertinent test results  History of Anesthesia Complications (+) PONV and history of anesthetic complications  Airway Mallampati: II  TM Distance: >3 FB Neck ROM: Full    Dental no notable dental hx. (+) Teeth Intact, Dental Advisory Given   Pulmonary asthma    Pulmonary exam normal breath sounds clear to auscultation       Cardiovascular Exercise Tolerance: Good hypertension, Pt. on medications  Rhythm:Regular Rate:Normal     Neuro/Psych   Anxiety     negative neurological ROS     GI/Hepatic Neg liver ROS, hiatal hernia,GERD  Controlled,,  Endo/Other  negative endocrine ROS    Renal/GU negative Renal ROS  negative genitourinary   Musculoskeletal  (+) Arthritis , Osteoarthritis,    Abdominal   Peds  Hematology negative hematology ROS (+)   Anesthesia Other Findings   Reproductive/Obstetrics negative OB ROS                             Anesthesia Physical Anesthesia Plan  ASA: 2  Anesthesia Plan: General   Post-op Pain Management: Tylenol PO (pre-op)*   Induction: Intravenous  PONV Risk Score and Plan: 4 or greater and Ondansetron, Dexamethasone, Propofol infusion and TIVA  Airway Management Planned: Oral ETT  Additional Equipment:   Intra-op Plan:   Post-operative Plan: Extubation in OR  Informed Consent: I have reviewed the patients History and Physical, chart, labs and discussed the procedure including the risks, benefits and alternatives for the proposed anesthesia with the patient or authorized representative who has indicated his/her understanding and acceptance.     Dental advisory given  Plan Discussed with: CRNA  Anesthesia Plan Comments:        Anesthesia Quick Evaluation

## 2022-09-01 ENCOUNTER — Ambulatory Visit (HOSPITAL_BASED_OUTPATIENT_CLINIC_OR_DEPARTMENT_OTHER): Payer: PPO | Admitting: Anesthesiology

## 2022-09-01 ENCOUNTER — Encounter (HOSPITAL_COMMUNITY)
Admission: RE | Disposition: A | Discharge status: Home / Self Care | Payer: Self-pay | Source: Home / Self Care | Attending: Thoracic Surgery (Cardiothoracic Vascular Surgery)

## 2022-09-01 ENCOUNTER — Encounter (HOSPITAL_COMMUNITY): Payer: Self-pay | Admitting: Thoracic Surgery (Cardiothoracic Vascular Surgery)

## 2022-09-01 ENCOUNTER — Ambulatory Visit (HOSPITAL_COMMUNITY): Payer: PPO | Admitting: Anesthesiology

## 2022-09-01 ENCOUNTER — Other Ambulatory Visit: Payer: Self-pay

## 2022-09-01 ENCOUNTER — Ambulatory Visit (HOSPITAL_COMMUNITY)
Admission: RE | Admit: 2022-09-01 | Discharge: 2022-09-01 | Disposition: A | Discharge status: Home / Self Care | Payer: PPO | Attending: Thoracic Surgery (Cardiothoracic Vascular Surgery) | Admitting: Thoracic Surgery (Cardiothoracic Vascular Surgery)

## 2022-09-01 DIAGNOSIS — D171 Benign lipomatous neoplasm of skin and subcutaneous tissue of trunk: Secondary | ICD-10-CM

## 2022-09-01 DIAGNOSIS — D170 Benign lipomatous neoplasm of skin and subcutaneous tissue of head, face and neck: Secondary | ICD-10-CM | POA: Insufficient documentation

## 2022-09-01 DIAGNOSIS — I10 Essential (primary) hypertension: Secondary | ICD-10-CM | POA: Insufficient documentation

## 2022-09-01 HISTORY — PX: SUPRACLAVICAL NODE BIOPSY: SHX5165

## 2022-09-01 LAB — ABO/RH: ABO/RH(D): O POS

## 2022-09-01 SURGERY — BIOPSY, LYMPH NODE, SUPRACLAVICULAR
Anesthesia: General | Laterality: Left

## 2022-09-01 MED ORDER — ORAL CARE MOUTH RINSE: 15.0000 mL | Freq: Once | OROMUCOSAL | Status: AC

## 2022-09-01 MED ORDER — OXYCODONE HCL 5 MG PO TABS: 5.0000 mg | ORAL_TABLET | Freq: Four times a day (QID) | ORAL | Status: DC | PRN

## 2022-09-01 MED ORDER — VANCOMYCIN HCL IN DEXTROSE 1-5 GM/200ML-% IV SOLN
1000.0000 mg | INTRAVENOUS | Status: AC
  Administered 2022-09-01: 1000 mg via INTRAVENOUS
  Filled 2022-09-01: qty 200

## 2022-09-01 MED ORDER — LIDOCAINE 2% (20 MG/ML) 5 ML SYRINGE
INTRAMUSCULAR | Status: DC | PRN
  Administered 2022-09-01: 60 mg via INTRAVENOUS

## 2022-09-01 MED ORDER — FENTANYL CITRATE (PF) 250 MCG/5ML IJ SOLN
INTRAMUSCULAR | Status: AC
  Filled 2022-09-01: qty 5

## 2022-09-01 MED ORDER — FENTANYL CITRATE (PF) 250 MCG/5ML IJ SOLN
INTRAMUSCULAR | Status: DC | PRN
  Administered 2022-09-01 (×4): 50 ug via INTRAVENOUS

## 2022-09-01 MED ORDER — ROCURONIUM BROMIDE 10 MG/ML (PF) SYRINGE
PREFILLED_SYRINGE | INTRAVENOUS | Status: DC | PRN
  Administered 2022-09-01: 40 mg via INTRAVENOUS

## 2022-09-01 MED ORDER — PROPOFOL 10 MG/ML IV BOLUS
INTRAVENOUS | Status: AC
  Filled 2022-09-01: qty 20

## 2022-09-01 MED ORDER — PROPOFOL 10 MG/ML IV BOLUS
INTRAVENOUS | Status: DC | PRN
  Administered 2022-09-01 (×2): 20 mg via INTRAVENOUS
  Administered 2022-09-01: 50 mg via INTRAVENOUS
  Administered 2022-09-01: 100 mg via INTRAVENOUS

## 2022-09-01 MED ORDER — SUGAMMADEX SODIUM 200 MG/2ML IV SOLN
INTRAVENOUS | Status: DC | PRN
  Administered 2022-09-01: 200 mg via INTRAVENOUS

## 2022-09-01 MED ORDER — 0.9 % SODIUM CHLORIDE (POUR BTL) OPTIME
TOPICAL | Status: DC | PRN
  Administered 2022-09-01: 1000 mL

## 2022-09-01 MED ORDER — LACTATED RINGERS IV SOLN: INTRAVENOUS | Status: DC

## 2022-09-01 MED ORDER — ACETAMINOPHEN 500 MG PO TABS
1000.0000 mg | ORAL_TABLET | Freq: Once | ORAL | Status: AC
  Administered 2022-09-01: 1000 mg via ORAL
  Filled 2022-09-01: qty 2

## 2022-09-01 MED ORDER — BUPIVACAINE HCL (PF) 0.5 % IJ SOLN
INTRAMUSCULAR | Status: DC | PRN
  Administered 2022-09-01: 10 mL

## 2022-09-01 MED ORDER — BUPIVACAINE HCL (PF) 0.5 % IJ SOLN
INTRAMUSCULAR | Status: AC
  Filled 2022-09-01: qty 30

## 2022-09-01 MED ORDER — DEXAMETHASONE SODIUM PHOSPHATE 10 MG/ML IJ SOLN
INTRAMUSCULAR | Status: DC | PRN
  Administered 2022-09-01: 4 mg via INTRAVENOUS

## 2022-09-01 MED ORDER — CHLORHEXIDINE GLUCONATE 0.12 % MT SOLN
15.0000 mL | Freq: Once | OROMUCOSAL | Status: AC
  Administered 2022-09-01: 15 mL via OROMUCOSAL
  Filled 2022-09-01: qty 15

## 2022-09-01 MED ORDER — ONDANSETRON HCL 4 MG/2ML IJ SOLN
INTRAMUSCULAR | Status: DC | PRN
  Administered 2022-09-01: 4 mg via INTRAVENOUS

## 2022-09-01 MED ORDER — OXYCODONE HCL 5 MG PO TABS: 5.0000 mg | ORAL_TABLET | Freq: Four times a day (QID) | ORAL | 0 refills | Status: DC | PRN

## 2022-09-01 MED ORDER — LIDOCAINE-EPINEPHRINE (PF) 1 %-1:200000 IJ SOLN
INTRAMUSCULAR | Status: AC
  Filled 2022-09-01: qty 30

## 2022-09-01 MED ORDER — PROPOFOL 500 MG/50ML IV EMUL
INTRAVENOUS | Status: DC | PRN
  Administered 2022-09-01: 125 ug/kg/min via INTRAVENOUS

## 2022-09-01 SURGICAL SUPPLY — 35 items
BLADE CLIPPER SURG (BLADE) ×1 IMPLANT
CANISTER SUCT 3000ML PPV (MISCELLANEOUS) ×1 IMPLANT
CLIP TI WIDE RED SMALL 6 (CLIP) IMPLANT
CNTNR URN SCR LID CUP LEK RST (MISCELLANEOUS) ×2 IMPLANT
CONT SPEC 4OZ STRL OR WHT (MISCELLANEOUS) ×2
DERMABOND ADVANCED .7 DNX12 (GAUZE/BANDAGES/DRESSINGS) ×1 IMPLANT
DRAPE CHEST BREAST 15X10 FENES (DRAPES) ×1 IMPLANT
ELECT CAUTERY BLADE 6.4 (BLADE) ×1 IMPLANT
ELECT REM PT RETURN 9FT ADLT (ELECTROSURGICAL) ×1
ELECTRODE REM PT RTRN 9FT ADLT (ELECTROSURGICAL) ×1 IMPLANT
GAUZE 4X4 16PLY ~~LOC~~+RFID DBL (SPONGE) ×1 IMPLANT
GAUZE SPONGE 4X4 12PLY STRL (GAUZE/BANDAGES/DRESSINGS) ×1 IMPLANT
GLOVE SS BIOGEL STRL SZ 7.5 (GLOVE) ×1 IMPLANT
GLOVE SURG SIGNA 7.5 PF LTX (GLOVE) ×1 IMPLANT
GOWN STRL REUS W/ TWL XL LVL3 (GOWN DISPOSABLE) ×1 IMPLANT
GOWN STRL REUS W/TWL XL LVL3 (GOWN DISPOSABLE) ×1
HEMOSTAT SURGICEL 2X14 (HEMOSTASIS) IMPLANT
KIT BASIN OR (CUSTOM PROCEDURE TRAY) ×1 IMPLANT
KIT TURNOVER KIT B (KITS) ×1 IMPLANT
NS IRRIG 1000ML POUR BTL (IV SOLUTION) ×1 IMPLANT
PACK GENERAL/GYN (CUSTOM PROCEDURE TRAY) ×1 IMPLANT
PAD ARMBOARD 7.5X6 YLW CONV (MISCELLANEOUS) ×2 IMPLANT
SPONGE INTESTINAL PEANUT (DISPOSABLE) ×1 IMPLANT
SUT SILK 2 0 (SUTURE) ×1
SUT SILK 2-0 18XBRD TIE 12 (SUTURE) ×1 IMPLANT
SUT VIC AB 2-0 CT1 27 (SUTURE)
SUT VIC AB 2-0 CT1 TAPERPNT 27 (SUTURE) IMPLANT
SUT VIC AB 3-0 SH 27 (SUTURE) ×1
SUT VIC AB 3-0 SH 27X BRD (SUTURE) ×1 IMPLANT
SUT VIC AB 3-0 X1 27 (SUTURE) IMPLANT
SUT VICRYL 4-0 PS2 18IN ABS (SUTURE) ×1 IMPLANT
SYR CONTROL 10ML LL (SYRINGE) IMPLANT
TOWEL GREEN STERILE (TOWEL DISPOSABLE) ×1 IMPLANT
TOWEL GREEN STERILE FF (TOWEL DISPOSABLE) ×1 IMPLANT
WATER STERILE IRR 1000ML POUR (IV SOLUTION) ×1 IMPLANT

## 2022-09-01 NOTE — Anesthesia Procedure Notes (Signed)
Procedure Name: Intubation Date/Time: 09/01/2022 7:44 AM  Performed by: Janace Litten, CRNAPre-anesthesia Checklist: Patient identified, Emergency Drugs available, Suction available and Patient being monitored Patient Re-evaluated:Patient Re-evaluated prior to induction Oxygen Delivery Method: Circle System Utilized Preoxygenation: Pre-oxygenation with 100% oxygen Induction Type: IV induction Ventilation: Mask ventilation without difficulty Laryngoscope Size: Mac and 3 Grade View: Grade I Tube type: Oral Tube size: 7.0 mm Number of attempts: 1 Airway Equipment and Method: Stylet Placement Confirmation: ETT inserted through vocal cords under direct vision, positive ETCO2 and breath sounds checked- equal and bilateral Secured at: 21 cm Tube secured with: Tape Dental Injury: Teeth and Oropharynx as per pre-operative assessment

## 2022-09-01 NOTE — Brief Op Note (Signed)
09/01/2022  8:43 AM  PATIENT:  [name]  71 y.o. female  PRE-OPERATIVE DIAGNOSIS:  LEFT SUPRACLAVICULAR LIPOMA  POST-OPERATIVE DIAGNOSIS:  LEFT SUPRACLAVICULAR LIPOMA  PROCEDURE:  Procedure(s): EXCISION LIPOMA LEFT SUPRACLAVICAL FOSSA (Left)  SURGEON:  Surgeon(s) and Role:    * Melrose Nakayama, MD - Primary  PHYSICIAN ASSISTANT:   ASSISTANTS: none   ANESTHESIA:   local and general  EBL:  Minimal   BLOOD ADMINISTERED:none  DRAINS: none   LOCAL MEDICATIONS USED:  MARCAINE  10 ml  SPECIMEN:  Source of Specimen:  lipoma  DISPOSITION OF SPECIMEN:  PATHOLOGY  COUNTS:  YES  TOURNIQUET:  * No tourniquets in log *  DICTATION: .Other Dictation: Dictation Number -  PLAN OF CARE: Discharge to home after PACU  PATIENT DISPOSITION:  PACU - hemodynamically stable.   Delay start of Pharmacological VTE agent (>24hrs) due to surgical blood loss or risk of bleeding: not applicable

## 2022-09-01 NOTE — H&P (Signed)
HPI: [name] returns for follow-up of a fatty left supraclavicular mass.   [name] is a 71 year old woman with a history of asthma, reflux, and anxiety.  She noticed a nodule in the left supraclavicular area several years ago.  Over time it is gotten larger.  She does experience pain from things rubbing on that area but there is no pain at baseline.  Remains very active.       Past Medical History:  Diagnosis Date   Acute meniscal tear of left knee      treated by Dr Katha Hamming date pending   Asthma      as a child   Baker's cyst of knee      left knee-treated by Dr French Ana   GAD (generalized anxiety disorder) 03/12/2015    -with rare panic disorder related to caring for grandaughter    GERD (gastroesophageal reflux disease)     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   Seasonal allergies     Spider veins 11/23/2013         Past Surgical History:  Procedure Laterality Date   BREAST BIOPSY        left   BREAST LUMPECTOMY   8/99    left breast- benign (lipoma)   COLONOSCOPY   2005    Dr Lyla Son   DENTAL SURGERY        Cyst on right bottom tooth   TOE SURGERY                Current Outpatient Medications  Medication Sig Dispense Refill   calcium-vitamin D (OSCAL WITH D) 250-125 MG-UNIT per tablet Take 1 tablet by mouth as needed.        Cyanocobalamin (VITAMIN B 12 PO) Take by mouth daily.        estradiol (ESTRACE VAGINAL) 0.1 MG/GM vaginal cream Place 1 g vaginally 3 (three) times a week. 42.5 g 12   lisinopril (ZESTRIL) 20 MG tablet TAKE ONE TABLET BY MOUTH ONCE DAILY 90 tablet 0   Multiple Vitamins-Minerals (MULTIVITAMIN PO) Take by mouth.       naproxen sodium (ALEVE) 220 MG tablet Take 220 mg by mouth as needed.       Probiotic Product (PROBIOTIC PO) Take by mouth as needed.       rosuvastatin (CRESTOR) 5 MG tablet TAKE ONE TABLET BY MOUTH ONCE DAILY 90 tablet 0   valACYclovir (VALTREX) 500 MG tablet TAKE 1 TABLET BY MOUTH TWICE A DAY AS NEEDED 30 tablet 12    No  current facility-administered medications for this visit.      Physical Exam BP (!) 160/76   Pulse 67   Resp 20   Ht '5\' 4"'$  (1.626 m)   Wt 160 lb (72.6 kg)   LMP 10/20/2000   SpO2 95% Comment: RA  BMI 27.76 kg/m  71 year old woman in no acute distress Alert and oriented x3 with no focal deficits Well-developed and well-nourished Soft mobile mass in the left supraclavicular fossa anterior to the trapezius approximately 2 x 3 cm.   Diagnostic Tests: I reviewed her CT from February 2023.  There is no well-defined mass in that region that is apparent on the CT.   Impression: [name] is a 71 year old woman with a history of asthma, reflux, and anxiety.  She has a small mass in the left supraclavicular area.   Mass was not really visible on CT scan but is noticeable on exam.  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  It is causing her discomfort and she wishes to have it removed.   I discussed excision of the left supraclavicular mass with [name].  She understands the indications, risk, benefits, and alternatives.  She understands the risks include bleeding, wound infection, seroma, nerve damage, as well as possibility of general complications such as MI, DVT, PE.   She accepts the risk and wishes to proceed but wants to wait until November to have the procedure.   Plan: Excision of probable lipoma from left supraclavicular fossa on Monday, 09/01/2022   Melrose Nakayama, MD [facility] [phone]                Electronically signed by Melrose Nakayama, MD at 07/01/2022  2:51 PM  No interval change  [name]. Erin Hockey, MD [facility] [phone]

## 2022-09-01 NOTE — Transfer of Care (Signed)
Immediate Anesthesia Transfer of Care Note  Patient: [name]  Procedure(s) Performed: EXCISION LIPOMA LEFT SUPRACLAVICAL FOSSA (Left)  Patient Location: PACU  Anesthesia Type:General  Level of Consciousness: drowsy, patient cooperative, and responds to stimulation  Airway & Oxygen Therapy: Patient Spontanous Breathing  Post-op Assessment: Report given to RN and Post -op Vital signs reviewed and stable  Post vital signs: Reviewed and stable  Last Vitals:  Vitals Value Taken Time  BP 127/80 09/01/22 0846  Temp 36.7 C 09/01/22 0845  Pulse 69 09/01/22 0853  Resp 25 09/01/22 0853  SpO2 96 % 09/01/22 0853  Vitals shown include unvalidated device data.  Last Pain:  Vitals:   09/01/22 0845  TempSrc:   PainSc: 0-No pain         Complications: No notable events documented.

## 2022-09-01 NOTE — Discharge Instructions (Addendum)
Do not drive or engage in heavy physical activity for 24 hours.  You may resume driving when no longer taking oxycodone for pain.  Wait 2 weeks before playing golf.  You have a prescription for oxycodone, a narcotic pain reliever.  You may use as directed.  You may use naproxen and/or acetaminophen (tylenol) in addition to or instead of the oxycodone.  You may apply an ice pack to the incision for 20 minutes at a time 4 times a day.  You may shower tomorrow.  Call [phone] if you expereince excessive pain, redness, swelling, drainage from the incision or a temperature > 101 F  My office will contact you with follow up information.

## 2022-09-01 NOTE — Interval H&P Note (Signed)
History and Physical Interval Note:  09/01/2022 7:21 AM  [name]  has presented today for surgery, with the diagnosis of LEFT SUPRACLAVICULAR LIPOMA.  The various methods of treatment have been discussed with the patient and family. After consideration of risks, benefits and other options for treatment, the patient has consented to  Procedure(s): EXCISION LIPOMA LEFT SUPRACLAVICAL FOSSA (Left) as a surgical intervention.  The patient's history has been reviewed, patient examined, no change in status, stable for surgery.  I have reviewed the patient's chart and labs.  Questions were answered to the patient's satisfaction.     Melrose Nakayama

## 2022-09-01 NOTE — Anesthesia Postprocedure Evaluation (Signed)
Anesthesia Post Note  Patient: Neurosurgeon  Procedure(s) Performed: EXCISION LIPOMA LEFT SUPRACLAVICAL FOSSA (Left)     Patient location during evaluation: PACU Anesthesia Type: General Level of consciousness: awake and alert Pain management: pain level controlled Vital Signs Assessment: post-procedure vital signs reviewed and stable Respiratory status: spontaneous breathing, nonlabored ventilation and respiratory function stable Cardiovascular status: blood pressure returned to baseline and stable Postop Assessment: no apparent nausea or vomiting Anesthetic complications: no  No notable events documented.  Last Vitals:  Vitals:   09/01/22 0845 09/01/22 0900  BP: 127/80 (!) 144/82  Pulse: 64 65  Resp: 12 18  Temp: 36.7 C   SpO2: 96% 97%    Last Pain:  Vitals:   09/01/22 0900  TempSrc:   PainSc: 0-No pain                 ,W. EDMOND

## 2022-09-02 ENCOUNTER — Encounter (HOSPITAL_COMMUNITY): Payer: Self-pay | Admitting: Thoracic Surgery (Cardiothoracic Vascular Surgery)

## 2022-09-02 LAB — SURGICAL PATHOLOGY

## 2022-09-02 NOTE — Op Note (Signed)
NAME: [name], [name] MEDICAL RECORD NO: [id number] ACCOUNT NO: [id number] DATE OF BIRTH: 06/28/1951 FACILITY: [facility] LOCATION: [facility] PHYSICIAN: Revonda Standard. Roxan Hockey, MD  Operative Report   DATE OF PROCEDURE: 09/01/2022   PREOPERATIVE DIAGNOSIS:  Probable lipoma left supraclavicular fossa.  POSTOPERATIVE DIAGNOSIS:  Probable lipoma left supraclavicular fossa.  PROCEDURE:  Excision of lipoma from the left supraclavicular fossa.  SURGEON:  Revonda Standard. Roxan Hockey, MD  ASSISTANT:  None.  ANESTHESIA:  General and local with 10 mL of 0.5% bupivacaine.  FINDINGS: Well encapsulated fatty tumor.  CLINICAL NOTE:  [name] is a 71 year old woman who noticed a nodule in the left supraclavicular fossa which had grown larger over time.  It was causing her discomfort and she wished to have it removed.  The indications, risks, benefits, and alternatives were  discussed in detail with the patient.  She understood and accepted the risks and agreed to proceed.  DESCRIPTION OF PROCEDURE:  The patient was brought to the operating room on 09/01/2022.  She was given intravenous antibiotics.  She had induction of general anesthesia and was intubated.  The left neck and supraclavicular area were prepped and draped in  the usual sterile fashion.  A timeout was performed.  10 mL of 0.5% bupivacaine was injected into the skin and to the tissues in the area of the lipoma.  An incision was made and was carried through the skin and subcutaneous tissue.  Initial hemostasis was achieved with cautery.   The mass was well encapsulated.  It was dissected out using electrocautery.  There were some small vessels entering the mass that were clipped and divided.  The mass was removed and sent to pathology for permanent only.  The wound was irrigated.  There  was good hemostasis.  The wound was closed with a 3-0 Vicryl subcutaneous suture and a 4-0 Vicryl subcuticular suture.  Dermabond was applied.  The patient was  extubated in the operating room and taken to the postanesthetic care unit in good condition.   Counts were correct at the end of the procedure.     SUJ D: 09/01/2022 5:16:41 pm T: 09/02/2022 12:39:00 am  JOB: [id number]/ [id number]

## 2022-09-08 ENCOUNTER — Other Ambulatory Visit: Payer: Self-pay | Admitting: Thoracic Surgery (Cardiothoracic Vascular Surgery)

## 2022-09-08 DIAGNOSIS — D179 Benign lipomatous neoplasm, unspecified: Secondary | ICD-10-CM

## 2022-09-09 ENCOUNTER — Ambulatory Visit (INDEPENDENT_AMBULATORY_CARE_PROVIDER_SITE_OTHER): Payer: PPO | Admitting: Thoracic Surgery (Cardiothoracic Vascular Surgery)

## 2022-09-09 ENCOUNTER — Encounter: Payer: Self-pay | Admitting: Thoracic Surgery (Cardiothoracic Vascular Surgery)

## 2022-09-09 ENCOUNTER — Ambulatory Visit
Admission: RE | Admit: 2022-09-09 | Discharge: 2022-09-09 | Disposition: A | Discharge status: Home / Self Care | Payer: PPO | Source: Ambulatory Visit | Attending: Thoracic Surgery (Cardiothoracic Vascular Surgery) | Admitting: Thoracic Surgery (Cardiothoracic Vascular Surgery)

## 2022-09-09 VITALS — BP 170/77 | HR 60 | Resp 20 | Ht 65.0 in | Wt 162.0 lb

## 2022-09-09 DIAGNOSIS — D171 Benign lipomatous neoplasm of skin and subcutaneous tissue of trunk: Secondary | ICD-10-CM

## 2022-09-09 DIAGNOSIS — D179 Benign lipomatous neoplasm, unspecified: Secondary | ICD-10-CM

## 2022-09-09 NOTE — Progress Notes (Signed)
      [phone]       [place] [address]             [phone]      HPI: Mrs. Pendley returns for a scheduled follow-up after resection of the lipoma.  Samaira Holzworth is a 71 year old woman who developed a fatty tumor in her left supraclavicular region.  Over time this increased in size.  I did a resection on 09/01/2022.  She went home the same day.  Physical Exam BP (!) 170/77 (BP Location: Left Arm, Patient Position: Sitting, Cuff Size: Normal)   Pulse 60   Resp 20   Ht '5\' 5"'$  (1.651 m)   Wt 162 lb (73.5 kg)   LMP 10/20/2000   SpO2 99% Comment: RA  BMI 26.96 kg/m  Wound clean dry and intact  Diagnostic Tests: FINAL MICROSCOPIC DIAGNOSIS:   A. SOFT TISSUE MASS, LEFT SUPRACLAVICULAR, EXCISION:  - Mature adipose tissue consistent with lipoma.    Impression: Lorrin Bodner is a 71 year old woman who had a resection of a lipoma from the left supraclavicular region about 8 days ago.  She is doing well with no pain and is anxious to resume activities.  I advised her not to submerge the wound in water for at least another couple of weeks.  Also advised her to wait about a month before playing golf.  Otherwise her activities are unrestricted.  Plan: Her CT showed some thoracic aortic atherosclerosis and small bulge in the proximal descending aorta.  She is anxious about that so we will plan to scan her again in a year.  Melrose Nakayama, MD [facility] [phone]

## 2022-10-08 ENCOUNTER — Encounter: Payer: Self-pay | Admitting: Physician Assistant

## 2022-10-09 NOTE — Telephone Encounter (Signed)
Please see pt msg and advise 

## 2022-10-15 DIAGNOSIS — H43812 Vitreous degeneration, left eye: Secondary | ICD-10-CM | POA: Diagnosis not present

## 2022-11-13 ENCOUNTER — Other Ambulatory Visit: Payer: Self-pay | Admitting: Physician Assistant

## 2022-11-18 ENCOUNTER — Other Ambulatory Visit: Payer: Self-pay | Admitting: Physician Assistant

## 2022-11-18 DIAGNOSIS — Z1231 Encounter for screening mammogram for malignant neoplasm of breast: Secondary | ICD-10-CM

## 2022-11-19 ENCOUNTER — Ambulatory Visit (INDEPENDENT_AMBULATORY_CARE_PROVIDER_SITE_OTHER): Payer: Medicare HMO | Admitting: *Deleted

## 2022-11-19 DIAGNOSIS — Z23 Encounter for immunization: Secondary | ICD-10-CM | POA: Diagnosis not present

## 2022-11-19 NOTE — Progress Notes (Signed)
Pt here for Tdap, vaccine given in the left deltoid. Tolerated well.

## 2022-12-01 DIAGNOSIS — R69 Illness, unspecified: Secondary | ICD-10-CM | POA: Diagnosis not present

## 2022-12-03 DIAGNOSIS — L814 Other melanin hyperpigmentation: Secondary | ICD-10-CM | POA: Diagnosis not present

## 2022-12-03 DIAGNOSIS — D225 Melanocytic nevi of trunk: Secondary | ICD-10-CM | POA: Diagnosis not present

## 2022-12-03 DIAGNOSIS — L304 Erythema intertrigo: Secondary | ICD-10-CM | POA: Diagnosis not present

## 2022-12-03 DIAGNOSIS — Z411 Encounter for cosmetic surgery: Secondary | ICD-10-CM | POA: Diagnosis not present

## 2022-12-03 DIAGNOSIS — L578 Other skin changes due to chronic exposure to nonionizing radiation: Secondary | ICD-10-CM | POA: Diagnosis not present

## 2022-12-03 DIAGNOSIS — L821 Other seborrheic keratosis: Secondary | ICD-10-CM | POA: Diagnosis not present

## 2022-12-12 DIAGNOSIS — H2513 Age-related nuclear cataract, bilateral: Secondary | ICD-10-CM | POA: Diagnosis not present

## 2022-12-30 DIAGNOSIS — Z961 Presence of intraocular lens: Secondary | ICD-10-CM | POA: Diagnosis not present

## 2022-12-30 DIAGNOSIS — H269 Unspecified cataract: Secondary | ICD-10-CM | POA: Diagnosis not present

## 2022-12-30 DIAGNOSIS — H2512 Age-related nuclear cataract, left eye: Secondary | ICD-10-CM | POA: Diagnosis not present

## 2022-12-31 ENCOUNTER — Encounter: Payer: Self-pay | Admitting: Physician Assistant

## 2022-12-31 ENCOUNTER — Ambulatory Visit (INDEPENDENT_AMBULATORY_CARE_PROVIDER_SITE_OTHER): Payer: Medicare HMO | Admitting: Physician Assistant

## 2022-12-31 ENCOUNTER — Telehealth: Payer: Self-pay | Admitting: Physician Assistant

## 2022-12-31 VITALS — BP 138/82 | HR 62 | Temp 97.8°F | Ht 65.0 in | Wt 163.2 lb

## 2022-12-31 DIAGNOSIS — I10 Essential (primary) hypertension: Secondary | ICD-10-CM

## 2022-12-31 NOTE — Telephone Encounter (Signed)
Patient states: - Been experiencing elevated BP since yesterday  - At time of cataract surgery her reading was 184/84.  - This morning reading was 158/94 and 5 minutes ago was 172/90  - No other symptoms   Patient transferred to triage.

## 2022-12-31 NOTE — Telephone Encounter (Signed)
Noted and agreed, thank you. 

## 2022-12-31 NOTE — Progress Notes (Signed)
Subjective:    Patient ID: [name], female    DOB: 06-May-1951, 72 y.o.   MRN: [id number]  Chief Complaint  Patient presents with   Hypertension    Hypertension   Patient is in today for recheck HTN.   Just had cataract surgery on left eye yesterday. BP was elevated yesterday, but she says she was anxious. 184/84 there and then it was 192/84 on recheck. She was given a prescription to help her relax during the procedure, but she doesn't remember BP being rechecked.  Husband checked her BP this morning - 158/84 - this was before she ate.  178/84 after breakfast and regular coffee.   Started taking lisinopril 20 mg in the evenings instead of mornings awhile ago.  No symptoms to report such as chest pain, headaches, SOB, dizziness, etc.   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   Hypertension    PONV (postoperative nausea and vomiting)    Seasonal allergies    Spider veins 11/23/2013    Past Surgical History:  Procedure Laterality Date   BREAST BIOPSY     left   BREAST LUMPECTOMY  05/1998   left breast- benign (lipoma)   COLONOSCOPY  2005   Dr [name]   DENTAL SURGERY     Cyst on right bottom tooth   SUPRACLAVICAL NODE BIOPSY Left 09/01/2022   Procedure: EXCISION LIPOMA LEFT SUPRACLAVICAL FOSSA;  Surgeon: Erin Nakayama, MD;  Location: [facility] OR;  Service: Thoracic;  Laterality: Left;   TOE SURGERY     TONSILLECTOMY AND ADENOIDECTOMY     UPPER GASTROINTESTINAL ENDOSCOPY      Family History   Problem Relation Age of Onset   Heart disease Mother    Diabetes Mother    Cancer Mother        hodgekins lymphoma   Diabetes Sister    Diabetes Maternal Grandmother    Colon cancer Neg Hx    Stomach cancer Neg Hx     Social History   Tobacco Use   Smoking status: Never   Smokeless tobacco: Never  Vaping Use   Vaping Use: Never used  Substance Use Topics   Alcohol use: Yes    Alcohol/week: 8.0 standard drinks of alcohol    Types: 8 Glasses of wine per week    Comment: Social   Drug use: No     Allergies  Allergen Reactions   Sulfonamide Derivatives Hives   Cefdinir Rash    Review of Systems NEGATIVE UNLESS OTHERWISE INDICATED IN HPI      Objective:     BP 138/82   Pulse 62   Temp 97.8 F (36.6 C)   Ht '5\' 5"'$  (1.651 m)   Wt 163 lb 3.2 oz (74 kg)   LMP 10/20/2000   SpO2 97%   BMI 27.16 kg/m   Wt Readings from Last 3 Encounters:  12/31/22 163 lb 3.2 oz (74 kg)  09/09/22 162  lb (73.5 kg)  09/01/22 160 lb (72.6 kg)    BP Readings from Last 3 Encounters:  12/31/22 138/82  09/09/22 (!) 170/77  09/01/22 (!) 144/82     Physical Exam Vitals and nursing note reviewed.  Constitutional:      Appearance: Normal appearance.  Eyes:     Extraocular Movements: Extraocular movements intact.     Conjunctiva/sclera: Conjunctivae normal.     Pupils: Pupils are equal, round, and reactive to light.  Cardiovascular:     Rate and Rhythm: Normal rate and regular rhythm.     Pulses: Normal pulses.     Heart sounds: No murmur heard. Pulmonary:     Effort: Pulmonary effort is normal.     Breath sounds: Normal breath sounds.  Skin:    Findings: No rash.  Neurological:     General: No focal deficit present.     Mental Status: She is alert and oriented to person, place, and time.  Psychiatric:        Mood and Affect: Mood normal.        Behavior: Behavior normal.        Thought Content: Thought content normal.        Judgment: Judgment normal.         Assessment & Plan:  Essential hypertension  -Normal reading in office today -Plan to have you keep track of BP at home twice daily, bring log AND your machine to next appointment in 2 weeks. -Continue on Lisinopril 20 mg daily at this time. -Drink plenty of water, keep active as you have been! -Pt to call sooner if any changes or concerns      Return in about 2 weeks (around 01/14/2023) for BP recheck .      M , PA-C

## 2022-12-31 NOTE — Patient Instructions (Signed)
Very good to see you again today!  Let's plan to have you keep track of BP at home twice daily, bring log AND your machine to next appointment in 2 weeks.  Continue on Lisinopril 20 mg daily at this time.  Drink plenty of water, keep active as you have been!  Call sooner if any concerns.

## 2022-12-31 NOTE — Telephone Encounter (Signed)
Final Disposition: see PCP within 3 days  Patient Name: [name] Gender: Female DOB: 02-Nov-1950 Age: 72 Y 11 M 17 D Return Phone Number: [phone] (Primary) Address: City/ State/ Zip: [place]  [address] Client [facility] Client Site [facility] Paediatric nurse, Freight forwarder- PA Contact Type Call Who Is Calling Patient / Member / Family / Caregiver Call Type Triage / Clinical Relationship To Patient Self Return Phone Number [phone] (Primary) Chief Complaint Blood Pressure High Reason for Call Symptomatic / Request for [place] states she is having high blood pressure 172/90 Translation No Nurse Assessment Nurse: Ysidro Evert, RN, Levada Dy Date/Time (Eastern Time): 12/31/2022 9:44:32 AM Confirm and document reason for call. If symptomatic, describe symptoms. ---Caller states her blood pressure is 172/90. It was higher yesterday before cataract surgery. She denies any symptoms Does the patient have any new or worsening symptoms? ---Yes Will a triage be completed? ---Yes Related visit to physician within the last 2 weeks? ---No Does the PT have any chronic conditions? (i.e. diabetes, asthma, this includes High risk factors for pregnancy, etc.) ---Yes List chronic conditions. ---hypertension Is this a behavioral health or substance abuse call? ---No Guidelines Guideline Title Affirmed Question Affirmed Notes Nurse Date/Time (Eastern Time) Blood Pressure - High Systolic BP >= 0000000 OR Diastolic >= 123XX123 Ysidro Evert, RN, Levada Dy 12/31/2022 9:47:03 AM Disp. Time Eilene Ghazi Time) Disposition Final User 12/31/2022 9:30:52 AM Attempt made - message left Earleen Reaper 12/31/2022 9:51:24 AM SEE PCP WITHIN 3 DAYS Yes Ysidro Evert RN, Levada Dy   Final Disposition 12/31/2022 9:51:24 AM SEE PCP WITHIN 3 DAYS Yes Ysidro Evert, RN, Marin Shutter Disagree/Comply Comply Caller Understands  Yes PreDisposition Did not know what to do Care Advice Given Per Guideline SEE PCP WITHIN 3 DAYS: * You need to be seen within 2 or 3 days. CALL BACK IF: * Weakness or numbness of the face, arm or leg on one side of the body occurs * Chest pain or difficulty breathing occurs * Difficulty walking, difficulty talking, or severe headache occurs * You become worse * Your blood pressure is over 180/110 CARE ADVICE given per High Blood Pressure (Adult) guideline. Referrals REFERRED TO PCP OFFICE

## 2023-01-06 ENCOUNTER — Ambulatory Visit: Payer: PPO

## 2023-01-13 ENCOUNTER — Ambulatory Visit (INDEPENDENT_AMBULATORY_CARE_PROVIDER_SITE_OTHER): Payer: Medicare HMO | Admitting: Physician Assistant

## 2023-01-13 ENCOUNTER — Encounter: Payer: Self-pay | Admitting: Physician Assistant

## 2023-01-13 VITALS — BP 149/84 | HR 60 | Temp 97.5°F | Ht 65.0 in | Wt 165.0 lb

## 2023-01-13 DIAGNOSIS — I10 Essential (primary) hypertension: Secondary | ICD-10-CM | POA: Diagnosis not present

## 2023-01-13 NOTE — Progress Notes (Signed)
Subjective:    Patient ID: [name], female    DOB: 1950-12-30, 72 y.o.   MRN: [id number]  Chief Complaint  Patient presents with   Hypertension    Pt in office for 2 wk BP recheck; pt brought recent bp readings with her; pt taking bp meds at night starting 6 months ago and used to take in the morning and this is the only change she has made.     Hypertension   Patient is in today for BP recheck. Taking Lisinopril 20 mg daily every evening. No symptoms or concerns.   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   Hypertension    PONV (postoperative nausea and vomiting)    Seasonal allergies    Spider veins 11/23/2013    Past Surgical History:  Procedure Laterality Date   BREAST BIOPSY     left   BREAST LUMPECTOMY  05/1998   left breast- benign (lipoma)   COLONOSCOPY  2005   Dr Lyla Son   DENTAL SURGERY     Cyst on right bottom tooth   SUPRACLAVICAL NODE BIOPSY Left 09/01/2022   Procedure: EXCISION LIPOMA LEFT SUPRACLAVICAL FOSSA;  Surgeon: Melrose Nakayama, MD;  Location: [facility] OR;  Service: Thoracic;  Laterality: Left;   TOE SURGERY     TONSILLECTOMY AND ADENOIDECTOMY     UPPER GASTROINTESTINAL ENDOSCOPY      Family History  Problem Relation Age of Onset   Heart disease Mother    Diabetes Mother    Cancer Mother        hodgekins lymphoma   Diabetes Sister    Diabetes Maternal Grandmother    Colon cancer Neg Hx     Stomach cancer Neg Hx     Social History   Tobacco Use   Smoking status: Never   Smokeless tobacco: Never  Vaping Use   Vaping Use: Never used  Substance Use Topics   Alcohol use: Yes    Alcohol/week: 8.0 standard drinks of alcohol    Types: 8 Glasses of wine per week    Comment: Social   Drug use: No     Allergies  Allergen Reactions   Sulfonamide Derivatives Hives   Cefdinir Rash    Review of Systems NEGATIVE UNLESS OTHERWISE INDICATED IN HPI      Objective:     BP (!) 149/84 (BP Location: Left Arm)   Pulse 60   Temp (!) 97.5 F (36.4 C) (Temporal)   Ht 5\' 5"  (1.651 m)   Wt 165 lb (74.8 kg)   LMP 10/20/2000   SpO2 97%   BMI 27.46 kg/m   Wt Readings from Last 3 Encounters:  01/13/23 165 lb (74.8 kg)  12/31/22 163 lb 3.2 oz (74 kg)  09/09/22 162 lb (73.5 kg)    BP Readings from Last 3 Encounters:  01/13/23 (!) 149/84  12/31/22 138/82  09/09/22 (!) 170/77     Physical Exam Vitals and nursing note reviewed.  Constitutional:      Appearance: Normal appearance.  Cardiovascular:     Rate and Rhythm: Normal rate and regular rhythm.  Pulmonary:     Effort: Pulmonary effort is normal.     Breath sounds: Normal breath sounds.  Neurological:     General: No focal deficit present.     Mental Status: She is alert.  Psychiatric:        Mood and Affect: Mood normal.        Assessment & Plan:  Essential hypertension Assessment & Plan: Elevated in office, but average readings at home normal range. Continue lisinopril 20 mg. Continue monitoring at home. Great work with lifestyle. Call if averaging 140s/90s or higher.        Return in about 6 months (around 07/16/2023) for med check & fasting labs.      M , PA-C

## 2023-01-13 NOTE — Assessment & Plan Note (Signed)
Elevated in office, but average readings at home normal range. Continue lisinopril 20 mg. Continue monitoring at home. Great work with lifestyle. Call if averaging 140s/90s or higher.

## 2023-01-13 NOTE — Patient Instructions (Signed)
Elevated in office, but average readings at home normal range. Continue lisinopril 20 mg. Continue monitoring at home. Great work with lifestyle. Call if averaging 140s/90s or higher.

## 2023-01-20 DIAGNOSIS — H2511 Age-related nuclear cataract, right eye: Secondary | ICD-10-CM | POA: Diagnosis not present

## 2023-01-20 DIAGNOSIS — H269 Unspecified cataract: Secondary | ICD-10-CM | POA: Diagnosis not present

## 2023-01-20 DIAGNOSIS — Z961 Presence of intraocular lens: Secondary | ICD-10-CM | POA: Diagnosis not present

## 2023-02-01 DIAGNOSIS — R69 Illness, unspecified: Secondary | ICD-10-CM | POA: Diagnosis not present

## 2023-02-03 ENCOUNTER — Telehealth: Payer: Self-pay | Admitting: Physician Assistant

## 2023-02-03 NOTE — Telephone Encounter (Signed)
Copied from CRM [id number]. Topic: Medicare AWV >> Feb 03, 2023 10:58 AM Gwenith Spitz wrote: Reason for CRM: Called patient to schedule Medicare Annual Wellness Visit (AWV). Left message for patient to call back and schedule Medicare Annual Wellness Visit (AWV).  Last date of AWV: 01/30/2022  Please schedule an appointment at any time with Inetta Fermo, [facility]. Please schedule AWVS with Inetta Fermo, [facility].  If any questions, please contact me at  [phone] Thank you ,   Gabriel Cirri [facility] AWV TEAM Direct Dial [phone].

## 2023-02-06 ENCOUNTER — Other Ambulatory Visit: Payer: Self-pay | Admitting: Physician Assistant

## 2023-02-10 ENCOUNTER — Telehealth: Payer: Self-pay | Admitting: Physician Assistant

## 2023-02-10 DIAGNOSIS — R32 Unspecified urinary incontinence: Secondary | ICD-10-CM | POA: Diagnosis not present

## 2023-02-10 DIAGNOSIS — Z833 Family history of diabetes mellitus: Secondary | ICD-10-CM | POA: Diagnosis not present

## 2023-02-10 DIAGNOSIS — E785 Hyperlipidemia, unspecified: Secondary | ICD-10-CM | POA: Diagnosis not present

## 2023-02-10 DIAGNOSIS — Z008 Encounter for other general examination: Secondary | ICD-10-CM | POA: Diagnosis not present

## 2023-02-10 DIAGNOSIS — Z811 Family history of alcohol abuse and dependence: Secondary | ICD-10-CM | POA: Diagnosis not present

## 2023-02-10 DIAGNOSIS — I10 Essential (primary) hypertension: Secondary | ICD-10-CM | POA: Diagnosis not present

## 2023-02-10 DIAGNOSIS — Z882 Allergy status to sulfonamides status: Secondary | ICD-10-CM | POA: Diagnosis not present

## 2023-02-10 DIAGNOSIS — Z8249 Family history of ischemic heart disease and other diseases of the circulatory system: Secondary | ICD-10-CM | POA: Diagnosis not present

## 2023-02-10 DIAGNOSIS — R011 Cardiac murmur, unspecified: Secondary | ICD-10-CM | POA: Diagnosis not present

## 2023-02-10 DIAGNOSIS — Z809 Family history of malignant neoplasm, unspecified: Secondary | ICD-10-CM | POA: Diagnosis not present

## 2023-02-10 NOTE — Telephone Encounter (Signed)
Contacted Cristle Guinn to schedule their annual wellness visit. Appointment made for 02/23/2023.  Gabriel Cirri [facility] AWV TEAM Direct Dial [phone]

## 2023-02-10 NOTE — Telephone Encounter (Signed)
Contacted Skylinn Clerk to schedule their annual wellness visit. Appointment made for 02/24/2023.  Gabriel Cirri [facility] AWV TEAM Direct Dial [phone]

## 2023-02-12 DIAGNOSIS — M1711 Unilateral primary osteoarthritis, right knee: Secondary | ICD-10-CM | POA: Diagnosis not present

## 2023-02-16 ENCOUNTER — Ambulatory Visit
Admission: RE | Admit: 2023-02-16 | Discharge: 2023-02-16 | Disposition: A | Discharge status: Home / Self Care | Payer: Medicare HMO | Source: Ambulatory Visit | Attending: Physician Assistant | Admitting: Physician Assistant

## 2023-02-16 ENCOUNTER — Telehealth: Payer: Self-pay | Admitting: Physician Assistant

## 2023-02-16 DIAGNOSIS — Z1231 Encounter for screening mammogram for malignant neoplasm of breast: Secondary | ICD-10-CM | POA: Diagnosis not present

## 2023-02-16 NOTE — Telephone Encounter (Signed)
Contacted [name] to schedule their annual wellness visit. Appointment made for 03/02/2023.  Gabriel Cirri [facility] AWV TEAM Direct Dial [phone]

## 2023-03-02 ENCOUNTER — Ambulatory Visit (INDEPENDENT_AMBULATORY_CARE_PROVIDER_SITE_OTHER): Payer: Medicare HMO

## 2023-03-02 VITALS — BP 140/74 | HR 61 | Temp 97.1°F | Wt 163.6 lb

## 2023-03-02 DIAGNOSIS — Z0000 Encounter for general adult medical examination without abnormal findings: Secondary | ICD-10-CM

## 2023-03-02 NOTE — Patient Instructions (Signed)
[name] , Thank you for taking time to come for your Medicare Wellness Visit. I appreciate your ongoing commitment to your health goals. Please review the following plan we discussed and let me know if I can assist you in the future.   These are the goals we discussed:  Goals      Patient Stated     Lose weight      Patient Stated     Lose weight      Reduce caffeine intake     Reduce chocolate at bedtime      Track and Manage My Blood Pressure-Hypertension     Timeframe:  Long-Range Goal Priority:  High Start Date:  07/22/21                           Expected End Date:  01/20/22                   Follow Up Date 10/22/21    - check blood pressure weekly - choose a place to take my blood pressure (home, clinic or office, retail store) - write blood pressure results in a log or diary    Why is this important?   You won't feel high blood pressure, but it can still hurt your blood vessels.  High blood pressure can cause heart or kidney problems. It can also cause a stroke.  Making lifestyle changes like losing a little weight or eating less salt will help.  Checking your blood pressure at home and at different times of the day can help to control blood pressure.  If the doctor prescribes medicine remember to take it the way the doctor ordered.  Call the office if you cannot afford the medicine or if there are questions about it.     Notes:         This is a list of the screening recommended for you and due dates:  Health Maintenance  Topic Date Due   COVID-19 Vaccine (4 - 2023-24 season) 06/20/2022   Medicare Annual Wellness Visit  01/31/2023   Flu Shot  05/21/2023   Colon Cancer Screening  08/04/2024   Mammogram  02/15/2025   DTaP/Tdap/Td vaccine (5 - Td or Tdap) 11/19/2032   Pneumonia Vaccine  Completed   DEXA scan (bone density measurement)  Completed   Zoster (Shingles) Vaccine  Completed   Hepatitis C Screening: USPSTF Recommendation to screen - Ages 52-79 yo.   Addressed   HPV Vaccine  Aged Out    Advanced directives: copies in chart   Conditions/risks identified: lose 15 lbs   Next appointment: Follow up in one year for your annual wellness visit    Preventive Care 65 Years and Older, Female Preventive care refers to lifestyle choices and visits with your health care provider that can promote health and wellness. What does preventive care include? A yearly physical exam. This is also called an annual well check. Dental exams once or twice a year. Routine eye exams. Ask your health care provider how often you should have your eyes checked. Personal lifestyle choices, including: Daily care of your teeth and gums. Regular physical activity. Eating a healthy diet. Avoiding tobacco and drug use. Limiting alcohol use. Practicing safe sex. Taking low-dose aspirin every day. Taking vitamin and mineral supplements as recommended by your health care provider. What happens during an annual well check? The services and screenings done by your health care provider during your annual  well check will depend on your age, overall health, lifestyle risk factors, and family history of disease. Counseling  Your health care provider may ask you questions about your: Alcohol use. Tobacco use. Drug use. Emotional well-being. Home and relationship well-being. Sexual activity. Eating habits. History of falls. Memory and ability to understand (cognition). Work and work Astronomer. Reproductive health. Screening  You may have the following tests or measurements: Height, weight, and BMI. Blood pressure. Lipid and cholesterol levels. These may be checked every 5 years, or more frequently if you are over 85 years old. Skin check. Lung cancer screening. You may have this screening every year starting at age 50 if you have a 30-pack-year history of smoking and currently smoke or have quit within the past 15 years. Fecal occult blood test (FOBT) of the  stool. You may have this test every year starting at age 42. Flexible sigmoidoscopy or colonoscopy. You may have a sigmoidoscopy every 5 years or a colonoscopy every 10 years starting at age 31. Hepatitis C blood test. Hepatitis B blood test. Sexually transmitted disease (STD) testing. Diabetes screening. This is done by checking your blood sugar (glucose) after you have not eaten for a while (fasting). You may have this done every 1-3 years. Bone density scan. This is done to screen for osteoporosis. You may have this done starting at age 6. Mammogram. This may be done every 1-2 years. Talk to your health care provider about how often you should have regular mammograms. Talk with your health care provider about your test results, treatment options, and if necessary, the need for more tests. Vaccines  Your health care provider may recommend certain vaccines, such as: Influenza vaccine. This is recommended every year. Tetanus, diphtheria, and acellular pertussis (Tdap, Td) vaccine. You may need a Td booster every 10 years. Zoster vaccine. You may need this after age 1. Pneumococcal 13-valent conjugate (PCV13) vaccine. One dose is recommended after age 76. Pneumococcal polysaccharide (PPSV23) vaccine. One dose is recommended after age 9. Talk to your health care provider about which screenings and vaccines you need and how often you need them. This information is not intended to replace advice given to you by your health care provider. Make sure you discuss any questions you have with your health care provider. Document Released: 11/02/2015 Document Revised: 06/25/2016 Document Reviewed: 08/07/2015 Elsevier Interactive Patient Education  2017 [employer].  Fall Prevention in the Home Falls can cause injuries. They can happen to people of all ages. There are many things you can do to make your home safe and to help prevent falls. What can I do on the outside of my home? Regularly fix the  edges of walkways and driveways and fix any cracks. Remove anything that might make you trip as you walk through a door, such as a raised step or threshold. Trim any bushes or trees on the path to your home. Use bright outdoor lighting. Clear any walking paths of anything that might make someone trip, such as rocks or tools. Regularly check to see if handrails are loose or broken. Make sure that both sides of any steps have handrails. Any raised decks and porches should have guardrails on the edges. Have any leaves, snow, or ice cleared regularly. Use sand or salt on walking paths during winter. Clean up any spills in your garage right away. This includes oil or grease spills. What can I do in the bathroom? Use night lights. Install grab bars by the toilet and in the  tub and shower. Do not use towel bars as grab bars. Use non-skid mats or decals in the tub or shower. If you need to sit down in the shower, use a plastic, non-slip stool. Keep the floor dry. Clean up any water that spills on the floor as soon as it happens. Remove soap buildup in the tub or shower regularly. Attach bath mats securely with double-sided non-slip rug tape. Do not have throw rugs and other things on the floor that can make you trip. What can I do in the bedroom? Use night lights. Make sure that you have a light by your bed that is easy to reach. Do not use any sheets or blankets that are too big for your bed. They should not hang down onto the floor. Have a firm chair that has side arms. You can use this for support while you get dressed. Do not have throw rugs and other things on the floor that can make you trip. What can I do in the kitchen? Clean up any spills right away. Avoid walking on wet floors. Keep items that you use a lot in easy-to-reach places. If you need to reach something above you, use a strong step stool that has a grab bar. Keep electrical cords out of the way. Do not use floor polish or  wax that makes floors slippery. If you must use wax, use non-skid floor wax. Do not have throw rugs and other things on the floor that can make you trip. What can I do with my stairs? Do not leave any items on the stairs. Make sure that there are handrails on both sides of the stairs and use them. Fix handrails that are broken or loose. Make sure that handrails are as long as the stairways. Check any carpeting to make sure that it is firmly attached to the stairs. Fix any carpet that is loose or worn. Avoid having throw rugs at the top or bottom of the stairs. If you do have throw rugs, attach them to the floor with carpet tape. Make sure that you have a light switch at the top of the stairs and the bottom of the stairs. If you do not have them, ask someone to add them for you. What else can I do to help prevent falls? Wear shoes that: Do not have high heels. Have rubber bottoms. Are comfortable and fit you well. Are closed at the toe. Do not wear sandals. If you use a stepladder: Make sure that it is fully opened. Do not climb a closed stepladder. Make sure that both sides of the stepladder are locked into place. Ask someone to hold it for you, if possible. Clearly mark and make sure that you can see: Any grab bars or handrails. First and last steps. Where the edge of each step is. Use tools that help you move around (mobility aids) if they are needed. These include: Canes. Walkers. Scooters. Crutches. Turn on the lights when you go into a dark area. Replace any light bulbs as soon as they burn out. Set up your furniture so you have a clear path. Avoid moving your furniture around. If any of your floors are uneven, fix them. If there are any pets around you, be aware of where they are. Review your medicines with your doctor. Some medicines can make you feel dizzy. This can increase your chance of falling. Ask your doctor what other things that you can do to help prevent falls. This  information is  not intended to replace advice given to you by your health care provider. Make sure you discuss any questions you have with your health care provider. Document Released: 08/02/2009 Document Revised: 03/13/2016 Document Reviewed: 11/10/2014 Elsevier Interactive Patient Education  2017 [employer].

## 2023-03-02 NOTE — Progress Notes (Signed)
Subjective:   [name] is a 72 y.o. female who presents for Medicare Annual (Subsequent) preventive examination.     Patient Medicare AWV questionnaire was completed by the patient on 02/26/23; I have confirmed that all information answered by patient is correct and no changes since this date.     Review of Systems     Cardiac Risk Factors include: advanced age (>69men, >38 women);dyslipidemia;hypertension     Objective:    Today's Vitals   03/02/23 1030  BP: (!) 140/74  Pulse: 61  Temp: (!) 97.1 F (36.2 C)  SpO2: 93%  Weight: 163 lb 9.6 oz (74.2 kg)   Body mass index is 27.22 kg/m.     03/02/2023   10:44 AM 08/28/2022    1:21 PM 01/30/2022    1:07 PM 01/21/2021    1:15 PM 07/25/2019   11:44 AM 09/17/2015    8:34 AM 08/04/2014   11:26 AM  Advanced Directives  Does Patient Have a Medical Advance Directive? Yes Yes Yes Yes Yes Yes No  Type of Estate agent of [place];Living will Healthcare Power of [place];Living will Healthcare Power of Attorney Living will Living will;Healthcare Power of Attorney Healthcare Power of Attorney   Does patient want to make changes to medical advance directive? No - Patient declined No - Patient declined   No - Patient declined    Copy of Healthcare Power of Attorney in Chart? Yes - validated most recent copy scanned in chart (See row information) No - copy requested Yes - validated most recent copy scanned in chart (See row information)  No - copy requested No - copy requested     Current Medications (verified) Outpatient Encounter Medications as of 03/02/2023  Medication Sig   fluticasone (FLONASE) 50 MCG/ACT nasal spray Place 1 spray into both nostrils daily.   lisinopril (ZESTRIL) 20 MG tablet TAKE 1 TABLET BY MOUTH EVERY DAY   Multiple Vitamins-Minerals (HAIR/SKIN/NAILS/BIOTIN PO) Take 1 capsule by mouth daily.   Multiple Vitamins-Minerals (MULTIVITAMIN PO) Take 1 tablet by mouth daily.   naproxen sodium  (ALEVE) 220 MG tablet Take 220 mg by mouth daily as needed (pain).   Probiotic Product (PROBIOTIC PO) Take 1 capsule by mouth daily.   rosuvastatin (CRESTOR) 5 MG tablet TAKE 1 TABLET BY MOUTH EVERY DAY   valACYclovir (VALTREX) 500 MG tablet TAKE 1 TABLET BY MOUTH TWICE A DAY AS NEEDED   [DISCONTINUED] estradiol (ESTRACE VAGINAL) 0.1 MG/GM vaginal cream Place 1 g vaginally 3 (three) times a week. (Patient not taking: Reported on 12/31/2022)   [DISCONTINUED] oxyCODONE (OXY IR/ROXICODONE) 5 MG immediate release tablet Take 1 tablet (5 mg total) by mouth every 6 (six) hours as needed for moderate pain or breakthrough pain. (Patient not taking: Reported on 12/31/2022)   No facility-administered encounter medications on file as of 03/02/2023.    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  Hypertension    PONV (postoperative nausea and vomiting)    Seasonal allergies    Spider veins 11/23/2013   Past Surgical History:  Procedure Laterality Date   BREAST BIOPSY     left   COLONOSCOPY  2005   Dr Victorino Dike   DENTAL SURGERY     Cyst on right bottom tooth   SUPRACLAVICAL NODE BIOPSY Left 09/01/2022   Procedure: EXCISION LIPOMA LEFT SUPRACLAVICAL FOSSA;  Surgeon: Loreli Slot, MD;  Location: [facility] OR;  Service: Thoracic;  Laterality: Left;   TOE SURGERY     TONSILLECTOMY AND ADENOIDECTOMY     UPPER GASTROINTESTINAL ENDOSCOPY     Family History  Problem  Relation Age of Onset   Heart disease Mother    Diabetes Mother    Cancer Mother        hodgekins lymphoma   Diabetes Sister    Diabetes Maternal Grandmother    Colon cancer Neg Hx    Stomach cancer Neg Hx    Social History   Socioeconomic History   Marital status: Married    Spouse name: Not on file   Number of children: Not on file   Years of education: Not on file   Highest education level: 12th grade  Occupational History   Not on file  Tobacco Use   Smoking status: Never   Smokeless tobacco: Never  Vaping Use   Vaping Use: Never used  Substance and Sexual Activity   Alcohol use: Yes    Alcohol/week: 8.0 standard drinks of alcohol    Types: 8 Glasses of wine per week    Comment: Social   Drug use: No   Sexual activity: Yes    Partners: Male    Birth control/protection: Post-menopausal    Comment: Asked Medicare questions---no to all  Other Topics Concern   Not on file  Social History Narrative   Work or School: retired, husband is retired      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      Spiritual Beliefs: [place] - nondenominational, Christian      Lifestyle: healthy diet for the most part, plays golf and tennis      Social Determinants of Health   Financial Resource Strain: Low Risk  (02/26/2023)   Overall Financial Resource Strain (CARDIA)    Difficulty of Paying Living Expenses: Not hard at all  Food Insecurity: No Food Insecurity (02/26/2023)   Hunger Vital Sign    Worried About Running Out of Food in the Last Year: Never true    Ran Out of Food in the Last Year: Never true  Transportation Needs: No Transportation Needs (02/26/2023)   PRAPARE - Administrator, Civil Service (Medical): No    Lack of Transportation (Non-Medical): No  Physical Activity: Insufficiently Active (02/26/2023)   Exercise Vital Sign    Days of Exercise per Week: 3 days    Minutes of Exercise per Session: 40 min  Stress: No Stress Concern Present (02/26/2023)   [employer] of Occupational Health - Occupational Stress Questionnaire    Feeling of Stress : Not at all  Recent Concern: Stress - Stress Concern Present (01/09/2023)   [employer] of Occupational Health - Occupational Stress Questionnaire    Feeling of Stress : To some extent  Social Connections: Socially Integrated (02/26/2023)   Social Connection and Isolation Panel [NHANES]    Frequency of Communication with Friends and Family: More than three times a week    Frequency of Social  Gatherings with Friends and Family: Three times a week    Attends Religious Services: More than 4 times per year    Active Member of Clubs or Organizations: Yes    Attends Engineer, structural: More than 4 times per year    Marital Status: Married    Tobacco Counseling Counseling given: Not Answered   Clinical Intake:  Pre-visit preparation completed: Yes  Pain : No/denies pain     BMI - recorded: 27.22 Nutritional Status: BMI 25 -29 Overweight Nutritional Risks: None Diabetes: No  How often do you need to have someone help you when you read instructions, pamphlets, or other written materials from your doctor or pharmacy?: (P) 1 - Never  Diabetic?no  Interpreter Needed?: No  Information entered by :: Lanier Ensign, LPN   Activities of Daily Living    02/26/2023    8:49 AM 08/28/2022    1:24 PM  In your present state of health, do you have any difficulty performing the following activities:  Hearing? 0   Vision? 0   Difficulty concentrating or making decisions? 0   Walking or climbing stairs? 0   Dressing or bathing? 0   Doing errands, shopping? 0 0  Preparing Food and eating ? N   Using the Toilet? N   In the past six months, have you accidently leaked urine? Y   Comment urgency   Do you have problems with loss of bowel control? N   Managing your Medications? N   Managing your Finances? N   Housekeeping or managing your Housekeeping? N     Patient Care  Team: Allwardt, Crist Infante, PA-C as PCP - General (Physician Assistant) Antony Contras, MD as Consulting Physician (Ophthalmology) Sharyn Lull, Elvin So, MD as Consulting Physician (Dermatology) Frederico Hamman, MD as Consulting Physician (Orthopedic Surgery) Erroll Luna, [facility] as Pharmacist (Pharmacist)  Indicate any recent Medical Services you may have received from other than [facility] providers in the past year (date may be approximate).     Assessment:   This is a routine wellness examination for Rochell.  Hearing/Vision screen Hearing Screening - Comments:: Pt denies any hearing issues  Vision Screening - Comments:: Pt follows up with Dr Antony Contras for annual eye exams   Dietary issues and exercise activities discussed: Current Exercise Habits: Home exercise routine, Type of exercise: walking;Other - see comments, Time (Minutes): > 60, Frequency (Times/Week): 3, Weekly Exercise (Minutes/Week): 0   Goals Addressed             This Visit's Progress    Patient Stated       Lose 15 lbs        Depression Screen    03/02/2023   10:38 AM 12/31/2022   11:02 AM 01/30/2022    1:06 PM 02/04/2021    9:36 AM 01/21/2021    1:13 PM 06/04/2020    2:40 PM 05/02/2020    2:53 PM  PHQ 2/9 Scores  PHQ - 2 Score 0 0 0 0 0 0 5  PHQ- 9 Score  1    1 16     Fall Risk    02/26/2023    8:49 AM 12/31/2022   11:02 AM 01/30/2022    1:09 PM 02/04/2021    9:36 AM 01/21/2021    1:17 PM  Fall Risk   Falls in the past year? 0 0 0 0 0  Number falls in past yr: 0 0 0  0  Injury with Fall? 0 0 0  0  Risk for fall due to : Impaired vision  Impaired vision  Impaired vision  Follow up Falls prevention discussed  Falls prevention discussed  Falls prevention discussed    FALL RISK PREVENTION PERTAINING TO THE HOME:  Any stairs in or around the home? Yes  If so, are there any without handrails? No  Home free of loose throw rugs in walkways, pet beds, electrical cords, etc? Yes  Adequate lighting in  your home to reduce risk of falls? Yes   ASSISTIVE DEVICES UTILIZED TO PREVENT FALLS:  Life alert? No  Use of a cane, walker or w/c? No  Grab bars in the bathroom? No  Shower chair or bench in shower? No  Elevated toilet seat or a handicapped toilet? No   TIMED UP AND GO:  Was the test performed? Yes .  Length of time to ambulate 10 feet: 10 sec.   Gait steady and fast without use of assistive device  Cognitive Function:        03/02/2023   10:46 AM 01/30/2022    1:12 PM 01/21/2021    1:19 PM 07/25/2019   11:45 AM  6CIT Screen  What Year? 0 points 0 points 0 points 0 points  What month? 0 points 0 points 0 points 0 points  What time? 0 points 0 points  0 points  Count back from 20 0 points 0 points 0 points 0 points  Months in reverse 0 points 0 points 0 points 0 points  Repeat phrase 0 points 0 points 0 points 0 points  Total Score 0 points 0 points  0 points    Immunizations Immunization History  Administered Date(s) Administered   Fluad Quad(high Dose 65+) 07/25/2019, 07/25/2020, 07/03/2021, 08/07/2022   Influenza Split 08/19/2011, 07/13/2012   Influenza Whole 07/21/2008   Influenza, High Dose Seasonal PF 08/21/2015, 07/16/2016, 07/31/2017, 07/21/2018, 07/03/2021   Influenza,inj,Quad PF,6+ Mos 07/12/2013, 07/27/2014   PFIZER(Purple Top)SARS-COV-2 Vaccination 12/02/2019, 12/24/2019, 08/15/2020   Pneumococcal Conjugate-13 03/12/2015   Pneumococcal Polysaccharide-23 07/16/2016   Td 10/20/1996, 04/14/2008   Tdap 10/21/2011, 11/19/2022   Zoster Recombinat (Shingrix) 08/04/2019, 11/16/2019, 07/03/2021   Zoster, Live 10/07/2011    TDAP status: Up to date  Flu Vaccine status: Up to date  Pneumococcal vaccine status: Up to date  Covid-19 vaccine status: Completed vaccines  Qualifies for Shingles Vaccine? Yes   Zostavax completed Yes   Shingrix Completed?: Yes  Screening Tests Health Maintenance  Topic Date Due   COVID-19 Vaccine (4 - 2023-24 season) 06/20/2022    INFLUENZA VACCINE  05/21/2023   Medicare Annual Wellness (AWV)  03/01/2024   COLONOSCOPY (Pts 45-20yrs Insurance coverage will need to be confirmed)  08/04/2024   MAMMOGRAM  02/15/2025   DTaP/Tdap/Td (5 - Td or Tdap) 11/19/2032   Pneumonia Vaccine 23+ Years old  Completed   DEXA SCAN  Completed   Zoster Vaccines- Shingrix  Completed   Hepatitis C Screening  Addressed   HPV VACCINES  Aged Out    Health Maintenance  Health Maintenance Due  Topic Date Due   COVID-19 Vaccine (4 - 2023-24 season) 06/20/2022    Colorectal cancer screening: Type of screening: Colonoscopy. Completed 08/04/14. Repeat every 10 years  Mammogram status: Completed 02/16/23. Repeat every year  Bone Density status: Completed 04/08/19. Results reflect: Bone density results: OSTEOPENIA. Repeat every 2 years.   Additional Screening:  Hepatitis C Screening: Completed 09/17/15  Vision Screening: Recommended annual ophthalmology exams for early detection of glaucoma and other disorders of the eye. Is the  patient up to date with their annual eye exam?  Yes  Who is the provider or what is the name of the office in which the patient attends annual eye exams? Dr Antony Contras  If pt is not established with a provider, would they like to be referred to a provider to establish care? No .   Dental Screening: Recommended annual dental exams for proper oral hygiene  Community Resource Referral / Chronic Care Management: CRR required this visit?  No   CCM required this visit?  No      Plan:     I have personally reviewed and noted the following in the patient's chart:   Medical and social history Use of alcohol, tobacco or illicit drugs  Current medications and supplements including opioid prescriptions. Patient is not currently taking opioid prescriptions. Functional ability and status Nutritional status Physical activity Advanced directives List of other physicians Hospitalizations, surgeries, and ER  visits in previous 12 months Vitals Screenings to include cognitive, depression, and falls Referrals and appointments  In addition, I have reviewed and discussed with patient certain preventive protocols, quality metrics, and best practice recommendations. A written personalized care plan for preventive services as well as general preventive health recommendations were provided to patient.     Marzella Schlein, LPN   [id number]   Nurse Notes: pt stated she would be interested in pelvic floor therapy and or medication to address urgency and the inability to hold urine at times please advise

## 2023-03-12 DIAGNOSIS — Z0100 Encounter for examination of eyes and vision without abnormal findings: Secondary | ICD-10-CM | POA: Diagnosis not present

## 2023-03-30 ENCOUNTER — Encounter: Payer: Self-pay | Admitting: Physician Assistant

## 2023-03-30 NOTE — Telephone Encounter (Signed)
Please see pt msg and advise if pt needs appt to discuss

## 2023-03-31 ENCOUNTER — Other Ambulatory Visit: Payer: Self-pay

## 2023-03-31 DIAGNOSIS — E782 Mixed hyperlipidemia: Secondary | ICD-10-CM

## 2023-04-02 ENCOUNTER — Other Ambulatory Visit (INDEPENDENT_AMBULATORY_CARE_PROVIDER_SITE_OTHER): Payer: Medicare HMO

## 2023-04-02 ENCOUNTER — Other Ambulatory Visit: Payer: Self-pay | Admitting: Physician Assistant

## 2023-04-02 DIAGNOSIS — E782 Mixed hyperlipidemia: Secondary | ICD-10-CM

## 2023-04-02 LAB — LIPID PANEL
Cholesterol: 208 mg/dL — ABNORMAL HIGH (ref 0–200)
HDL: 55.6 mg/dL (ref >39.00)
LDL Cholesterol: 122 mg/dL — ABNORMAL HIGH (ref 0–99)
NonHDL: 152.69
Total CHOL/HDL Ratio: 4
Triglycerides: 153 mg/dL — ABNORMAL HIGH (ref 0.0–149.0)
VLDL: 30.6 mg/dL (ref 0.0–40.0)

## 2023-04-02 MED ORDER — EZETIMIBE 10 MG PO TABS: 10.0000 mg | ORAL_TABLET | Freq: Every day | ORAL | 2 refills | Status: DC

## 2023-04-17 ENCOUNTER — Encounter: Payer: Self-pay | Admitting: Physician Assistant

## 2023-04-21 ENCOUNTER — Encounter: Payer: Self-pay | Admitting: Physician Assistant

## 2023-04-21 NOTE — Telephone Encounter (Signed)
Please see pt msg and advise 

## 2023-04-21 NOTE — Telephone Encounter (Signed)
Please be advised pt refused appt since going out of town

## 2023-06-06 ENCOUNTER — Emergency Department (HOSPITAL_BASED_OUTPATIENT_CLINIC_OR_DEPARTMENT_OTHER): Payer: Medicare HMO

## 2023-06-06 ENCOUNTER — Emergency Department (HOSPITAL_BASED_OUTPATIENT_CLINIC_OR_DEPARTMENT_OTHER)
Admission: EM | Admit: 2023-06-06 | Discharge: 2023-06-06 | Disposition: A | Discharge status: Home / Self Care | Payer: Medicare HMO | Source: Home / Self Care | Attending: Emergency Medicine | Admitting: Emergency Medicine

## 2023-06-06 ENCOUNTER — Encounter (HOSPITAL_BASED_OUTPATIENT_CLINIC_OR_DEPARTMENT_OTHER): Payer: Self-pay | Admitting: Emergency Medicine

## 2023-06-06 DIAGNOSIS — R591 Generalized enlarged lymph nodes: Secondary | ICD-10-CM | POA: Diagnosis not present

## 2023-06-06 DIAGNOSIS — M542 Cervicalgia: Secondary | ICD-10-CM | POA: Diagnosis not present

## 2023-06-06 DIAGNOSIS — M4802 Spinal stenosis, cervical region: Secondary | ICD-10-CM

## 2023-06-06 DIAGNOSIS — M4722 Other spondylosis with radiculopathy, cervical region: Secondary | ICD-10-CM

## 2023-06-06 DIAGNOSIS — M5412 Radiculopathy, cervical region: Secondary | ICD-10-CM | POA: Diagnosis not present

## 2023-06-06 DIAGNOSIS — R590 Localized enlarged lymph nodes: Secondary | ICD-10-CM | POA: Diagnosis not present

## 2023-06-06 DIAGNOSIS — R221 Localized swelling, mass and lump, neck: Secondary | ICD-10-CM | POA: Diagnosis not present

## 2023-06-06 DIAGNOSIS — M5011 Cervical disc disorder with radiculopathy,  high cervical region: Secondary | ICD-10-CM | POA: Diagnosis not present

## 2023-06-06 LAB — BASIC METABOLIC PANEL
Anion gap: 7 (ref 5–15)
BUN: 15 mg/dL (ref 8–23)
CO2: 27 mmol/L (ref 22–32)
Calcium: 9.5 mg/dL (ref 8.9–10.3)
Chloride: 102 mmol/L (ref 98–111)
Creatinine, Ser: 0.65 mg/dL (ref 0.44–1.00)
GFR, Estimated: >60 mL/min (ref >60)
Glucose, Bld: 88 mg/dL (ref 70–99)
Potassium: 4.4 mmol/L (ref 3.5–5.1)
Sodium: 136 mmol/L (ref 135–145)

## 2023-06-06 LAB — CBC WITH DIFFERENTIAL/PLATELET
Abs Immature Granulocytes: 0.03 10*3/uL (ref 0.00–0.07)
Basophils Absolute: 0 10*3/uL (ref 0.0–0.1)
Basophils Relative: 0 %
Eosinophils Absolute: 0.2 10*3/uL (ref 0.0–0.5)
Eosinophils Relative: 2 %
HCT: 40.1 % (ref 36.0–46.0)
Hemoglobin: 13.6 g/dL (ref 12.0–15.0)
Immature Granulocytes: 0 %
Lymphocytes Relative: 27 %
Lymphs Abs: 1.9 10*3/uL (ref 0.7–4.0)
MCH: 32.8 pg (ref 26.0–34.0)
MCHC: 33.9 g/dL (ref 30.0–36.0)
MCV: 96.6 fL (ref 80.0–100.0)
Monocytes Absolute: 0.9 10*3/uL (ref 0.1–1.0)
Monocytes Relative: 13 %
Neutro Abs: 4.1 10*3/uL (ref 1.7–7.7)
Neutrophils Relative %: 58 %
Platelets: 219 10*3/uL (ref 150–400)
RBC: 4.15 MIL/uL (ref 3.87–5.11)
RDW: 12.2 % (ref 11.5–15.5)
WBC: 7.1 10*3/uL (ref 4.0–10.5)
nRBC: 0 % (ref 0.0–0.2)

## 2023-06-06 LAB — TROPONIN I (HIGH SENSITIVITY): Troponin I (High Sensitivity): 4 ng/L (ref <18)

## 2023-06-06 MED ORDER — IBUPROFEN 600 MG PO TABS: 600.0000 mg | ORAL_TABLET | Freq: Four times a day (QID) | ORAL | 0 refills | Status: DC | PRN

## 2023-06-06 MED ORDER — KETOROLAC TROMETHAMINE 30 MG/ML IJ SOLN
30.0000 mg | Freq: Once | INTRAMUSCULAR | Status: AC
  Administered 2023-06-06: 30 mg via INTRAVENOUS
  Filled 2023-06-06: qty 1

## 2023-06-06 MED ORDER — PREDNISONE 20 MG PO TABS
20.0000 mg | ORAL_TABLET | Freq: Once | ORAL | Status: AC
  Administered 2023-06-06: 20 mg via ORAL
  Filled 2023-06-06: qty 1

## 2023-06-06 MED ORDER — OXYCODONE HCL 5 MG PO TABS: 5.0000 mg | ORAL_TABLET | Freq: Four times a day (QID) | ORAL | 0 refills | Status: AC | PRN

## 2023-06-06 MED ORDER — PREDNISONE 20 MG PO TABS: 20.0000 mg | ORAL_TABLET | Freq: Every day | ORAL | 0 refills | Status: AC

## 2023-06-06 MED ORDER — KETOROLAC TROMETHAMINE 30 MG/ML IJ SOLN: 30.0000 mg | Freq: Once | INTRAMUSCULAR | Status: DC

## 2023-06-06 NOTE — ED Provider Notes (Signed)
[place] EMERGENCY DEPARTMENT AT [facility] Provider Note   CSN: [id number] Arrival date & time: 06/06/23  0746     History {Add pertinent medical, surgical, social history, OB history to HPI:1} Chief Complaint  Patient presents with   Neck Pain    [name] is a 72 y.o. female.  Patient is a 72 year old female past medical history as seen below presenting for complaints of neck pain.  Patient admits to bilateral neck pain that started yesterday with radiation to the right shoulder, worse with movement or palpation.  Patient states her pain was so severe she had difficulty sleeping last night.  Patient denies any fall or neck trauma.  States she played golf yesterday without difficulty.  Patient also states that she developed a occipital right-sided lymph node and a posterior left-sided lymph node.  States she put warm compress of lymph nodes in the right occipital lymph node has gone away.  She states she has itching on the left sided posterior cervical lymph node.  She denies any fevers, chills, allergies, ear pain, ear drainage, nasal congestion, rhinorrhea, coughing, sinus pain.  The history is provided by the patient. No language interpreter was used.  Neck Pain Associated symptoms: no chest pain and no fever        Home Medications Prior to Admission medications   Medication Sig Start Date End Date Taking? Authorizing Provider  ezetimibe (ZETIA) 10 MG tablet Take 1 tablet (10 mg total) by mouth daily. 04/02/23   Allwardt, Alyssa M, PA-C  fluticasone (FLONASE) 50 MCG/ACT nasal spray Place 1 spray into both nostrils daily.    [provider]  lisinopril (ZESTRIL) 20 MG tablet TAKE 1 TABLET BY MOUTH EVERY DAY 02/06/23   Allwardt, Alyssa M, PA-C  Multiple Vitamins-Minerals (HAIR/SKIN/NAILS/BIOTIN PO) Take 1 capsule by mouth daily.    [provider]  Multiple Vitamins-Minerals (MULTIVITAMIN PO) Take 1 tablet by mouth daily.    [provider]   naproxen sodium (ALEVE) 220 MG tablet Take 220 mg by mouth daily as needed (pain).    [provider]  Probiotic Product (PROBIOTIC PO) Take 1 capsule by mouth daily.    [provider]  valACYclovir (VALTREX) 500 MG tablet TAKE 1 TABLET BY MOUTH TWICE A DAY AS NEEDED 10/08/21   Allwardt, Alyssa M, PA-C      Allergies    Statins, Sulfonamide derivatives, and Cefdinir    Review of Systems   Review of Systems  Constitutional:  Negative for chills and fever.  HENT:  Negative for ear pain and sore throat.   Eyes:  Negative for pain and visual disturbance.  Respiratory:  Negative for cough and shortness of breath.   Cardiovascular:  Negative for chest pain and palpitations.  Gastrointestinal:  Negative for abdominal pain and vomiting.  Genitourinary:  Negative for dysuria and hematuria.  Musculoskeletal:  Positive for neck pain. Negative for arthralgias and back pain.  Skin:  Negative for color change and rash.  Neurological:  Negative for seizures and syncope.  All other systems reviewed and are negative.   Physical Exam Updated Vital Signs Temp 97.8 F (36.6 C) (Oral)   Ht 5\' 5"  (1.651 m)   Wt 72.6 kg   LMP 10/20/2000   BMI 26.63 kg/m  Physical Exam Vitals and nursing note reviewed.  Constitutional:      General: She is not in acute distress.    Appearance: She is well-developed.  HENT:     Head: Normocephalic and atraumatic. Mass  present.   Eyes:     Conjunctiva/sclera: Conjunctivae normal.  Cardiovascular:     Rate and Rhythm: Normal rate and regular rhythm.     Pulses:          Radial pulses are 2+ on the right side and 2+ on the left side.     Heart sounds: No murmur heard. Pulmonary:     Effort: Pulmonary effort is normal. No respiratory distress.     Breath sounds: Normal breath sounds.  Abdominal:     Palpations: Abdomen is soft.     Tenderness: There is no abdominal tenderness.  Musculoskeletal:        General: No swelling.     Cervical  back: Neck supple. Tenderness present. No swelling, edema, deformity, erythema or bony tenderness.     Thoracic back: No bony tenderness.       Back:  Skin:    General: Skin is warm and dry.     Capillary Refill: Capillary refill takes less than 2 seconds.  Neurological:     Mental Status: She is alert.     Sensory: Sensation is intact.     Motor: Motor function is intact.  Psychiatric:        Mood and Affect: Mood normal.     ED Results / Procedures / Treatments   Labs (all labs ordered are listed, but only abnormal results are displayed) Labs Reviewed  CBC WITH DIFFERENTIAL/PLATELET  BASIC METABOLIC PANEL  TROPONIN I (HIGH SENSITIVITY)    EKG None  Radiology No results found.  Procedures Procedures  {Document cardiac monitor, telemetry assessment procedure when appropriate:1}  Medications Ordered in ED Medications  ketorolac (TORADOL) 30 MG/ML injection 30 mg (has no administration in time range)    ED Course/ Medical Decision Making/ A&P   {   Click here for ABCD2, HEART and other calculatorsREFRESH Note before signing :1}                              Medical Decision Making Amount and/or Complexity of Data Reviewed Labs: ordered. Radiology: ordered.  Risk Prescription drug management.   72 year old female presenting for neck pain and likely lymphadenopathy.  Patient denies any infectious signs or symptoms.  On exam she has a 2 x 2 cm posterior cervical lymph node that is tender to palpation and mildly erythematous.  She admits putting heat compresses over it likely resulting in the erythema and pain.  Onset began yesterday.  Less likely to be an abscess or any other etiologies.  Will get ultrasound to confirm imaging.  Patient also admits to occipital mass that has now resolved again likely a lymph node.  Patient does not have a history of a left-sided supraclavicular lipoma resection.  Unlikely to be a lipoma at this time secondary to its presentation within  24 hours.  As for neck pain with radiation to the right shoulder likely musculoskeletal versus pain radiating pain from the lymph nodes.  EKG stable.  Troponin stable.  Less likely cardiac etiology.  CT cervical spine demonstrates no disc bulges at this time.  Toradol given for pain  {Document critical care time when appropriate:1} {Document review of labs and clinical decision tools ie heart score, Chads2Vasc2 etc:1}  {Document your independent review of radiology images, and any outside records:1} {Document your discussion with family members, caretakers, and with consultants:1} {Document social determinants of health affecting pt's care:1} {Document your decision making why or why  not admission, treatments were needed:1} Final Clinical Impression(s) / ED Diagnoses Final diagnoses:  None    Rx / DC Orders ED Discharge Orders     None

## 2023-06-06 NOTE — ED Triage Notes (Signed)
Pt c/o pain in both sides of the back of her neck, radiating down to her shoulder on the R side. Started about a week ago. Also, states her lymph nodes are swollen. States it has worsened over night and been constant.

## 2023-06-06 NOTE — ED Notes (Signed)
Pt given discharge instructions and reviewed prescriptions. Opportunities given for questions. Pt verbalizes understanding. PIV removed x1. Pt states her BP is always high when at healthcare facility and will take her BP medication when she gets home. Jillyn Hidden, RN

## 2023-06-10 ENCOUNTER — Ambulatory Visit: Payer: Medicare HMO | Attending: Family | Admitting: Rehabilitative and Restorative Service Providers"

## 2023-06-10 ENCOUNTER — Other Ambulatory Visit: Payer: Self-pay

## 2023-06-10 ENCOUNTER — Ambulatory Visit: Payer: Medicare HMO | Admitting: Family

## 2023-06-10 ENCOUNTER — Encounter: Payer: Self-pay | Admitting: Rehabilitative and Restorative Service Providers"

## 2023-06-10 VITALS — BP 173/92 | HR 60 | Temp 97.8°F | Ht 65.0 in | Wt 165.2 lb

## 2023-06-10 DIAGNOSIS — R252 Cramp and spasm: Secondary | ICD-10-CM | POA: Diagnosis not present

## 2023-06-10 DIAGNOSIS — M5030 Other cervical disc degeneration, unspecified cervical region: Secondary | ICD-10-CM | POA: Insufficient documentation

## 2023-06-10 DIAGNOSIS — I10 Essential (primary) hypertension: Secondary | ICD-10-CM

## 2023-06-10 DIAGNOSIS — M6281 Muscle weakness (generalized): Secondary | ICD-10-CM | POA: Diagnosis not present

## 2023-06-10 DIAGNOSIS — M542 Cervicalgia: Secondary | ICD-10-CM | POA: Insufficient documentation

## 2023-06-10 MED ORDER — CYCLOBENZAPRINE HCL 10 MG PO TABS
10.0000 mg | ORAL_TABLET | Freq: Three times a day (TID) | ORAL | 1 refills | Status: DC | PRN
Start: 2023-06-10 — End: 2023-07-08

## 2023-06-10 MED ORDER — LISINOPRIL 20 MG PO TABS
20.0000 mg | ORAL_TABLET | Freq: Two times a day (BID) | ORAL | 1 refills | Status: DC
Start: 2023-06-10 — End: 2023-07-30

## 2023-06-10 NOTE — Progress Notes (Signed)
Patient ID: [name], female    DOB: Feb 13, 1951, 72 y.o.   MRN: [id number]  Chief Complaint  Patient presents with   Follow-up    Pt c/o throbbing neck pain down to right shoulder, seen in ED on 8/17. Has tried Ibuprofen, hydrocodone and prednisone which did help sx.    HPI: Neck pain:  thinks pain started after doing a lot of yard work after storm, and then had family in town and did a lot of cleaning, then played golf and the next morning she was in a lot of pain. pt golfs with a friend who recommends PT w/Stacy Simpson for therapy and needling.   Hypertension: Patient is currently maintained on the following medications for blood pressure: Lisinopril 20mg  every day. Failed meds include: none Patient reports good compliance with blood pressure medications. Patient denies chest pain, headaches, shortness of breath or swelling. Last 3 blood pressure readings in our office are as follows: BP Readings from Last 3 Encounters:  06/10/23 (!) 173/92  06/06/23 (!) 182/67  03/02/23 (!) 140/74    Assessment & Plan:  DDD (degenerative disc disease), cervical- w/foraminal impingement on C3-4 on L side & C5-6 on right side; seen in ER & given Toradol shot, prednisone 20mg  every day, states she feels much better but has trouble overnight. Sending generic Flexeril 10mg  tid, prn or at bedtime, advised on use & SE. Also sending PT referral.  Cyclobenzaprine HCl; Take 1 tablet (10 mg total) by mouth 3 (three) times daily as needed for muscle spasms.  Dispense: 30 tablet; Refill: 1 - Ambulatory referral to Physical Therapy  Essential hypertension- high today, denies sx; pt has had a few high readings at home as well, seen in ER last week and BP very high, could not leave until lowered. Pt has had pain as well, but none today. Advised she take an extra Lisinopril when she gets home, and start taking it bid for the next week, monitor BP and let PCP know if running <110/60 or still higher than  135/90.  -     Lisinopril; Take 1 tablet (20 mg total) by mouth 2 (two) times daily.  Dispense: 180 tablet; Refill: 1  Subjective:    Outpatient Medications Prior to Visit  Medication Sig Dispense Refill   ezetimibe (ZETIA) 10 MG tablet Take 1 tablet (10 mg total) by mouth daily. 30 tablet 2   fluticasone (FLONASE) 50 MCG/ACT nasal spray Place 1 spray into both nostrils daily.     ibuprofen (ADVIL) 600 MG tablet Take 1 tablet (600 mg total) by mouth every 6 (six) hours as needed. 30 tablet 0   Multiple Vitamins-Minerals (HAIR/SKIN/NAILS/BIOTIN PO) Take 1 capsule by mouth daily.     Multiple Vitamins-Minerals (MULTIVITAMIN PO) Take 1 tablet by mouth daily.     naproxen sodium (ALEVE) 220 MG tablet Take 220 mg by mouth daily as needed (pain).     oxycodone (OXY-IR) 5 MG capsule Take 5 mg by mouth every 4 (four) hours as needed.     predniSONE (DELTASONE) 20 MG tablet Take 1 tablet (20 mg total) by mouth daily for 5 days. 5 tablet 0   Probiotic Product (PROBIOTIC PO) Take 1 capsule by mouth daily.     valACYclovir (VALTREX) 500 MG tablet TAKE 1 TABLET BY MOUTH TWICE A DAY AS NEEDED 30 tablet 12   lisinopril (ZESTRIL) 20 MG tablet TAKE 1 TABLET BY MOUTH EVERY DAY 90 tablet 1   No facility-administered medications prior to visit.  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   Hypertension    PONV (postoperative nausea and vomiting)    Seasonal allergies    Spider veins 11/23/2013   Past Surgical History:  Procedure Laterality Date    BREAST BIOPSY     left   COLONOSCOPY  2005   Dr Victorino Dike   DENTAL SURGERY     Cyst on right bottom tooth   SUPRACLAVICAL NODE BIOPSY Left 09/01/2022   Procedure: EXCISION LIPOMA LEFT SUPRACLAVICAL FOSSA;  Surgeon: Loreli Slot, MD;  Location: [facility] OR;  Service: Thoracic;  Laterality: Left;   TOE SURGERY     TONSILLECTOMY AND ADENOIDECTOMY     UPPER GASTROINTESTINAL ENDOSCOPY     Allergies  Allergen Reactions   Statins Other (See Comments)    myalgias   Sulfonamide Derivatives Hives   Cefdinir Rash      Objective:    Physical Exam Vitals and nursing note reviewed.  Constitutional:      Appearance: Normal appearance.  Cardiovascular:     Rate and Rhythm: Normal rate and regular rhythm.  Pulmonary:     Effort: Pulmonary effort is normal.     Breath sounds: Normal breath sounds.  Musculoskeletal:     Cervical back: No edema. Pain with movement (mild) and muscular tenderness (mild) present.  Skin:    General: Skin is warm and dry.  Neurological:     Mental Status: She is alert.  Psychiatric:        Mood and Affect: Mood normal.        Behavior: Behavior normal.    BP (!) 188/78   Pulse 60   Temp 97.8 F (36.6 C) (Temporal)   Ht 5\' 5"  (1.651 m)   Wt 165 lb 4 oz (75 kg)   LMP 10/20/2000   SpO2 98%   BMI 27.50 kg/m  Wt Readings from Last 3 Encounters:  06/10/23 165 lb 4 oz (75 kg)  06/06/23 160 lb (72.6 kg)  03/02/23 163 lb 9.6 oz (74.2 kg)       Dulce Sellar, NP

## 2023-06-10 NOTE — Therapy (Signed)
OUTPATIENT PHYSICAL THERAPY CERVICAL EVALUATION   Patient Name: [name] MRN: [id number] DOB:04/09/1951, 72 y.o., female Today's Date: 06/10/2023  END OF SESSION:  PT End of Session - 06/10/23 1145     Visit Number 1    Date for PT Re-Evaluation 07/31/23    Authorization Type [employer]    Progress Note Due on Visit 10    PT Start Time 1139    PT Stop Time 1220    PT Time Calculation (min) 41 min    Activity Tolerance Patient tolerated treatment well    Behavior During Therapy WFL for tasks assessed/performed             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   Hypertension    PONV (postoperative nausea and vomiting)    Seasonal allergies    Spider veins 11/23/2013   Past Surgical History:  Procedure Laterality Date   BREAST BIOPSY     left   COLONOSCOPY  2005   Dr Victorino Dike   DENTAL SURGERY     Cyst on right bottom tooth   SUPRACLAVICAL NODE BIOPSY Left 09/01/2022   Procedure: EXCISION LIPOMA LEFT SUPRACLAVICAL FOSSA;  Surgeon: Loreli Slot, MD;  Location: [facility] OR;  Service: Thoracic;  Laterality: Left;   TOE SURGERY     TONSILLECTOMY AND ADENOIDECTOMY     UPPER GASTROINTESTINAL ENDOSCOPY     Patient Active Problem List   Diagnosis Date Noted   Lipoma 12/24/2021   Essential hypertension 09/28/2019   Hiatal hernia 08/15/2019   Osteoarthritis of knee 08/15/2019   Hyperlipidemia 05/17/2018   Senile purpura (HCC) 06/08/2017   Seasonal allergies 09/17/2015   GAD (generalized anxiety  disorder) 03/12/2015   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   VARICOSE VEIN 01/14/2008   GERD 07/14/2007    PCP: Allwardt, Crist Infante, PA-C  REFERRING PROVIDER: Dulce Sellar, NP  REFERRING DIAG: M50.30 (ICD-10-CM) - DDD (degenerative disc disease), cervical  THERAPY DIAG:  Cervicalgia  Cramp and spasm  Muscle weakness (generalized)  Rationale for Evaluation and Treatment: Rehabilitation  ONSET DATE: ED visit on 06/07/2023  SUBJECTIVE:  SUBJECTIVE STATEMENT: Patient has been noticing increased cervical pain since doing some increased yard work, cleaning, and playing golf.  Patient treated in ED on 06/06/2023 and was prescribed hydrodone and prednisone which did help some.  Pt went to PCP office this morning and she was recommended PT and hoping for dry needling.  Hand dominance: Right  PERTINENT HISTORY:  HTN, DDD  PAIN:  Are you having pain? Yes: NPRS scale: 0-4/10 Pain location: cervical and into right shoulder Pain description: throbbing Aggravating factors: worse at night when she lies down at night Relieving factors: medication, heat pack  PRECAUTIONS: None  RED FLAGS: None     WEIGHT BEARING RESTRICTIONS: No  FALLS:  Has patient fallen in last 6 months? 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  NEXT MD VISIT: Mar 08, 2023 for annual check-up  OBJECTIVE:   DIAGNOSTIC FINDINGS:  Cervical CT scan on  06/06/2023: IMPRESSION: Generalized cervical spine degeneration with foraminal impingement on the left at C3-4 and right at C5-6.  PATIENT SURVEYS:  Eval:  FOTO 61 (projected 75 by visit 10)  COGNITION: Overall cognitive status: Within functional limits for tasks assessed  SENSATION: Pt denies numbness and tingling  POSTURE: No Significant postural limitations  PALPATION: Tightness noted bilateral upper traps with spasms noted   CERVICAL ROM:   Active ROM A/PROM (deg) eval  Flexion 50  Extension 50  Right lateral flexion 25  Left lateral flexion 35  Right rotation 44  Left rotation 50   (Blank rows = not tested)  UPPER EXTREMITY ROM:  Eval:  WFL  UPPER EXTREMITY MMT:  Eval:   Left shoulder is WFL Right shoulder is grossly 4+ to 5-/5 throughout  CERVICAL SPECIAL TESTS:  No change in pain with cervical compression and distraction   TODAY'S TREATMENT:                                                                                                                              DATE: 06/10/2023 Reviewed HEP (see below) Trigger Point Dry-Needling  Treatment instructions: Expect mild to moderate muscle soreness. S/S of pneumothorax if dry needled over a lung field, and to seek immediate medical attention should they occur. Patient verbalized understanding of these instructions and education. Patient Consent Given: Yes Education handout provided: Yes Muscles treated: right suboccipital, right upper trap, right rhomboid Electrical stimulation performed: No Parameters: N/A Treatment response/outcome: Utilized skilled palpation to identify bony landmarks and trigger points.  Able to illicit twitch response and muscle elongation.  Soft tissue mobilization following to further promote tissue elongation.    PATIENT EDUCATION:  Education details: Issued  HEP Person educated: Patient Education method: Explanation, Demonstration, and Handouts Education comprehension: verbalized  understanding  HOME EXERCISE PROGRAM: Access Code: [email or web address] URL: [email or web address] Date: 06/10/2023 Prepared by: Reather Laurence  Exercises - Seated Scapular Retraction  - 1-2 x daily - 7 x weekly - 2 sets - 10 reps - Seated Cervical Rotation AROM  - 1-2 x daily - 7 x weekly - 2 sets - 10 reps - Seated Cervical Sidebending AROM  - 1-2 x daily - 7 x weekly - 2 sets - 10 reps  ASSESSMENT:  CLINICAL IMPRESSION: Patient is a 72 y.o. female who was seen today for physical therapy evaluation and treatment for cervical DDD.  Ms Delis reports that her PLOF was able to go golfing 3x/week without increased pain or cervical tightness.  Patient presents with slight shoulder weakness, increased pain, muscle spasms, and difficulty performing functional tasks.  Able to initiate dry needling and manual therapy during session today with patient reporting improvements following.  Measured cervical rotation and side-bending after manual therapy and each motion had improved at least 8 degrees post-treatment.  Pt reported feeling decreased pain and tightness following.  Patient would benefit from skilled PT to address her functional impairments to allow her to return to active lifestyle without increased pain.  OBJECTIVE IMPAIRMENTS: decreased ROM, decreased strength, increased muscle spasms, and pain.   ACTIVITY LIMITATIONS: lifting, sleeping, and reach over head  PARTICIPATION LIMITATIONS: cleaning, community activity, and yard work  PERSONAL FACTORS: Past/current experiences and 1 comorbidity: cervical DDD, HTN  are also affecting patient's functional outcome.   REHAB POTENTIAL: Good  CLINICAL DECISION MAKING: Stable/uncomplicated  EVALUATION COMPLEXITY: Low   GOALS: Goals reviewed with patient? Yes  SHORT TERM GOALS: Target date: 07/03/2023  Pt will be independent with initial HEP. Baseline:  Goal status: INITIAL  2.  Pt will report at least a 25% improvement in symptoms/pain  since starting PT. Baseline:  Goal status: INITIAL   LONG TERM GOALS: Target date: 07/31/2023  Pt will be independent with advanced HEP to allow for self-progression following discharge. Baseline:  Goal status: INITIAL  2.  Pt will increase FOTO to 75 to demonstrate improvements in functional mobility/tasks. Baseline: 61 Goal status: INITIAL  3.  Pt will return to being able to play golf without increased cervical pain. Baseline:  Goal status: INITIAL  4.  Patient to increase cervical A/ROM to [facility] without pain to allow for driving and playing golf. Baseline:  Goal status: INITIAL  5.  Patient to improve right shoulder strength to [facility] to allow her to lift objects and carry in grocery bags and golf bag without difficulty. Baseline:  Goal status: INITIAL    PLAN:  PT FREQUENCY: 1-2x/week  PT DURATION: 8 weeks  PLANNED INTERVENTIONS: Therapeutic exercises, Therapeutic activity, Neuromuscular re-education, Balance training, Gait training, Patient/Family education, Self Care, Joint mobilization, Joint manipulation, Vestibular training, Canalith repositioning, Aquatic Therapy, Dry Needling, Electrical stimulation, Spinal manipulation, Spinal mobilization, Cryotherapy, Moist heat, Taping, Traction, Ultrasound, Ionotophoresis 4mg /ml Dexamethasone, Manual therapy, and Re-evaluation  PLAN FOR NEXT SESSION: Assess and progress HEP as indicated, dry needling/manual therapy as indicated, strengthening, flexibility   Reather Laurence, PT, DPT 06/10/23, 1:29 PM  [facility] [address] [place], [place] [address] Phone # [phone] Fax [phone]

## 2023-06-10 NOTE — Patient Instructions (Signed)

## 2023-06-12 ENCOUNTER — Telehealth: Payer: Self-pay

## 2023-06-12 NOTE — Telephone Encounter (Signed)
Transition Care Management Unsuccessful Follow-up Telephone Call  Date of discharge and from where:  06/06/2023 [facility]  Attempts:  1st Attempt  Reason for unsuccessful TCM follow-up call:  Left voice message  Lurline Caver [facility]  [facility] Population Health Community Resource Care Guide   [email or web address]  ?? [phone]   Website: [email or web address]  [email or web address]

## 2023-06-12 NOTE — Telephone Encounter (Signed)
Transition Care Management Follow-up Telephone Call Date of discharge and from where: 06/06/2023 [facility] How have you been since you were released from the hospital? Patient stated she is feeling much better. Any questions or concerns? No  Items Reviewed: Did the pt receive and understand the discharge instructions provided? Yes  Medications obtained and verified? Yes  Other? No  Any new allergies since your discharge? No  Dietary orders reviewed? Yes Do you have support at home? Yes   Follow up appointments reviewed:  PCP Hospital f/u appt confirmed? Yes  Scheduled to see Dulce Sellar, NP on 06/10/2023 @ [facility]. Specialist Hospital f/u appt confirmed? Yes  Scheduled to see Reather Laurence, PT on 06/10/2023 @ [facility]. Are transportation arrangements needed? No  If their condition worsens, is the pt aware to call PCP or go to the Emergency Dept.? Yes Was the patient provided with contact information for the PCP's office or ED? Yes Was to pt encouraged to call back with questions or concerns? Yes  Celsey Asselin [facility]  [facility] Population Health Community Resource Care Guide   [email or web address]  ?? [phone]   Website: [email or web address]  [email or web address]

## 2023-06-16 ENCOUNTER — Ambulatory Visit: Payer: Medicare HMO | Admitting: Rehabilitative and Restorative Service Providers"

## 2023-06-16 ENCOUNTER — Encounter: Payer: Self-pay | Admitting: Rehabilitative and Restorative Service Providers"

## 2023-06-16 DIAGNOSIS — M542 Cervicalgia: Secondary | ICD-10-CM

## 2023-06-16 DIAGNOSIS — R252 Cramp and spasm: Secondary | ICD-10-CM | POA: Diagnosis not present

## 2023-06-16 DIAGNOSIS — M6281 Muscle weakness (generalized): Secondary | ICD-10-CM | POA: Diagnosis not present

## 2023-06-16 DIAGNOSIS — M5030 Other cervical disc degeneration, unspecified cervical region: Secondary | ICD-10-CM | POA: Diagnosis not present

## 2023-06-16 NOTE — Therapy (Signed)
OUTPATIENT PHYSICAL THERAPY TREATMENT NOTE   Patient Name: [name] MRN: [id number] DOB:1951-07-10, 72 y.o., female Today's Date: 06/16/2023  END OF SESSION:  PT End of Session - 06/16/23 1446     Visit Number 2    Date for PT Re-Evaluation 07/31/23    Authorization Type [employer]    Progress Note Due on Visit 10    PT Start Time 1445    PT Stop Time 1525    PT Time Calculation (min) 40 min    Activity Tolerance Patient tolerated treatment well    Behavior During Therapy WFL for tasks assessed/performed             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   Hypertension    PONV (postoperative nausea and vomiting)    Seasonal allergies    Spider veins 11/23/2013   Past Surgical History:  Procedure Laterality Date   BREAST BIOPSY     left   COLONOSCOPY  2005   Dr Victorino Dike   DENTAL SURGERY     Cyst on right bottom tooth   SUPRACLAVICAL NODE BIOPSY Left 09/01/2022   Procedure: EXCISION LIPOMA LEFT SUPRACLAVICAL FOSSA;  Surgeon: Loreli Slot, MD;  Location: [facility] OR;  Service: Thoracic;  Laterality: Left;   TOE SURGERY     TONSILLECTOMY AND ADENOIDECTOMY     UPPER GASTROINTESTINAL ENDOSCOPY     Patient Active Problem List   Diagnosis Date Noted   Lipoma 12/24/2021   Essential hypertension 09/28/2019   Hiatal hernia 08/15/2019   Osteoarthritis of knee 08/15/2019   Hyperlipidemia 05/17/2018   Senile purpura (HCC) 06/08/2017   Seasonal allergies 09/17/2015   GAD (generalized anxiety  disorder) 03/12/2015   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   VARICOSE VEIN 01/14/2008   GERD 07/14/2007    PCP: Allwardt, Crist Infante, PA-C  REFERRING PROVIDER: Dulce Sellar, NP  REFERRING DIAG: M50.30 (ICD-10-CM) - DDD (degenerative disc disease), cervical  THERAPY DIAG:  Cervicalgia  Cramp and spasm  Muscle weakness (generalized)  Rationale for Evaluation and Treatment: Rehabilitation  ONSET DATE: ED visit on 06/07/2023  SUBJECTIVE:  SUBJECTIVE STATEMENT: Patient reports feeling much better stating that she was able to play golf twice since last visit.  Denies current pain.  Hand dominance: Right  PERTINENT HISTORY:  HTN, DDD  PAIN:  Are you having pain? Yes: NPRS scale: 0/10 Pain location: cervical and into right shoulder Pain description: throbbing Aggravating factors: worse at night when she lies down at night Relieving factors: medication, heat pack  PRECAUTIONS: None  RED FLAGS: None     WEIGHT BEARING RESTRICTIONS: No  FALLS:  Has patient fallen in last 6 months? 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  NEXT MD VISIT: Mar 08, 2023 for annual check-up  OBJECTIVE:   DIAGNOSTIC FINDINGS:  Cervical CT scan on 06/06/2023: IMPRESSION: Generalized cervical spine degeneration with foraminal impingement on the left at C3-4 and right at C5-6.  PATIENT SURVEYS:  Eval:  FOTO 61 (projected 75 by visit  10)  COGNITION: Overall cognitive status: Within functional limits for tasks assessed  SENSATION: Pt denies numbness and tingling  POSTURE: No Significant postural limitations  PALPATION: Tightness noted bilateral upper traps with spasms noted   CERVICAL ROM:   Active ROM A/PROM (deg) eval  Flexion 50  Extension 50  Right lateral flexion 25  Left lateral flexion 35  Right rotation 44  Left rotation 50   (Blank rows = not tested)  UPPER EXTREMITY ROM:  Eval:  WFL  UPPER EXTREMITY MMT:  Eval:   Left shoulder is WFL Right shoulder is grossly 4+ to 5-/5 throughout  CERVICAL SPECIAL TESTS:  No change in pain with cervical compression and distraction   TODAY'S TREATMENT:                                                                                                                               DATE: 06/16/2023 UBE level 1.0 x3 min each direction with PT present to discuss status 3 way scapular stabilization with blue loop 2x5 bilat 4D scapular stabilization with 2# light blue ball on wall x20 each bilat Supine serratus punch with 2# 2x10 bilat Supine arm tracing of alphabet with 2# dumbbell A-Z bilat Bent forward over PT mat with 2# dumbbell:  shoulder flexion, shoulder horizontal abduction, shoulder extension.  2x10 each bilat Trigger Point Dry-Needling  Treatment instructions: Expect mild to moderate muscle soreness. S/S of pneumothorax if dry needled over a lung field, and to seek immediate medical attention should they occur. Patient verbalized understanding of these instructions and education. Patient Consent Given: Yes Education handout provided: Yes Muscles treated: right suboccipital, right cervical multifidi, right upper trap, right rhomboid Electrical stimulation performed: No Parameters:  N/A Treatment response/outcome: Utilized skilled palpation to identify bony landmarks and trigger points.  Able to illicit twitch response and muscle elongation.  Soft  tissue mobilization following to further promote tissue elongation.    DATE: 06/10/2023 Reviewed HEP (see below) Trigger Point Dry-Needling  Treatment instructions: Expect mild to moderate muscle soreness. S/S of pneumothorax if dry needled over a lung field, and to seek immediate medical attention should they occur. Patient verbalized understanding of these instructions and education. Patient Consent Given: Yes Education handout provided: Yes Muscles treated: right suboccipital, right upper trap, right rhomboid Electrical stimulation performed: No Parameters: N/A Treatment response/outcome: Utilized skilled palpation to identify bony landmarks and trigger points.  Able to illicit twitch response and muscle elongation.  Soft tissue mobilization following to further promote tissue elongation.    PATIENT EDUCATION:  Education details: Issued HEP Person educated: Patient Education method: Explanation, Facilities manager, and Handouts Education comprehension: verbalized understanding  HOME EXERCISE PROGRAM: Access Code: [email or web address] URL: [email or web address] Date: 06/10/2023 Prepared by: Reather Laurence  Exercises - Seated Scapular Retraction  - 1-2 x daily - 7 x weekly - 2 sets - 10 reps - Seated Cervical Rotation AROM  - 1-2 x daily - 7 x weekly - 2 sets - 10 reps - Seated Cervical Sidebending AROM  - 1-2 x daily - 7 x weekly - 2 sets - 10 reps  ASSESSMENT:  CLINICAL IMPRESSION: Ms Boche presents to skilled PT reporting that she is feeling at least 90% better since initial evaluation and dry needling/manual therapy.  Patient was able to play golf on 2 different occasions since initial evaluation and reports that she did not have any increased pain.  Patient with good participation with exercises and did require cuing for decreased velocity to increase her muscle fiber recruitment.  Patient is on track for early discharge, possibly next visit, if she continues to have decreased  pain with functional tasks.  OBJECTIVE IMPAIRMENTS: decreased ROM, decreased strength, increased muscle spasms, and pain.   ACTIVITY LIMITATIONS: lifting, sleeping, and reach over head  PARTICIPATION LIMITATIONS: cleaning, community activity, and yard work  PERSONAL FACTORS: Past/current experiences and 1 comorbidity: cervical DDD, HTN  are also affecting patient's functional outcome.   REHAB POTENTIAL: Good  CLINICAL DECISION MAKING: Stable/uncomplicated  EVALUATION COMPLEXITY: Low   GOALS: Goals reviewed with patient? Yes  SHORT TERM GOALS: Target date: 07/03/2023  Pt will be independent with initial HEP. Baseline:  Goal status: MET on 06/16/23  2.  Pt will report at least a 25% improvement in symptoms/pain since starting PT. Baseline:  Goal status: MET on 06/16/2023   LONG TERM GOALS: Target date: 07/31/2023  Pt will be independent with advanced HEP to allow for self-progression following discharge. Baseline:  Goal status: Ongoing  2.  Pt will increase FOTO to 75 to demonstrate improvements in functional mobility/tasks. Baseline: 61 Goal status: INITIAL  3.  Pt will return to being able to play golf without increased cervical pain. Baseline:  Goal status: Ongoing  4.  Patient to increase cervical A/ROM to [facility] without pain to allow for driving and playing golf. Baseline:  Goal status: MET on 06/16/2023  5.  Patient to improve right shoulder strength to [facility] to allow her to lift objects and carry in grocery bags and golf bag without difficulty. Baseline:  Goal status: Ongoing    PLAN:  PT FREQUENCY: 1-2x/week  PT DURATION: 8 weeks  PLANNED INTERVENTIONS: Therapeutic exercises, Therapeutic activity, Neuromuscular re-education, Balance training, Gait training, Patient/Family education, Self Care, Joint  mobilization, Joint manipulation, Vestibular training, Canalith repositioning, Aquatic Therapy, Dry Needling, Electrical stimulation, Spinal manipulation, Spinal  mobilization, Cryotherapy, Moist heat, Taping, Traction, Ultrasound, Ionotophoresis 4mg /ml Dexamethasone, Manual therapy, and Re-evaluation  PLAN FOR NEXT SESSION: Possible discharge if goals met   Reather Laurence, PT, DPT 06/16/23, 3:42 PM  [facility] [address] [place], [place] [address] Phone # [phone] Fax [phone]

## 2023-06-28 ENCOUNTER — Other Ambulatory Visit: Payer: Self-pay | Admitting: Physician Assistant

## 2023-07-07 ENCOUNTER — Ambulatory Visit: Payer: Medicare HMO | Attending: Family | Admitting: Rehabilitative and Restorative Service Providers"

## 2023-07-07 ENCOUNTER — Encounter: Payer: Self-pay | Admitting: Rehabilitative and Restorative Service Providers"

## 2023-07-07 DIAGNOSIS — M6281 Muscle weakness (generalized): Secondary | ICD-10-CM | POA: Insufficient documentation

## 2023-07-07 DIAGNOSIS — M542 Cervicalgia: Secondary | ICD-10-CM | POA: Diagnosis not present

## 2023-07-07 DIAGNOSIS — R252 Cramp and spasm: Secondary | ICD-10-CM | POA: Diagnosis not present

## 2023-07-07 NOTE — Therapy (Addendum)
OUTPATIENT PHYSICAL THERAPY TREATMENT NOTE AND LATE ENTRY DISCHARGE SUMMARY   Patient Name: [name] MRN: [id number] DOB:12-08-1950, 72 y.o., female Today's Date: 07/07/2023  END OF SESSION:  PT End of Session - 07/07/23 1058     Visit Number 3    Date for PT Re-Evaluation 07/31/23    Authorization Type [employer] Medicare    Progress Note Due on Visit 10    PT Start Time 1056    PT Stop Time 1135    PT Time Calculation (min) 39 min    Activity Tolerance Patient tolerated treatment well    Behavior During Therapy WFL for tasks assessed/performed             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   Hypertension    PONV (postoperative nausea and vomiting)    Seasonal allergies    Spider veins 11/23/2013   Past Surgical History:  Procedure Laterality Date   BREAST BIOPSY     left   COLONOSCOPY  2005   Dr Victorino Dike   DENTAL SURGERY     Cyst on right bottom tooth   SUPRACLAVICAL NODE BIOPSY Left 09/01/2022   Procedure: EXCISION LIPOMA LEFT SUPRACLAVICAL FOSSA;  Surgeon: Loreli Slot, MD;  Location: [facility] OR;  Service: Thoracic;  Laterality: Left;   TOE SURGERY     TONSILLECTOMY AND ADENOIDECTOMY     UPPER GASTROINTESTINAL ENDOSCOPY     Patient Active Problem List   Diagnosis Date Noted   Lipoma 12/24/2021   Essential hypertension 09/28/2019   Hiatal hernia 08/15/2019   Osteoarthritis of knee 08/15/2019   Hyperlipidemia 05/17/2018   Senile purpura (HCC) 06/08/2017   Seasonal allergies 09/17/2015    GAD (generalized anxiety disorder) 03/12/2015   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   VARICOSE VEIN 01/14/2008   GERD 07/14/2007    PCP: Allwardt, Crist Infante, PA-C  REFERRING PROVIDER: Dulce Sellar, NP  REFERRING DIAG: M50.30 (ICD-10-CM) - DDD (degenerative disc disease), cervical  THERAPY DIAG:  Cervicalgia  Cramp and spasm  Muscle weakness (generalized)  Rationale for Evaluation and Treatment: Rehabilitation  ONSET DATE: ED visit on 06/07/2023  SUBJECTIVE:  SUBJECTIVE STATEMENT: Patient states that she is able to golf and she is not having any pain.  Pt denies current pain today.  States that she is wondering if she is having some thyroid trouble, so she is going to follow up with her MD.  Hand dominance: Right  PERTINENT HISTORY:  HTN, DDD  PAIN:  Are you having pain? Yes: NPRS scale: 0/10 Pain location: cervical and into right shoulder Pain description: throbbing Aggravating factors: worse at night when she lies down at night Relieving factors: medication, heat pack  PRECAUTIONS: None  RED FLAGS: None     WEIGHT BEARING RESTRICTIONS: No  FALLS:  Has patient fallen in last 6 months? 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  NEXT MD VISIT: Mar 08, 2023 for annual check-up  OBJECTIVE:   DIAGNOSTIC FINDINGS:  Cervical CT scan on 06/06/2023: IMPRESSION: Generalized cervical spine degeneration with foraminal  impingement on the left at C3-4 and right at C5-6.  PATIENT SURVEYS:  Eval:  FOTO 61 (projected 75 by visit 10) 07/07/2023:  FOTO 96 (goal met)  COGNITION: Overall cognitive status: Within functional limits for tasks assessed  SENSATION: Pt denies numbness and tingling  POSTURE: No Significant postural limitations  PALPATION: Tightness noted bilateral upper traps with spasms noted   CERVICAL ROM:   Active ROM A/PROM (deg) eval  Flexion 50  Extension 50  Right lateral flexion 25  Left lateral flexion 35  Right rotation 44  Left rotation 50   (Blank rows = not tested)  UPPER EXTREMITY ROM:  Eval:  WFL  UPPER EXTREMITY MMT:  Eval:   Left shoulder is WFL Right shoulder is grossly 4+ to 5-/5 throughout  CERVICAL SPECIAL TESTS:  No change in pain with cervical compression and distraction   TODAY'S TREATMENT:                                                                                                                               DATE: 07/07/2023 UBE level 1.5 x3 min each direction with PT present to discuss status 3 way scapular stabilization with yellow loop 2x5 bilat 4D scapular stabilization with 2# light blue ball on wall x20 each bilat Supine serratus punch with 3# 2x10 bilat Supine arm tracing of alphabet with 3# dumbbell A-Z bilat Seated shoulder flexion with 3# 2x10 Seated shoulder ER with red tband 2x10 Seated shoulder horizontal shoulder abduction with red tband 2x10 Standing shoulder D2 with red tband 2x10 bilat FOTO   DATE: 06/16/2023 UBE level 1.0 x3 min each direction with PT present to discuss status 3 way scapular stabilization with blue loop  2x5 bilat 4D scapular stabilization with 2# light blue ball on wall x20 each bilat Supine serratus punch with 2# 2x10 bilat Supine arm tracing of alphabet with 2# dumbbell A-Z bilat Bent forward over PT mat with 2# dumbbell:  shoulder flexion, shoulder horizontal abduction, shoulder extension.  2x10 each  bilat Trigger Point Dry-Needling  Treatment instructions: Expect mild to moderate muscle soreness. S/S of pneumothorax if dry needled over a lung field, and to seek immediate medical attention should they occur. Patient verbalized understanding of these instructions and education. Patient Consent Given: Yes Education handout provided: Yes Muscles treated: right suboccipital, right cervical multifidi, right upper trap, right rhomboid Electrical stimulation performed: No Parameters: N/A Treatment response/outcome: Utilized skilled palpation to identify bony landmarks and trigger points.  Able to illicit twitch response and muscle elongation.  Soft tissue mobilization following to further promote tissue elongation.    DATE: 06/10/2023 Reviewed HEP (see below) Trigger Point Dry-Needling  Treatment instructions: Expect mild to moderate muscle soreness. S/S of pneumothorax if dry needled over a lung field, and to seek immediate medical attention should they occur. Patient verbalized understanding of these instructions and education. Patient Consent Given: Yes Education handout provided: Yes Muscles treated: right suboccipital, right upper trap, right rhomboid Electrical stimulation performed: No Parameters: N/A Treatment response/outcome: Utilized skilled palpation to identify bony landmarks and trigger points.  Able to illicit twitch response and muscle elongation.  Soft tissue mobilization following to further promote tissue elongation.    PATIENT EDUCATION:  Education details: Issued HEP Person educated: Patient Education method: Explanation, Facilities manager, and Handouts Education comprehension: verbalized understanding  HOME EXERCISE PROGRAM: Access Code: [email or web address] URL: [email or web address] Date: 06/10/2023 Prepared by: Reather Laurence  Exercises - Seated Scapular Retraction  - 1-2 x daily - 7 x weekly - 2 sets - 10 reps - Seated Cervical Rotation AROM  - 1-2 x daily - 7  x weekly - 2 sets - 10 reps - Seated Cervical Sidebending AROM  - 1-2 x daily - 7 x weekly - 2 sets - 10 reps  ASSESSMENT:  CLINICAL IMPRESSION: Ms Dant presents to skilled PT reporting that she is continuing to feel significantly better and can tell that she is getting stronger.  Pt states that she is going to her MD tomorrow to follow up on her thyroid to assess if some of the nodules that she at times palpates are due to her thyroid.  Patient has met her FOTO goal at this time, but wants to continue PT another few weeks at once a week to ensure that she maintains the gains that she has made with PT and continue with her strengthening.  OBJECTIVE IMPAIRMENTS: decreased ROM, decreased strength, increased muscle spasms, and pain.   ACTIVITY LIMITATIONS: lifting, sleeping, and reach over head  PARTICIPATION LIMITATIONS: cleaning, community activity, and yard work  PERSONAL FACTORS: Past/current experiences and 1 comorbidity: cervical DDD, HTN  are also affecting patient's functional outcome.   REHAB POTENTIAL: Good  CLINICAL DECISION MAKING: Stable/uncomplicated  EVALUATION COMPLEXITY: Low   GOALS: Goals reviewed with patient? Yes  SHORT TERM GOALS: Target date: 07/03/2023  Pt will be independent with initial HEP. Baseline:  Goal status: MET on 06/16/23  2.  Pt will report at least a 25% improvement in symptoms/pain since starting PT. Baseline:  Goal status: MET on 06/16/2023   LONG TERM GOALS: Target date: 07/31/2023  Pt will be independent with advanced HEP to allow for self-progression following discharge. Baseline:  Goal status: Ongoing  2.  Pt  will increase FOTO to 75 to demonstrate improvements in functional mobility/tasks. Baseline: 61 Goal status: MET  3.  Pt will return to being able to play golf without increased cervical pain. Baseline:  Goal status: Ongoing  4.  Patient to increase cervical A/ROM to [facility] without pain to allow for driving and playing  golf. Baseline:  Goal status: MET on 06/16/2023  5.  Patient to improve right shoulder strength to [facility] to allow her to lift objects and carry in grocery bags and golf bag without difficulty. Baseline:  Goal status: Ongoing    PLAN:  PT FREQUENCY: 1-2x/week  PT DURATION: 8 weeks  PLANNED INTERVENTIONS: Therapeutic exercises, Therapeutic activity, Neuromuscular re-education, Balance training, Gait training, Patient/Family education, Self Care, Joint mobilization, Joint manipulation, Vestibular training, Canalith repositioning, Aquatic Therapy, Dry Needling, Electrical stimulation, Spinal manipulation, Spinal mobilization, Cryotherapy, Moist heat, Taping, Traction, Ultrasound, Ionotophoresis 4mg /ml Dexamethasone, Manual therapy, and Re-evaluation  PLAN FOR NEXT SESSION: Update HEP, strengthening, ask about MD appointment   Reather Laurence, PT, DPT 07/07/23, 11:47 AM  [facility] [address], [address] [place], [place] [address] Phone # [phone] Fax [phone]     PHYSICAL THERAPY DISCHARGE SUMMARY  As of 11/23/23, patient has not returned for further visits and discharged from PT at this time.  Please see above for most recent PT update.   Patient agrees to discharge. Patient goals were partially met. Patient is being discharged due to not returning since the last visit.  Clydie Braun Jennavecia Schwier, PT, DPT 11/23/23, 10:25 AM

## 2023-07-08 ENCOUNTER — Ambulatory Visit (INDEPENDENT_AMBULATORY_CARE_PROVIDER_SITE_OTHER): Payer: Medicare HMO | Admitting: Physician Assistant

## 2023-07-08 ENCOUNTER — Encounter: Payer: Self-pay | Admitting: Physician Assistant

## 2023-07-08 VITALS — BP 162/64 | HR 89 | Temp 97.7°F | Ht 65.0 in | Wt 164.4 lb

## 2023-07-08 DIAGNOSIS — M5030 Other cervical disc degeneration, unspecified cervical region: Secondary | ICD-10-CM | POA: Diagnosis not present

## 2023-07-08 DIAGNOSIS — R591 Generalized enlarged lymph nodes: Secondary | ICD-10-CM

## 2023-07-08 DIAGNOSIS — Z23 Encounter for immunization: Secondary | ICD-10-CM | POA: Diagnosis not present

## 2023-07-08 DIAGNOSIS — I10 Essential (primary) hypertension: Secondary | ICD-10-CM | POA: Diagnosis not present

## 2023-07-08 NOTE — Progress Notes (Signed)
Subjective:    Patient ID: [name], female    DOB: January 31, 1951, 72 y.o.   MRN: [id number]  Chief Complaint  Patient presents with   Hospitalization Follow-up   Hypertension    HPI Patient is in today for recurrent swollen lymph nodes around her neck.  More tired than normal lately. Admits she just got back from a major golf tournament this week.  No neck pain at all. Working with PT on degenerative findings.  No pain at all. Today feels good, slept well last night, not really tired.   Headache last night, blood pressure was 174/97 at 9 PM last night.  Taking Lisinopril 40 mg daily without blood pressure coming down.   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   Hypertension    PONV (postoperative nausea and vomiting)    Seasonal allergies    Spider veins 11/23/2013    Past Surgical History:  Procedure Laterality Date   BREAST BIOPSY     left   COLONOSCOPY  2005   Dr Victorino Dike   DENTAL SURGERY     Cyst on right bottom tooth   SUPRACLAVICAL NODE BIOPSY Left 09/01/2022   Procedure: EXCISION LIPOMA LEFT SUPRACLAVICAL FOSSA;  Surgeon: Loreli Slot, MD;  Location: [facility] OR;  Service: Thoracic;  Laterality: Left;   TOE SURGERY     TONSILLECTOMY AND ADENOIDECTOMY     UPPER GASTROINTESTINAL ENDOSCOPY      Family History  Problem Relation Age of Onset   Heart disease Mother    Diabetes Mother    Cancer Mother        hodgekins lymphoma   Diabetes Sister    Diabetes Maternal Grandmother    Colon cancer  Neg Hx    Stomach cancer Neg Hx     Social History   Tobacco Use   Smoking status: Never   Smokeless tobacco: Never  Vaping Use   Vaping status: Never Used  Substance Use Topics   Alcohol use: Yes    Alcohol/week: 8.0 standard drinks of alcohol    Types: 8 Glasses of wine per week    Comment: Social   Drug use: No     Allergies  Allergen Reactions   Statins Other (See Comments)    myalgias   Sulfonamide Derivatives Hives   Zetia [Ezetimibe] Diarrhea   Cefdinir Rash    Review of Systems NEGATIVE UNLESS OTHERWISE INDICATED IN HPI      Objective:     BP (!) 162/64   Pulse 89   Temp 97.7 F (36.5 C)   Ht 5\' 5"  (1.651 m)   Wt 164 lb 6.4 oz (74.6 kg)   LMP 10/20/2000   SpO2 96%   BMI 27.36 kg/m   Wt Readings from Last 3 Encounters:  07/08/23 164 lb 6.4 oz (74.6 kg)  06/10/23 165 lb 4 oz (75 kg)  06/06/23 160 lb (72.6 kg)    BP Readings from  Last 3 Encounters:  07/08/23 (!) 162/64  06/10/23 (!) 173/92  06/06/23 (!) 182/67     Physical Exam Vitals and nursing note reviewed.  Constitutional:      Appearance: Normal appearance.  Cardiovascular:     Rate and Rhythm: Normal rate and regular rhythm.     Pulses: Normal pulses.     Heart sounds: No murmur heard. Pulmonary:     Effort: Pulmonary effort is normal.     Breath sounds: Normal breath sounds.  Lymphadenopathy:     Head:     Right side of head: No submental, submandibular, tonsillar, preauricular, posterior auricular or occipital adenopathy.     Left side of head: No submental, submandibular, tonsillar, preauricular, posterior auricular or occipital adenopathy.     Cervical: No cervical adenopathy.     Upper Body:     Right upper body: No supraclavicular or axillary adenopathy.     Left upper body: No supraclavicular or axillary adenopathy.  Neurological:     General: No focal deficit present.     Mental Status: She is alert.  Psychiatric:        Mood and Affect: Mood normal.         Assessment & Plan:  DDD (degenerative disc disease), cervical Assessment & Plan: Chronic, reviewed recent CT and [place]. Ibuprofen 600 mg prn.  Recommend f/up with sports medicine   Orders: -     Ambulatory referral to Sports Medicine  Lymphadenopathy -     Ambulatory referral to Sports Medicine  Essential hypertension Assessment & Plan: Elevated in office, asked her to monitor at home. Continue lisinopril 20 mg. May need to add hydrochlorothiazide.  Great work with lifestyle. Call if averaging 140s/90s or higher.    Need for influenza vaccination -     Flu Vaccine Trivalent High Dose (Fluad)   Reassured - no lymphadenopathy noted on exam today. No red flag symptoms present. Consider work-up with sports med, or just watchful waiting at this time. Pt can call to be seen same-day if any recurrence.      Return if symptoms worsen or fail to improve.    Yanis Juma M Everrett Lacasse, PA-C

## 2023-07-09 ENCOUNTER — Encounter: Payer: Medicare HMO | Admitting: Rehabilitative and Restorative Service Providers"

## 2023-07-10 ENCOUNTER — Encounter: Payer: Self-pay | Admitting: Physician Assistant

## 2023-07-12 DIAGNOSIS — M5030 Other cervical disc degeneration, unspecified cervical region: Secondary | ICD-10-CM | POA: Insufficient documentation

## 2023-07-12 NOTE — Assessment & Plan Note (Signed)
Elevated in office, asked her to monitor at home. Continue lisinopril 20 mg. May need to add hydrochlorothiazide.  Great work with lifestyle. Call if averaging 140s/90s or higher.

## 2023-07-12 NOTE — Assessment & Plan Note (Signed)
Chronic, reviewed recent CT and [place]. Ibuprofen 600 mg prn.  Recommend f/up with sports medicine

## 2023-07-21 ENCOUNTER — Encounter: Payer: Medicare HMO | Admitting: Rehabilitative and Restorative Service Providers"

## 2023-07-23 ENCOUNTER — Encounter: Payer: Medicare HMO | Admitting: Rehabilitative and Restorative Service Providers"

## 2023-07-28 ENCOUNTER — Encounter: Payer: Medicare HMO | Admitting: Rehabilitative and Restorative Service Providers"

## 2023-07-30 ENCOUNTER — Ambulatory Visit (INDEPENDENT_AMBULATORY_CARE_PROVIDER_SITE_OTHER): Payer: Medicare HMO | Admitting: Physician Assistant

## 2023-07-30 ENCOUNTER — Ambulatory Visit: Payer: Medicare HMO | Admitting: Physician Assistant

## 2023-07-30 ENCOUNTER — Other Ambulatory Visit: Payer: Self-pay | Admitting: Thoracic Surgery (Cardiothoracic Vascular Surgery)

## 2023-07-30 ENCOUNTER — Encounter: Payer: Medicare HMO | Admitting: Rehabilitative and Restorative Service Providers"

## 2023-07-30 ENCOUNTER — Encounter: Payer: Self-pay | Admitting: Physician Assistant

## 2023-07-30 VITALS — BP 154/74 | HR 52 | Temp 98.2°F | Ht 65.0 in | Wt 161.8 lb

## 2023-07-30 DIAGNOSIS — I10 Essential (primary) hypertension: Secondary | ICD-10-CM

## 2023-07-30 DIAGNOSIS — D171 Benign lipomatous neoplasm of skin and subcutaneous tissue of trunk: Secondary | ICD-10-CM

## 2023-07-30 MED ORDER — OLMESARTAN MEDOXOMIL-HCTZ 20-12.5 MG PO TABS
1.0000 | ORAL_TABLET | Freq: Every day | ORAL | 1 refills | Status: DC
Start: 2023-07-30 — End: 2023-08-24

## 2023-07-30 NOTE — Assessment & Plan Note (Signed)
Elevated readings; Will switch from lisinopril to Benicar-hydrochlorothiazide 20-12.5 mg. Monitor at home.  Goal is <140/90.  Pt does well with active lifestyle.

## 2023-07-30 NOTE — Progress Notes (Signed)
Subjective:    Patient ID: [name], female    DOB: [birth year], 72 y.o.   MRN: [id number]  Chief Complaint  Patient presents with   Hypertension    Pt in office for BP check and f/u with PCP; pt has been trying to take at home and brought record with her; no other concerns, pt states can't take statin any longer but added supplement Citrus Bergamot and CO-Q-10 to help with cholesterol.     HPI Patient is in today for BP recheck. She is taking Lisinopril 20 mg BID, not always remembering second dose.   Recent home readings: 141/79 151/78 156/89   No symptoms.   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   Hypertension    PONV (postoperative nausea and vomiting)    Seasonal allergies    Spider veins 11/23/2013    Past Surgical History:  Procedure Laterality Date   BREAST BIOPSY     left   COLONOSCOPY  2005   Dr Victorino Dike   DENTAL SURGERY     Cyst on right bottom tooth   SUPRACLAVICAL NODE BIOPSY Left 09/01/2022   Procedure: EXCISION LIPOMA LEFT SUPRACLAVICAL FOSSA;  Surgeon: Loreli Slot, MD;  Location: [facility] OR;  Service: Thoracic;  Laterality: Left;   TOE SURGERY     TONSILLECTOMY AND ADENOIDECTOMY     UPPER GASTROINTESTINAL ENDOSCOPY      Family History  Problem Relation Age of Onset   Heart disease Mother    Diabetes Mother    Cancer Mother        hodgekins lymphoma   Diabetes Sister    Diabetes Maternal Grandmother    Colon cancer Neg Hx    Stomach cancer Neg Hx     Social History    Tobacco Use   Smoking status: Never   Smokeless tobacco: Never  Vaping Use   Vaping status: Never Used  Substance Use Topics   Alcohol use: Yes    Alcohol/week: 8.0 standard drinks of alcohol    Types: 8 Glasses of wine per week    Comment: Social   Drug use: No     Allergies  Allergen Reactions   Statins Other (See Comments)    myalgias   Sulfonamide Derivatives Hives   Zetia [Ezetimibe] Diarrhea   Cefdinir Rash    Review of Systems NEGATIVE UNLESS OTHERWISE INDICATED IN HPI      Objective:     BP (!) 154/74 (BP Location: Left Arm)   Pulse (!) 52   Temp 98.2 F (36.8 C) (Temporal)   Ht 5\' 5"  (1.651 m)   Wt 161 lb 12.8 oz (73.4 kg)   LMP 10/20/2000   SpO2 94%   BMI 26.92 kg/m   Wt Readings from Last 3 Encounters:  07/30/23 161 lb 12.8 oz (73.4 kg)  07/08/23 164 lb 6.4 oz (74.6 kg)  06/10/23 165 lb 4 oz (75 kg)    BP Readings  from Last 3 Encounters:  07/30/23 (!) 154/74  07/08/23 (!) 162/64  06/10/23 (!) 173/92     Physical Exam Vitals and nursing note reviewed.  Constitutional:      Appearance: Normal appearance.  Cardiovascular:     Rate and Rhythm: Normal rate and regular rhythm.     Pulses: Normal pulses.     Heart sounds: No murmur heard. Pulmonary:     Effort: Pulmonary effort is normal.     Breath sounds: Normal breath sounds.  Neurological:     General: No focal deficit present.     Mental Status: She is alert.  Psychiatric:        Mood and Affect: Mood normal.        Assessment & Plan:  Essential hypertension Assessment & Plan: Elevated readings; Will switch from lisinopril to Benicar-hydrochlorothiazide 20-12.5 mg. Monitor at home.  Goal is <140/90.  Pt does well with active lifestyle.   Orders: -     Olmesartan Medoxomil-HCTZ; Take 1 tablet by mouth daily.  Dispense: 30 tablet; Refill: 1       Return in about 4 weeks (around 08/27/2023) for blood pressure check.    [name] M Kinley Ferrentino, PA-C

## 2023-07-31 ENCOUNTER — Encounter: Payer: Self-pay | Admitting: Physician Assistant

## 2023-07-31 NOTE — Telephone Encounter (Signed)
Please see patient message regarding new prescription and advise

## 2023-08-02 ENCOUNTER — Other Ambulatory Visit: Payer: Self-pay | Admitting: Physician Assistant

## 2023-08-03 NOTE — Telephone Encounter (Signed)
Please see pt response as Lorain Childes

## 2023-08-11 ENCOUNTER — Encounter: Payer: Self-pay | Admitting: Thoracic Surgery (Cardiothoracic Vascular Surgery)

## 2023-08-17 ENCOUNTER — Encounter: Payer: Self-pay | Admitting: Physician Assistant

## 2023-08-18 NOTE — Telephone Encounter (Signed)
Please see pt msg and advise 

## 2023-08-24 ENCOUNTER — Other Ambulatory Visit: Payer: Self-pay | Admitting: Physician Assistant

## 2023-08-24 DIAGNOSIS — I10 Essential (primary) hypertension: Secondary | ICD-10-CM

## 2023-08-27 ENCOUNTER — Encounter: Payer: Self-pay | Admitting: Physician Assistant

## 2023-08-27 ENCOUNTER — Ambulatory Visit (INDEPENDENT_AMBULATORY_CARE_PROVIDER_SITE_OTHER): Payer: Medicare HMO | Admitting: Physician Assistant

## 2023-08-27 VITALS — BP 132/78 | HR 76 | Temp 97.5°F | Ht 65.0 in | Wt 160.4 lb

## 2023-08-27 DIAGNOSIS — I10 Essential (primary) hypertension: Secondary | ICD-10-CM

## 2023-08-27 DIAGNOSIS — I499 Cardiac arrhythmia, unspecified: Secondary | ICD-10-CM

## 2023-08-27 DIAGNOSIS — E782 Mixed hyperlipidemia: Secondary | ICD-10-CM

## 2023-08-27 DIAGNOSIS — I77819 Aortic ectasia, unspecified site: Secondary | ICD-10-CM | POA: Diagnosis not present

## 2023-08-27 DIAGNOSIS — Z789 Other specified health status: Secondary | ICD-10-CM

## 2023-08-27 NOTE — Progress Notes (Signed)
Subjective:    Patient ID: [name], female    DOB: 30-Apr-1951, 72 y.o.   MRN: [id number]  Chief Complaint  Patient presents with   Anxiety   Asthma   Hypertension    Pt here for f/u om bp readings, pt feels slightly tired, has at home bp readings today   Gastroesophageal Reflux    HPI Discussed the use of AI scribe software for clinical note transcription with the patient, who gave verbal consent to proceed.  History of Present Illness   The patient, with a history of high blood pressure, presents with a chief complaint of feeling sluggish and tired. She attributes these symptoms to her blood pressure medication, but expresses a preference for feeling sluggish over risking a heart attack. Despite these symptoms, the patient reports being able to engage in activities such as golf and housework, although she notes that she has to push herself to do so. The patient also mentions a history of hyperactivity and suggests that the blood pressure medication may be bringing her to a more normal state of energy. She reports sleeping well.  The patient also discusses concerns about her heart. She reports that lying on her side causes her heart to go out of rhythm, and she sometimes experiences a fast heartbeat at night. She has an upcoming appointment with a cardiologist for further evaluation, including a scan involving dye injection. The patient mentions that a previous scan showed some plaque in the aorta and a small bulge in the descending aorta.    Past Medical History:  Diagnosis Date   Acute meniscal tear of left knee    treated by Dr Theador Hawthorne date pending   Arthritis    Asthma    as a child   Baker's cyst of knee    left knee-treated by Dr Madelon Lips   Complication of anesthesia    Patient states she woke during an endoscopy but no problems since then   GAD (generalized anxiety disorder) 03/12/2015   -with rare panic disorder related to caring for grandaughter    GERD  (gastroesophageal reflux disease)    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   Hypertension    PONV (postoperative nausea and vomiting)    Seasonal allergies    Spider veins 11/23/2013    Past Surgical History:  Procedure Laterality Date   BREAST BIOPSY     left   COLONOSCOPY  2005   Dr Victorino Dike   DENTAL SURGERY     Cyst on right bottom tooth   SUPRACLAVICAL NODE BIOPSY Left 09/01/2022   Procedure: EXCISION LIPOMA LEFT SUPRACLAVICAL FOSSA;  Surgeon: Loreli Slot, MD;  Location: [facility] OR;  Service: Thoracic;  Laterality: Left;   TOE SURGERY     TONSILLECTOMY AND ADENOIDECTOMY     UPPER GASTROINTESTINAL ENDOSCOPY      Family History  Problem Relation Age of Onset   Heart disease Mother    Diabetes Mother    Cancer Mother        hodgekins lymphoma   Diabetes Sister    Diabetes Maternal Grandmother    Colon cancer Neg Hx    Stomach cancer Neg Hx     Social History   Tobacco Use   Smoking status: Never   Smokeless tobacco: Never  Vaping Use   Vaping status: Never Used  Substance Use Topics   Alcohol use: Yes    Alcohol/week: 8.0 standard drinks of alcohol    Types: 8 Glasses of wine per week  Comment: Social   Drug use: No     Allergies  Allergen Reactions   Statins Other (See Comments)    myalgias   Sulfonamide Derivatives Hives   Zetia [Ezetimibe] Diarrhea   Cefdinir Rash    Review of Systems NEGATIVE UNLESS OTHERWISE INDICATED IN HPI      Objective:     BP 132/78   Pulse 76   Temp (!) 97.5 F (36.4 C)   Ht 5\' 5"  (1.651 m)   Wt 160 lb 6.4 oz (72.8 kg)   LMP 10/20/2000   SpO2 98%   BMI 26.69 kg/m   Wt Readings from Last 3 Encounters:  08/27/23 160 lb 6.4 oz (72.8 kg)  07/30/23 161 lb 12.8 oz (73.4 kg)  07/08/23 164 lb 6.4 oz (74.6 kg)    BP Readings from Last 3 Encounters:  08/27/23 132/78  07/30/23 (!) 154/74  07/08/23 (!) 162/64     Physical Exam Vitals and nursing note reviewed.  Constitutional:      Appearance: Normal  appearance.  Cardiovascular:     Rate and Rhythm: Normal rate and regular rhythm.     Pulses: Normal pulses.     Heart sounds: No murmur heard. Pulmonary:     Effort: Pulmonary effort is normal.     Breath sounds: Normal breath sounds.  Musculoskeletal:     Right lower leg: No edema.     Left lower leg: No edema.  Neurological:     General: No focal deficit present.     Mental Status: She is alert.  Psychiatric:        Mood and Affect: Mood normal.        Assessment & Plan:  Essential hypertension  Cardiac arrhythmia, unspecified cardiac arrhythmia type  Aortic dilatation (HCC)  Mixed hyperlipidemia  Statin intolerance    Assessment and Plan    Hypertension Improved control with current medication, though patient reports feeling sluggish. No signs of heart failure or other complications. -Continue current medication regimen. (Benicar HCT 20-12.5 mg every day) -Consider thyroid function tests given the duration since last assessment and current symptoms.  Atrial Arrhythmia Patient reports arrhythmia when lying on side, but no other symptoms. -Continue following with cardiology. Normal rate and rhythm today.   Aortic Plaque and Dilatation Identified on previous CT scan, with follow-up imaging scheduled for 09/09/2023. -Await results of follow-up imaging.  Hyperlipidemia Previous intolerance to statin and Zetia. -Discuss cholesterol levels with cardiologist during upcoming visit.  General Health Maintenance -Continue current lifestyle modifications. -Follow-up after imaging and cardiology appointment.          Return if symptoms worsen or fail to improve.  Time Spent: 38 minutes of total time was spent on the date of the encounter performing the following actions: chart review prior to seeing the patient, obtaining history, performing a medically necessary exam, counseling on the treatment plan, placing orders, and documenting in our EHR.    Naoko Diperna M  Kaho Selle, PA-C

## 2023-08-28 DIAGNOSIS — I77819 Aortic ectasia, unspecified site: Secondary | ICD-10-CM | POA: Insufficient documentation

## 2023-08-28 DIAGNOSIS — Z789 Other specified health status: Secondary | ICD-10-CM | POA: Insufficient documentation

## 2023-09-09 ENCOUNTER — Ambulatory Visit
Admission: RE | Admit: 2023-09-09 | Discharge: 2023-09-09 | Disposition: A | Discharge status: Home / Self Care | Payer: Medicare HMO | Source: Ambulatory Visit | Attending: Thoracic Surgery (Cardiothoracic Vascular Surgery) | Admitting: Thoracic Surgery (Cardiothoracic Vascular Surgery)

## 2023-09-09 DIAGNOSIS — E041 Nontoxic single thyroid nodule: Secondary | ICD-10-CM | POA: Diagnosis not present

## 2023-09-09 DIAGNOSIS — D171 Benign lipomatous neoplasm of skin and subcutaneous tissue of trunk: Secondary | ICD-10-CM

## 2023-09-09 DIAGNOSIS — I700 Atherosclerosis of aorta: Secondary | ICD-10-CM | POA: Diagnosis not present

## 2023-09-09 DIAGNOSIS — I7123 Aneurysm of the descending thoracic aorta, without rupture: Secondary | ICD-10-CM | POA: Diagnosis not present

## 2023-09-09 MED ORDER — IOPAMIDOL (ISOVUE-370) INJECTION 76%
75.0000 mL | Freq: Once | INTRAVENOUS | Status: AC | PRN
  Administered 2023-09-09: 75 mL via INTRAVENOUS

## 2023-09-15 ENCOUNTER — Ambulatory Visit: Payer: Medicare HMO | Admitting: Thoracic Surgery (Cardiothoracic Vascular Surgery)

## 2023-09-15 ENCOUNTER — Other Ambulatory Visit: Payer: Self-pay | Admitting: Thoracic Surgery (Cardiothoracic Vascular Surgery)

## 2023-09-15 ENCOUNTER — Encounter: Payer: Self-pay | Admitting: Thoracic Surgery (Cardiothoracic Vascular Surgery)

## 2023-09-15 VITALS — BP 151/75 | HR 66 | Resp 18 | Ht 65.0 in | Wt 161.0 lb

## 2023-09-15 DIAGNOSIS — D171 Benign lipomatous neoplasm of skin and subcutaneous tissue of trunk: Secondary | ICD-10-CM | POA: Diagnosis not present

## 2023-09-15 NOTE — Progress Notes (Signed)
[address]       [name] [id number]             [phone]    HPI: [name] returns for scheduled follow-up of a mildly dilated ascending thoracic aorta.  [name] is a 72 year old [name] with history of asthma, reflux, anxiety, and a supraclavicular lipoma.  During her preoperative evaluation she was noted to have a small bulge in the proximal descending aorta.  I resected the lipoma inNovember 2023.  She did well but does have some residual numbness around the left shoulder area.  She continues to have that numbness but says it does not really bother her.  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   Hypertension    PONV (postoperative nausea and vomiting)    Seasonal allergies    Spider veins 11/23/2013    Current Outpatient Medications  Medication Sig Dispense Refill   fluticasone (FLONASE) 50 MCG/ACT nasal spray Place 1 spray into both nostrils daily.     ibuprofen (ADVIL) 600 MG tablet Take 1 tablet (600 mg total) by mouth every 6 (six) hours as needed. 30 tablet 0   Multiple Vitamins-Minerals (HAIR/SKIN/NAILS/BIOTIN PO) Take 1 capsule by mouth daily.     Multiple Vitamins-Minerals (MULTIVITAMIN PO) Take 1 tablet by mouth daily.     olmesartan-hydrochlorothiazide (BENICAR HCT) 20-12.5 MG tablet TAKE 1 TABLET BY MOUTH EVERY DAY 90 tablet 1   Probiotic Product (PROBIOTIC PO) Take 1 capsule by mouth daily.     valACYclovir (VALTREX) 500 MG tablet TAKE 1 TABLET BY MOUTH TWICE A DAY AS NEEDED 30  tablet 12   No current facility-administered medications for this visit.    Physical Exam BP (!) 151/75 (BP Location: Left Arm, Patient Position: Sitting)   Pulse 66   Resp 18   Ht 5\' 5"  (1.651 m)   Wt 161 lb (73 kg)   LMP 10/20/2000   SpO2 98% Comment: RA  BMI 26.Erin kg/m  [name] in no acute distress Alert and oriented x 3 with no focal deficits Lungs clear bilaterally No cervical/supraclavicular adenopathy.  I am unable to palpate a thyroid nodule.   Diagnostic Tests: CT ANGIOGRAPHY CHEST WITH CONTRAST   TECHNIQUE: Multidetector CT imaging of the chest was performed using the standard protocol during bolus administration of intravenous contrast. Multiplanar CT image reconstructions and MIPs were obtained to evaluate the vascular anatomy.   RADIATION DOSE REDUCTION: This exam was performed according to the departmental dose-optimization program which includes automated exposure control, adjustment of the mA and/or kV according to patient size and/or use of iterative reconstruction technique.   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 The ascending aorta and distal descending thoracic aorta  are of normal caliber. No intramural hematoma or dissection. Mild atherosclerotic calcification. The arch vasculature demonstrates classic anatomic configuration and is widely patent proximally.   Mediastinum/Nodes: 14 mm nodule within the left thyroid lobe appears enlarged since prior examination and is not well characterized on this examination. No pathologic thoracic adenopathy. The esophagus is unremarkable.   Lungs/Pleura: Mild bibasilar parenchymal scarring. No confluent pulmonary infiltrate. No  pneumothorax or pleural effusion. Central airways are widely patent.   Upper Abdomen: Simple cyst again noted within the visualized right hepatic lobe. No acute abnormality within the visualized upper abdomen.   Musculoskeletal: No acute bone abnormality. No lytic or blastic bone lesion. Osseous structures are age-appropriate.   Review of the MIP images confirms the above findings.   IMPRESSION: 1. 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 Recommend annual imaging followup by CTA or MRA. This recommendation follows 2010 ACCF/AHA/AATS/ACR/ASA/SCA/SCAI/SIR/STS/SVM Guidelines for the Diagnosis and Management of Patients with Thoracic Aortic Disease. Circulation.2010; 121: U981-X914. Aortic aneurysm NOS (ICD10-I71.9) 2. 14 mm nodule within the left thyroid lobe appears enlarged since prior examination and is not well characterized on this examination. Given its interval growth, if indicated, this would be better assessed with dedicated thyroid sonography.   Aortic Atherosclerosis (ICD10-I70.0).     Electronically Signed   By: Helyn Numbers M.D.   On: 09/09/2023 14:51   I personally reviewed the CT images.  Stable mild dilatation of the proximal descending aorta.  Left thyroid nodule.  Impression: [name] is a [name] with history of asthma, reflux, anxiety, and a supraclavicular lipoma.  During her preoperative evaluation she was noted to have a small bulge in the proximal descending aorta.  Dilated descending aorta-about 3.5 cm in diameter.  Stable from a year ago.  Will continue to monitor with CTs annually.  Hypertension-blood pressure generally been well-controlled since starting Benicar.  Monitor.  Thyroid nodule-14 mm nodule noted on CT.  Will obtain thyroid ultrasound to further evaluate and see if that needs biopsy or referral to Dr.  Gerrit Friends.  Plan: Thyroid ultrasound Check TSH and T4 levels Return in 1 year with CT angio of chest  I spent over 20 minutes in review of records, images, and in consultation with [name] today. Loreli Slot, MD [facility] [phone]

## 2023-09-16 ENCOUNTER — Ambulatory Visit
Admission: RE | Admit: 2023-09-16 | Discharge: 2023-09-16 | Disposition: A | Discharge status: Home / Self Care | Payer: Medicare HMO | Source: Ambulatory Visit | Attending: Thoracic Surgery (Cardiothoracic Vascular Surgery) | Admitting: Thoracic Surgery (Cardiothoracic Vascular Surgery)

## 2023-09-16 DIAGNOSIS — E042 Nontoxic multinodular goiter: Secondary | ICD-10-CM | POA: Diagnosis not present

## 2023-09-16 DIAGNOSIS — D171 Benign lipomatous neoplasm of skin and subcutaneous tissue of trunk: Secondary | ICD-10-CM

## 2023-09-16 LAB — TSH: TSH: 1.58 m[IU]/L (ref 0.40–4.50)

## 2023-09-17 ENCOUNTER — Encounter: Payer: Self-pay | Admitting: Thoracic Surgery (Cardiothoracic Vascular Surgery)

## 2023-09-21 ENCOUNTER — Telehealth: Payer: Self-pay | Admitting: Physician Assistant

## 2023-09-21 ENCOUNTER — Encounter: Payer: Self-pay | Admitting: Urgent Care

## 2023-09-21 ENCOUNTER — Other Ambulatory Visit: Payer: Self-pay | Admitting: Thoracic Surgery (Cardiothoracic Vascular Surgery)

## 2023-09-21 ENCOUNTER — Ambulatory Visit (INDEPENDENT_AMBULATORY_CARE_PROVIDER_SITE_OTHER): Payer: Medicare HMO | Admitting: Urgent Care

## 2023-09-21 VITALS — BP 160/77 | HR 92 | Temp 98.0°F | Wt 164.0 lb

## 2023-09-21 DIAGNOSIS — T6591XA Toxic effect of unspecified substance, accidental (unintentional), initial encounter: Secondary | ICD-10-CM

## 2023-09-21 DIAGNOSIS — E042 Nontoxic multinodular goiter: Secondary | ICD-10-CM

## 2023-09-21 MED ORDER — LORATADINE 10 MG PO TABS: 10.0000 mg | ORAL_TABLET | Freq: Every day | ORAL | 0 refills | Status: DC

## 2023-09-21 MED ORDER — FAMOTIDINE 20 MG PO TABS: 20.0000 mg | ORAL_TABLET | Freq: Every day | ORAL | 0 refills | Status: DC

## 2023-09-21 MED ORDER — TRIAMCINOLONE ACETONIDE 0.5 % EX OINT: 1.0000 | TOPICAL_OINTMENT | Freq: Two times a day (BID) | CUTANEOUS | 0 refills | Status: DC

## 2023-09-21 NOTE — Progress Notes (Signed)
Established Patient Office Visit  Subjective:  Patient ID: [name], female    DOB: Jan 23, 1951  Age: 72 y.o. MRN: [id number]  Chief Complaint  Patient presents with   Rash    She noticed a knot on her neck last night that started itching and burning.    Pleasant 72yo female presents today with primary concern of itchy rash to the R side of her neck. She states she has been diagnosed with spinal stenosis, and applied topical menthol cream or some type of Beng[place]. She woke up with the area red, irritated and itchy yesterday morning. She takes 25mg  doxylamine sleep aid every night and states this did help to some extent with the itching. She is concerned however as she leaves for [place] tomorrow and will be gone on a cruise ship for 2 weeks. Additionally, pt inquires about her recent thyroid ultrasound. Recently was found to have multiple lymph nodes enlarged in her neck and also was found to have a nodule per a CT scan. She completed a thyroid US on 09/15/23 which showed 4 nodules, 2 of which meet criteria for FNA. She denies any symptoms of hypothyroidism and denies dysphagia. States she otherwise feels well.   Rash    Patient Active Problem List   Diagnosis Date Noted   Statin intolerance 08/28/2023   Aortic dilatation (HCC) 08/28/2023   DDD (degenerative disc disease), cervical 07/12/2023   Lipoma of skin and subcutaneous tissue of trunk 11/14/2021   Essential hypertension 09/28/2019   Hiatal hernia 08/15/2019   Osteoarthritis of knee 08/15/2019   Hyperlipidemia 05/17/2018   Seasonal allergies 09/17/2015   GAD (generalized anxiety disorder) 03/12/2015   VARICOSE VEIN 01/14/2008   GERD 07/14/2007   Past Medical History:  Diagnosis Date   Acute meniscal tear of left knee    treated by Dr Theador Hawthorne date pending   Arthritis    Asthma    as a child   Baker's cyst of knee    left knee-treated by Dr Madelon Lips   Complication of anesthesia    Patient states she woke  during an endoscopy but no problems since then   GAD (generalized anxiety disorder) 03/12/2015   -with rare panic disorder related to caring for grandaughter    GERD (gastroesophageal reflux disease)    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   Hypertension    PONV (postoperative nausea and vomiting)    Seasonal allergies    Spider veins 11/23/2013   Social History   Tobacco Use   Smoking status: Never   Smokeless tobacco: Never  Vaping Use   Vaping status: Never Used  Substance Use Topics   Alcohol use: Yes    Alcohol/week: 8.0 standard drinks of alcohol    Types: 8 Glasses of wine per week    Comment: Social   Drug use: No      ROS: as noted in HPI  Objective:     BP (!) 160/77   Pulse 92   Temp 98 F (36.7 C) (Oral)   Wt 164 lb (74.4 kg)   LMP 10/20/2000   SpO2 98%   BMI 27.29 kg/m  BP Readings from Last 3 Encounters:  09/21/23 (!) 160/77  09/15/23 (!) 151/75  08/27/23 132/78   Wt Readings from Last 3 Encounters:  09/21/23 164 lb (74.4 kg)  09/15/23 161 lb (73 kg)  08/27/23 160 lb 6.4 oz (72.8 kg)      Physical Exam Vitals and nursing note reviewed.  Constitutional:  General: She is not in acute distress.    Appearance: She is not ill-appearing or toxic-appearing.  HENT:     Head: Normocephalic and atraumatic.     Right Ear: External ear normal.     Left Ear: External ear normal.     Nose: Nose normal.     Mouth/Throat:     Mouth: Mucous membranes are moist.     Pharynx: No oropharyngeal exudate or posterior oropharyngeal erythema.  Eyes:     General: No scleral icterus.       Right eye: No discharge.        Left eye: No discharge.     Extraocular Movements: Extraocular movements intact.     Pupils: Pupils are equal, round, and reactive to light.  Neck:   Cardiovascular:     Rate and Rhythm: Normal rate.  Pulmonary:     Effort: Pulmonary effort is normal. No respiratory distress.  Neurological:     Mental Status: She is alert.      No  results found for any visits on 09/21/23.  Last CBC Lab Results  Component Value Date   WBC 7.1 06/06/2023   HGB 13.6 06/06/2023   HCT 40.1 06/06/2023   MCV 96.6 06/06/2023   MCH 32.8 06/06/2023   RDW 12.2 06/06/2023   PLT 219 06/06/2023   Last metabolic panel Lab Results  Component Value Date   GLUCOSE 88 06/06/2023   NA 136 06/06/2023   K 4.4 06/06/2023   CL 102 06/06/2023   CO2 27 06/06/2023   BUN 15 06/06/2023   CREATININE 0.65 06/06/2023   GFRNONAA >60 06/06/2023   CALCIUM 9.5 06/06/2023   PROT 6.3 (L) 08/28/2022   ALBUMIN 3.9 08/28/2022   BILITOT 0.9 08/28/2022   ALKPHOS 70 08/28/2022   AST 33 08/28/2022   ALT 27 08/28/2022   ANIONGAP 7 06/06/2023      The 10-year ASCVD risk score (Arnett DK, et al., 2019) is: 25.8%  Assessment & Plan:  Allergic reaction to chemical substance, accidental or unintentional, initial encounter -     Loratadine; Take 1 tablet (10 mg total) by mouth daily for 6 days.  Dispense: 6 tablet; Refill: 0 -     Triamcinolone Acetonide; Apply 1 Application topically 2 (two) times daily.  Dispense: 30 g; Refill: 0 -     Famotidine; Take 1 tablet (20 mg total) by mouth daily.  Dispense: 6 tablet; Refill: 0  Multiple thyroid nodules -     Ambulatory referral to General Surgery  Pt with what appears to be an allergic reaction to the topical menthol cream applied to her neck two days ago. Did have some relief from her doxylamine. Will have pt take several days of claritin and pepcid to help with the itching in combination with triamcinolone 0.5%. Side effect profile of medications discussed with pt.   Pt's BP elevated today she states due to slight anxiety regarding all her recent testing. We did discuss the results today. Referral placed to gen surg for FNA, pt will have this completed upon return from her trip.  No follow-ups on file.   Maretta Bees, PA

## 2023-09-21 NOTE — Telephone Encounter (Signed)
Request transfer to another [facility] PCP  Pt is requesting to transfer FROM:  [name] Pt is requesting to transfer TO:  [name]  Reason for requested transfer: Location very convenient is what Erin patient stated.  Best contact number: [phone]

## 2023-09-21 NOTE — Patient Instructions (Addendum)
Your rash appears to be an allergic reaction to the topical menthol you used for your neck. Please STOP using this topical product. Please avoid any lidocaine patches to your skin.  Apply the topical triamcinolone (steroid) cream to the affected part of your neck twice daily until gone, but do not exceed 14 days of use. You may take one 10mg  tab of claritin and one 20mg  tab of pepcid every morning as well to help with the redness and itching. Once the rash is gone, stop the medications. Take with caution as they can make your mouth dry and cause dehydration, so make sure you drink plenty of water on your cruise.  I have placed a referral to general surgery for you to obtain your fine needle aspiration of your thyroid nodules. Please call and schedule as soon as possible.

## 2023-09-21 NOTE — Telephone Encounter (Signed)
That is fine with me if okay with care team.

## 2023-09-22 ENCOUNTER — Other Ambulatory Visit: Payer: Self-pay | Admitting: Thoracic Surgery (Cardiothoracic Vascular Surgery)

## 2023-09-22 ENCOUNTER — Other Ambulatory Visit (HOSPITAL_COMMUNITY): Payer: Self-pay | Admitting: Thoracic Surgery (Cardiothoracic Vascular Surgery)

## 2023-09-22 DIAGNOSIS — E041 Nontoxic single thyroid nodule: Secondary | ICD-10-CM

## 2023-09-22 NOTE — Telephone Encounter (Signed)
Patient is flying to [place] today and will be out of the country until 10/06/23 FYI.

## 2023-10-07 DIAGNOSIS — E042 Nontoxic multinodular goiter: Secondary | ICD-10-CM | POA: Diagnosis not present

## 2023-10-29 ENCOUNTER — Ambulatory Visit (INDEPENDENT_AMBULATORY_CARE_PROVIDER_SITE_OTHER): Payer: Medicare HMO | Admitting: Urgent Care

## 2023-10-29 ENCOUNTER — Encounter: Payer: Self-pay | Admitting: Urgent Care

## 2023-10-29 VITALS — BP 135/76 | HR 64 | Ht 65.0 in | Wt 164.8 lb

## 2023-10-29 DIAGNOSIS — M25551 Pain in right hip: Secondary | ICD-10-CM | POA: Diagnosis not present

## 2023-10-29 DIAGNOSIS — Z8619 Personal history of other infectious and parasitic diseases: Secondary | ICD-10-CM | POA: Diagnosis not present

## 2023-10-29 DIAGNOSIS — Z79899 Other long term (current) drug therapy: Secondary | ICD-10-CM

## 2023-10-29 DIAGNOSIS — R5383 Other fatigue: Secondary | ICD-10-CM | POA: Diagnosis not present

## 2023-10-29 DIAGNOSIS — I77819 Aortic ectasia, unspecified site: Secondary | ICD-10-CM | POA: Diagnosis not present

## 2023-10-29 DIAGNOSIS — I10 Essential (primary) hypertension: Secondary | ICD-10-CM

## 2023-10-29 DIAGNOSIS — E042 Nontoxic multinodular goiter: Secondary | ICD-10-CM | POA: Diagnosis not present

## 2023-10-29 LAB — T3, FREE: T3, Free: 3.1 pg/mL (ref 2.3–4.2)

## 2023-10-29 LAB — COMPREHENSIVE METABOLIC PANEL
ALT: 23 U/L (ref 0–35)
AST: 23 U/L (ref 0–37)
Albumin: 4.5 g/dL (ref 3.5–5.2)
Alkaline Phosphatase: 83 U/L (ref 39–117)
BUN: 21 mg/dL (ref 6–23)
CO2: 29 meq/L (ref 19–32)
Calcium: 10.1 mg/dL (ref 8.4–10.5)
Chloride: 100 meq/L (ref 96–112)
Creatinine, Ser: 0.75 mg/dL (ref 0.40–1.20)
GFR: 78.77 mL/min (ref >60.00)
Glucose, Bld: 93 mg/dL (ref 70–99)
Potassium: 4.7 meq/L (ref 3.5–5.1)
Sodium: 136 meq/L (ref 135–145)
Total Bilirubin: 0.4 mg/dL (ref 0.2–1.2)
Total Protein: 6.8 g/dL (ref 6.0–8.3)

## 2023-10-29 LAB — T4, FREE: Free T4: 0.84 ng/dL (ref 0.60–1.60)

## 2023-10-29 LAB — TSH: TSH: 1.9 u[IU]/mL (ref 0.35–5.50)

## 2023-10-29 MED ORDER — OLMESARTAN MEDOXOMIL-HCTZ 20-12.5 MG PO TABS: 1.0000 | ORAL_TABLET | Freq: Every day | ORAL | 1 refills | Status: DC

## 2023-10-29 MED ORDER — MELOXICAM 7.5 MG PO TABS: 7.5000 mg | ORAL_TABLET | Freq: Every day | ORAL | 0 refills | Status: DC

## 2023-10-29 MED ORDER — VALACYCLOVIR HCL 1 G PO TABS: ORAL_TABLET | ORAL | 2 refills | Status: AC

## 2023-10-29 NOTE — Progress Notes (Signed)
 Established Patient Office Visit  Subjective:  Patient ID: [name], female    DOB: 23-Apr-1951  Age: 73 y.o. MRN: [id number]  Chief Complaint  Patient presents with   Establish Care    TOC from [facility].   Discussed the use of AI software for clinical note transcription with the patient who gave verbal consent to proceed.   History of Present Illness The patient, a 73 year old with a history of hypertension and degenerative disc disease, presents with concerns about recent weight gain, fatigue, and right hip pain. The patient reports gaining approximately 20 pounds over the past two years, which is a significant change from her usual weight. She also describes a recent increase in fatigue, noting that she has been sleeping for longer periods and still feeling tired upon waking. The patient also reports a decrease in her usual activity level, including difficulty completing a mile walk on the treadmill, which previously was an easy task for her.  The patient's primary concern is a burning, stinging pain in the right hip, which she describes as being located on the hip bone itself. The pain began after lifting her 65# granddaughter, and has been persistent since. The patient has tried using ice for relief, which has been somewhat effective. She also reports that the pain sometimes radiates down the lateral leg to the R knee.  In addition to these symptoms, the patient is also awaiting a biopsy for a thyroid  issue, which has been causing her some stress. She reports that the thyroid  issue was discovered during a workup following the removal of a lipoma a year ago. The patient also mentions a history of aortic dilation, which has been stable since it was first discovered.  The patient's hypertension is managed with olmesartan  hydrochlorothiazide, and she reports that her blood pressure has been well-controlled with this medication. She also occasionally uses a humidifier for respiratory  comfort, and takes a probiotic and multivitamin for general health. The patient denies any current neck pain or headaches, despite a previous diagnosis of degenerative disc disease. She also reports a history of a knee issue, which was successfully treated with rooster comb injections.  Lastly, pt is requesting a refill of her PRN valtrex . 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 Denies a current outbreak, but does not have any on hand and states that OTC abreva does not work.   Assessment and Plan Right Hip Pain Burning and stinging sensation localized to the right hip. Likely musculoskeletal in nature, possibly related to strain or partial tear of muscular tissue. No radiating pain or neurological symptoms. -Start Meloxicam  for anti-inflammatory effect. -Continue alternating ice and heat therapy. -Consider use of a foam roller for potential IT band tension.  Fatigue Reports increased fatigue and sleep duration, with decreased energy and motivation. No clear etiology identified at this time. -Order comprehensive thyroid  evaluation to rule out thyroid  dysfunction as a cause of fatigue. -Continue current blood pressure medication (Olmesartan  Hydrochlorothiazide).  Thyroid  Nodules Biopsy scheduled for later this month. No current symptoms related to thyroid  nodules. -Await biopsy results and follow up as needed.  Hypertension Well controlled on current medication (Olmesartan  Hydrochlorothiazide). -Continue current medication. -Check kidney function, potassium, and sodium levels due to potential effects of diuretic medication.  General Health Maintenance -Continue current regimen of probiotic and multivitamin. -Plan for annual physical around June each year. -Refill prescription for Valacyclovir  for occasional breakouts. -Check lipid panel annually, next due in June 2025. -Follow up in 6 months or  as needed.    Patient Active Problem List   Diagnosis Date Noted   Other  fatigue 10/29/2023   Right hip pain 10/29/2023   Multiple thyroid  nodules 10/07/2023   Statin intolerance 08/28/2023   Aortic dilatation (HCC) 08/28/2023   DDD (degenerative disc disease), cervical 07/12/2023   Lipoma of skin and subcutaneous tissue of trunk 11/14/2021   Essential hypertension 09/28/2019   Hiatal hernia 08/15/2019   Osteoarthritis of knee 08/15/2019   Hyperlipidemia 05/17/2018   Seasonal allergies 09/17/2015   GAD (generalized anxiety disorder) 03/12/2015   Hx of herpes labialis 03/12/2015   GERD 07/14/2007   Past Medical History:  Diagnosis Date   Acute meniscal tear of left knee    treated by Dr Denys date pending   Allergy Unknow   [place]   Arthritis    Asthma    as a child   Baker's cyst of knee    left knee-treated by Dr Shari   Cataract 2024   Removed 2024   Complication of anesthesia    Patient states she woke during an endoscopy but no problems since then   GAD (generalized anxiety disorder) 03/12/2015   -with rare panic disorder related to caring for grandaughter    GERD (gastroesophageal reflux disease)    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   Hypertension    PONV (postoperative nausea and vomiting)    Seasonal allergies    Spider veins 11/23/2013   Past Surgical History:  Procedure Laterality Date   BREAST BIOPSY     left   COLONOSCOPY  2005   Dr Sheppard Finn   DENTAL SURGERY     Cyst on right bottom tooth   SUPRACLAVICAL NODE BIOPSY Left 09/01/2022   Procedure: EXCISION LIPOMA LEFT SUPRACLAVICAL FOSSA;  Surgeon: Kerrin Elspeth BROCKS, MD;  Location: [facility] OR;  Service: Thoracic;  Laterality: Left;   TOE SURGERY     TONSILLECTOMY AND ADENOIDECTOMY     UPPER GASTROINTESTINAL ENDOSCOPY     Social History   Tobacco Use   Smoking status: Never   Smokeless tobacco: Never  Vaping Use   Vaping status: Never Used  Substance Use Topics   Alcohol use: Yes    Alcohol/week: 8.0 standard drinks of alcohol    Types: 8 Glasses of wine per  week    Comment: Social   Drug use: No      ROS: as noted in HPI  Objective:     BP 135/76   Pulse 64   Ht 5' 5 (1.651 m)   Wt 164 lb 12.8 oz (74.8 kg)   LMP 10/20/2000   SpO2 99%   BMI 27.42 kg/m  BP Readings from Last 3 Encounters:  10/29/23 135/76  09/21/23 (!) 160/77  09/15/23 (!) 151/75   Wt Readings from Last 3 Encounters:  10/29/23 164 lb 12.8 oz (74.8 kg)  09/21/23 164 lb (74.4 kg)  09/15/23 161 lb (73 kg)      Physical Exam Vitals and nursing note reviewed.  Constitutional:      General: She is not in acute distress.    Appearance: Normal appearance. She is not ill-appearing, toxic-appearing or diaphoretic.  HENT:     Head: Normocephalic and atraumatic.     Right Ear: Tympanic membrane, ear canal and external ear normal. There is no impacted cerumen.     Left Ear: Tympanic membrane, ear canal and external ear normal. There is no impacted cerumen.     Nose: Nose normal.     Mouth/Throat:  Mouth: Mucous membranes are moist.     Pharynx: Oropharynx is clear. No oropharyngeal exudate or posterior oropharyngeal erythema.  Eyes:     General: No scleral icterus.       Right eye: No discharge.        Left eye: No discharge.     Extraocular Movements: Extraocular movements intact.     Pupils: Pupils are equal, round, and reactive to light.  Cardiovascular:     Rate and Rhythm: Normal rate and regular rhythm.     Pulses: Normal pulses.     Heart sounds: No murmur heard. Pulmonary:     Effort: Pulmonary effort is normal. No respiratory distress.     Breath sounds: Normal breath sounds. No stridor. No wheezing or rhonchi.  Abdominal:     General: Abdomen is flat. Bowel sounds are normal. There is no distension.     Palpations: Abdomen is soft. There is no mass.     Tenderness: There is no abdominal tenderness. There is no guarding.  Musculoskeletal:     Cervical back: Normal range of motion and neck supple. No rigidity or tenderness.     Lumbar back:  Normal. No swelling, deformity, spasms, tenderness or bony tenderness. Normal range of motion. Negative right straight leg raise test and negative left straight leg raise test. No scoliosis.     Right hip: Bony tenderness present. No lacerations or crepitus. Normal range of motion. Normal strength.     Left hip: No lacerations, tenderness, bony tenderness or crepitus. Normal range of motion. Normal strength.     Right upper leg: Tenderness (to lateral R thigh, primarily over IT band) present. No swelling or bony tenderness.     Left upper leg: Normal. No swelling or tenderness.     Right lower leg: No edema.     Left lower leg: No edema.       Legs:     Comments: No step-off deformity  Skin:    General: Skin is warm and dry.     Coloration: Skin is not jaundiced.     Findings: No bruising, erythema or rash.  Neurological:     General: No focal deficit present.     Mental Status: She is alert and oriented to person, place, and time.     Sensory: No sensory deficit.     Motor: No weakness.  Psychiatric:        Mood and Affect: Mood normal.        Behavior: Behavior normal.      No results found for any visits on 10/29/23.  Last CBC Lab Results  Component Value Date   WBC 7.1 06/06/2023   HGB 13.6 06/06/2023   HCT 40.1 06/06/2023   MCV 96.6 06/06/2023   MCH 32.8 06/06/2023   RDW 12.2 06/06/2023   PLT 219 06/06/2023   Last metabolic panel Lab Results  Component Value Date   GLUCOSE 88 06/06/2023   NA 136 06/06/2023   K 4.4 06/06/2023   CL 102 06/06/2023   CO2 27 06/06/2023   BUN 15 06/06/2023   CREATININE 0.65 06/06/2023   GFRNONAA >60 06/06/2023   CALCIUM  9.5 06/06/2023   PROT 6.3 (L) 08/28/2022   ALBUMIN 3.9 08/28/2022   BILITOT 0.9 08/28/2022   ALKPHOS 70 08/28/2022   AST 33 08/28/2022   ALT 27 08/28/2022   ANIONGAP 7 06/06/2023      The 10-year ASCVD risk score (Arnett DK, et al., 2019) is: 19%  Assessment & Plan:  Other fatigue Assessment &  Plan: Reports increased fatigue and sleep duration, with decreased energy and motivation. No clear etiology identified at this time. -Order comprehensive thyroid  evaluation to rule out thyroid  dysfunction as a cause of fatigue. -Continue current blood pressure medication (Olmesartan  Hydrochlorothiazide).   Essential hypertension Assessment & Plan: Hypertension Well controlled on current medication (Olmesartan  Hydrochlorothiazide). -Continue current medication. -Check kidney function, potassium, and sodium levels due to potential effects of diuretic medication.  Orders: -     Olmesartan  Medoxomil-HCTZ; Take 1 tablet by mouth daily.  Dispense: 90 tablet; Refill: 1  Multiple thyroid  nodules Assessment & Plan: Thyroid  Nodules Biopsy scheduled for later this month. No current symptoms related to thyroid  nodules. -Await biopsy results and follow up as needed.  Orders: -     TSH -     T3, free -     T4, free -     Thyroglobulin antibody  Long-term use of high-risk medication -     Comprehensive metabolic panel  Hx of herpes labialis -     valACYclovir  HCl; Take 2 tabs PO Q 12 hours x 1 day; start ASAP after symptom onset  Dispense: 20 tablet; Refill: 2  Right hip pain Assessment & Plan: Right Hip Pain Burning and stinging sensation localized to the right hip. Likely musculoskeletal in nature, possibly related to strain or partial tear of muscular tissue. No radiating pain or neurological symptoms. -Start Meloxicam  for anti-inflammatory effect. -Continue alternating ice and heat therapy. -Consider use of a foam roller for potential IT band tension.  Orders: -     Meloxicam ; Take 1 tablet (7.5 mg total) by mouth daily. Take with food  Dispense: 30 tablet; Refill: 0  Aortic dilatation (HCC) Assessment & Plan: Has been stable at 3.5cm over the past 2 years.      Return in about 6 months (around 04/27/2024) for Annual Physical.   Benton LITTIE Gave, PA

## 2023-10-29 NOTE — Assessment & Plan Note (Signed)
 Reports increased fatigue and sleep duration, with decreased energy and motivation. No clear etiology identified at this time. -Order comprehensive thyroid  evaluation to rule out thyroid  dysfunction as a cause of fatigue. -Continue current blood pressure medication (Olmesartan  Hydrochlorothiazide).

## 2023-10-29 NOTE — Assessment & Plan Note (Signed)
 Has been stable at 3.5cm over the past 2 years.

## 2023-10-29 NOTE — Assessment & Plan Note (Signed)
 Hypertension Well controlled on current medication (Olmesartan Hydrochlorothiazide). -Continue current medication. -Check kidney function, potassium, and sodium levels due to potential effects of diuretic medication.

## 2023-10-29 NOTE — Assessment & Plan Note (Signed)
 Thyroid Nodules Biopsy scheduled for later this month. No current symptoms related to thyroid nodules. -Await biopsy results and follow up as needed.

## 2023-10-29 NOTE — Patient Instructions (Signed)
 We updated some labs on you today. I did a more comprehensive lab eval for your thyroid . Keep your biopsy appointment on 11/20/23.  Start taking meloxicam  once daily to help with your hip pain. Take daily with food. You may take a second dose in the afternoon/ evening if needed. Do not take any additional OTC NSAIDS (advil , motrin , ibuprofen , aleve, naproxen).    Please do the attached exercises to help with your right iliotibial band. Use the foam roller technique discussed today.  Continue your home BP medication as prescribed.  Please return for an annual PE and fasting labs in about 6 months.

## 2023-10-29 NOTE — Assessment & Plan Note (Signed)
 Right Hip Pain Burning and stinging sensation localized to the right hip. Likely musculoskeletal in nature, possibly related to strain or partial tear of muscular tissue. No radiating pain or neurological symptoms. -Start Meloxicam  for anti-inflammatory effect. -Continue alternating ice and heat therapy. -Consider use of a foam roller for potential IT band tension.

## 2023-11-02 LAB — THYROGLOBULIN ANTIBODY: Thyroglobulin Ab: <1 [IU]/mL (ref <=1)

## 2023-11-06 ENCOUNTER — Encounter: Payer: Self-pay | Admitting: Urgent Care

## 2023-11-06 DIAGNOSIS — R32 Unspecified urinary incontinence: Secondary | ICD-10-CM

## 2023-11-11 ENCOUNTER — Other Ambulatory Visit: Payer: Medicare HMO

## 2023-11-20 ENCOUNTER — Other Ambulatory Visit: Payer: Self-pay | Admitting: Thoracic Surgery (Cardiothoracic Vascular Surgery)

## 2023-11-20 ENCOUNTER — Ambulatory Visit
Admission: RE | Admit: 2023-11-20 | Discharge: 2023-11-20 | Disposition: A | Discharge status: Home / Self Care | Payer: Medicare HMO | Source: Ambulatory Visit | Attending: Thoracic Surgery (Cardiothoracic Vascular Surgery) | Admitting: Thoracic Surgery (Cardiothoracic Vascular Surgery)

## 2023-11-20 DIAGNOSIS — E041 Nontoxic single thyroid nodule: Secondary | ICD-10-CM

## 2023-12-03 DIAGNOSIS — M1711 Unilateral primary osteoarthritis, right knee: Secondary | ICD-10-CM | POA: Diagnosis not present

## 2023-12-04 ENCOUNTER — Other Ambulatory Visit: Payer: Self-pay | Admitting: Urgent Care

## 2023-12-04 DIAGNOSIS — R32 Unspecified urinary incontinence: Secondary | ICD-10-CM

## 2023-12-04 NOTE — Telephone Encounter (Signed)
No further action needed.

## 2023-12-09 ENCOUNTER — Other Ambulatory Visit: Payer: Medicare HMO

## 2023-12-15 DIAGNOSIS — L814 Other melanin hyperpigmentation: Secondary | ICD-10-CM | POA: Diagnosis not present

## 2023-12-15 DIAGNOSIS — L578 Other skin changes due to chronic exposure to nonionizing radiation: Secondary | ICD-10-CM | POA: Diagnosis not present

## 2023-12-15 DIAGNOSIS — L821 Other seborrheic keratosis: Secondary | ICD-10-CM | POA: Diagnosis not present

## 2023-12-15 DIAGNOSIS — D225 Melanocytic nevi of trunk: Secondary | ICD-10-CM | POA: Diagnosis not present

## 2023-12-30 ENCOUNTER — Encounter: Payer: Self-pay | Admitting: Urgent Care

## 2023-12-30 ENCOUNTER — Ambulatory Visit (INDEPENDENT_AMBULATORY_CARE_PROVIDER_SITE_OTHER): Payer: Medicare HMO | Admitting: Urgent Care

## 2023-12-30 VITALS — BP 140/78 | HR 64 | Ht 65.0 in | Wt 160.4 lb

## 2023-12-30 DIAGNOSIS — I10 Essential (primary) hypertension: Secondary | ICD-10-CM | POA: Diagnosis not present

## 2023-12-30 DIAGNOSIS — E042 Nontoxic multinodular goiter: Secondary | ICD-10-CM | POA: Diagnosis not present

## 2023-12-30 DIAGNOSIS — R5383 Other fatigue: Secondary | ICD-10-CM

## 2023-12-30 DIAGNOSIS — Z13220 Encounter for screening for lipoid disorders: Secondary | ICD-10-CM

## 2023-12-30 DIAGNOSIS — Z0000 Encounter for general adult medical examination without abnormal findings: Secondary | ICD-10-CM

## 2023-12-30 DIAGNOSIS — Z131 Encounter for screening for diabetes mellitus: Secondary | ICD-10-CM

## 2023-12-30 LAB — COMPREHENSIVE METABOLIC PANEL
ALT: 23 U/L (ref 0–35)
AST: 21 U/L (ref 0–37)
Albumin: 4.4 g/dL (ref 3.5–5.2)
Alkaline Phosphatase: 75 U/L (ref 39–117)
BUN: 17 mg/dL (ref 6–23)
CO2: 30 meq/L (ref 19–32)
Calcium: 10 mg/dL (ref 8.4–10.5)
Chloride: 99 meq/L (ref 96–112)
Creatinine, Ser: 0.73 mg/dL (ref 0.40–1.20)
GFR: 81.27 mL/min (ref >60.00)
Glucose, Bld: 110 mg/dL — ABNORMAL HIGH (ref 70–99)
Potassium: 4.9 meq/L (ref 3.5–5.1)
Sodium: 136 meq/L (ref 135–145)
Total Bilirubin: 0.6 mg/dL (ref 0.2–1.2)
Total Protein: 6.6 g/dL (ref 6.0–8.3)

## 2023-12-30 LAB — CBC
HCT: 41.9 % (ref 36.0–46.0)
Hemoglobin: 13.8 g/dL (ref 12.0–15.0)
MCHC: 33 g/dL (ref 30.0–36.0)
MCV: 98.4 fl (ref 78.0–100.0)
Platelets: 276 10*3/uL (ref 150.0–400.0)
RBC: 4.26 Mil/uL (ref 3.87–5.11)
RDW: 12.8 % (ref 11.5–15.5)
WBC: 6.4 10*3/uL (ref 4.0–10.5)

## 2023-12-30 LAB — LIPID PANEL
Cholesterol: 249 mg/dL — ABNORMAL HIGH (ref 0–200)
HDL: 70.8 mg/dL (ref >39.00)
LDL Cholesterol: 142 mg/dL — ABNORMAL HIGH (ref 0–99)
NonHDL: 178.67
Total CHOL/HDL Ratio: 4
Triglycerides: 185 mg/dL — ABNORMAL HIGH (ref 0.0–149.0)
VLDL: 37 mg/dL (ref 0.0–40.0)

## 2023-12-30 LAB — HEMOGLOBIN A1C: Hgb A1c MFr Bld: 5.9 % (ref 4.6–6.5)

## 2023-12-30 NOTE — Patient Instructions (Signed)
 WE completed your annual physical and fasting labs.  You are up to date on all your preventive health maintenance.  Please schedule your repeat thyroid ultrasound around 09/16/24 at [facility].  Start taking your blood pressure medication in the morning.  Return in 6 months.

## 2023-12-30 NOTE — Progress Notes (Signed)
 Annual Wellness Visit     Patient: [name], Female    DOB: 1951/08/09, 73 y.o.   MRN: [id number]  Subjective  Chief Complaint  Patient presents with   Annual Exam    Fasting CPE    [name] is a 73 y.o. female who presents today for her Annual Wellness Visit. She reports consuming a general diet.  Pt plays golf 3x/ week.  She generally feels fairly well. She reports sleeping fairly well. She does have additional problems to discuss today.   HPI  She feels generally well but experiences tiredness due to frequently lifting her seventy-pound granddaughter, who has special needs. She engages in regular physical activity, playing golf three times a week and using a treadmill and weights at home. However, she has not been able to visit the gym due to time constraints. No current pain or significant health issues aside from tiredness. Regular bowel movements and no current dental issues. She follows up regularly with a dermatologist and has no significant skin concerns. (Had recent eval).  She experiences knee swelling after walking and received a cortisone shot, which provided significant relief. She has been informed that she is 'bone on bone' but is not yet ready for a total knee replacement.  She reports poor sleep quality, often feeling tired. She takes two 10 mg melatonin tablets at night, which helps her sleep but causes vivid dreams. She previously used over-the-counter sleep aids but stopped due to concerns about their long-term effects.  She has a history of cataract surgery in both eyes and currently experiences a clouding effect, which her eye doctor attributes to possible water buildup behind the lens. She has an eye appointment scheduled for tomorrow.  She has a history of thyroid nodules, discovered around Thanksgiving. A radiologist determined that the nodules were liquid and not suitable for biopsy. She was advised to return for a follow-up ultrasound in a year.  She  takes Benicar (olmesartan hydrochlorothiazide) for blood pressure management, usually at night. She notes frequent nighttime urination, which may be related to her medication timing.     Vision:Within last year and has an appointment tomorrow (has cataracts) and Dental: No current dental problems and Receives regular dental care   Patient Active Problem List   Diagnosis Date Noted   Other fatigue 10/29/2023   Right hip pain 10/29/2023   Multiple thyroid nodules 10/07/2023   Statin intolerance 08/28/2023   Aortic dilatation (HCC) 08/28/2023   DDD (degenerative disc disease), cervical 07/12/2023   Lipoma of skin and subcutaneous tissue of trunk 11/14/2021   Essential hypertension 09/28/2019   Hiatal hernia 08/15/2019   Osteoarthritis of knee 08/15/2019   Hyperlipidemia 05/17/2018   Seasonal allergies 09/17/2015   GAD (generalized anxiety disorder) 03/12/2015   Hx of herpes labialis 03/12/2015   GERD 07/14/2007   Past Medical History:  Diagnosis Date   Acute meniscal tear of left knee    treated by Dr Theador Hawthorne date pending   Allergy Unknow   Sulfer   Arthritis    Asthma    as a child   Baker's cyst of knee    left knee-treated by Dr Madelon Lips   Cataract 2024   Removed 2024   Complication of anesthesia    Patient states she woke during an endoscopy but no problems since then   GAD (generalized anxiety disorder) 03/12/2015   -with rare panic disorder related to caring for grandaughter    GERD (gastroesophageal reflux disease)    H/O cold  sores 03/12/2015   Hypertension    PONV (postoperative nausea and vomiting)    Seasonal allergies    Spider veins 11/23/2013   Past Surgical History:  Procedure Laterality Date   BREAST BIOPSY     left   COLONOSCOPY  2005   Dr Victorino Dike   DENTAL SURGERY     Cyst on right bottom tooth   SUPRACLAVICAL NODE BIOPSY Left 09/01/2022   Procedure: EXCISION LIPOMA LEFT SUPRACLAVICAL FOSSA;  Surgeon: Loreli Slot, MD;   Location: [facility] OR;  Service: Thoracic;  Laterality: Left;   TOE SURGERY     TONSILLECTOMY AND ADENOIDECTOMY     UPPER GASTROINTESTINAL ENDOSCOPY     Social History   Tobacco Use   Smoking status: Never   Smokeless tobacco: Never  Vaping Use   Vaping status: Never Used  Substance Use Topics   Alcohol use: Yes    Alcohol/week: 8.0 standard drinks of alcohol    Types: 8 Glasses of wine per week    Comment: Social   Drug use: No      Medications: Outpatient Medications Prior to Visit  Medication Sig   fluticasone (FLONASE) 50 MCG/ACT nasal spray Place 1 spray into both nostrils daily.   Multiple Vitamins-Minerals (HAIR/SKIN/NAILS/BIOTIN PO) Take 1 capsule by mouth daily.   Multiple Vitamins-Minerals (MULTIVITAMIN PO) Take 1 tablet by mouth daily.   olmesartan-hydrochlorothiazide (BENICAR HCT) 20-12.5 MG tablet Take 1 tablet by mouth daily.   Probiotic Product (PROBIOTIC PO) Take 1 capsule by mouth daily.   valACYclovir (VALTREX) 1000 MG tablet Take 2 tabs PO Q 12 hours x 1 day; start ASAP after symptom onset   [DISCONTINUED] lisinopril (ZESTRIL) 20 MG tablet Take 20 mg by mouth 2 (two) times daily. (Patient not taking: Reported on 12/30/2023)   [DISCONTINUED] meloxicam (MOBIC) 7.5 MG tablet Take 1 tablet (7.5 mg total) by mouth daily. Take with food (Patient not taking: Reported on 12/30/2023)   No facility-administered medications prior to visit.    Allergies  Allergen Reactions   Statins Other (See Comments)    myalgias   Sulfonamide Derivatives Hives   Zetia [Ezetimibe] Diarrhea   Cefdinir Rash    Patient Care Team: Maretta Bees, [place] as PCP - General (Physician Assistant) Antony Contras, MD as Consulting Physician (Ophthalmology) [place], Elvin So, MD as Consulting Physician (Dermatology) Frederico Hamman, MD as Consulting Physician (Orthopedic Surgery) Erroll Luna, [facility] (Inactive) as Pharmacist (Pharmacist)  ROS Complete 12 point ROS performed with all  pertinent positives listed in HPI    Objective  BP (!) 140/78   Pulse 64   Ht 5\' 5"  (1.651 m)   Wt 160 lb 6.4 oz (72.8 kg)   LMP 10/20/2000   SpO2 97%   BMI 26.69 kg/m  BP Readings from Last 3 Encounters:  12/30/23 (!) 140/78  10/29/23 135/76  09/21/23 (!) 160/77   Wt Readings from Last 3 Encounters:  12/30/23 160 lb 6.4 oz (72.8 kg)  10/29/23 164 lb 12.8 oz (74.8 kg)  09/21/23 164 lb (74.4 kg)   SpO2 Readings from Last 3 Encounters:  12/30/23 97%  10/29/23 99%  09/21/23 98%      Physical Exam Vitals and nursing note reviewed.  Constitutional:      General: She is not in acute distress.    Appearance: Normal appearance. She is not ill-appearing, toxic-appearing or diaphoretic.  HENT:     Head: Normocephalic and atraumatic.     Right Ear: Tympanic membrane, ear canal and external  ear normal. There is no impacted cerumen.     Left Ear: Tympanic membrane, ear canal and external ear normal. There is no impacted cerumen.     Nose: Nose normal.     Mouth/Throat:     Mouth: Mucous membranes are moist.     Pharynx: Oropharynx is clear. No oropharyngeal exudate or posterior oropharyngeal erythema.  Eyes:     General: No scleral icterus.       Right eye: No discharge.        Left eye: No discharge.     Extraocular Movements: Extraocular movements intact.     Pupils: Pupils are equal, round, and reactive to light.  Neck:     Thyroid: No thyroid mass, thyromegaly or thyroid tenderness.  Cardiovascular:     Rate and Rhythm: Normal rate and regular rhythm.     Pulses: Normal pulses.     Heart sounds: No murmur heard. Pulmonary:     Effort: Pulmonary effort is normal. No respiratory distress.     Breath sounds: Normal breath sounds. No stridor. No wheezing or rhonchi.  Abdominal:     General: Abdomen is flat. Bowel sounds are normal. There is no distension.     Palpations: Abdomen is soft. There is no mass.     Tenderness: There is no abdominal tenderness. There is no  guarding.  Musculoskeletal:     Cervical back: Normal range of motion and neck supple. No rigidity or tenderness.     Right lower leg: No edema.     Left lower leg: No edema.  Lymphadenopathy:     Cervical: No cervical adenopathy.  Skin:    General: Skin is warm and dry.     Coloration: Skin is not jaundiced.     Findings: No bruising, erythema or rash.  Neurological:     General: No focal deficit present.     Mental Status: She is alert and oriented to person, place, and time.     Sensory: No sensory deficit.     Motor: No weakness.  Psychiatric:        Mood and Affect: Mood normal.        Behavior: Behavior normal.       Most recent functional status assessment:    02/26/2023    8:49 AM  In your present state of health, do you have any difficulty performing the following activities:  Hearing? 0  Vision? 0  Difficulty concentrating or making decisions? 0  Walking or climbing stairs? 0  Dressing or bathing? 0  Doing errands, shopping? 0  Preparing Food and eating ? N  Using the Toilet? N  In the past six months, have you accidently leaked urine? Y  Comment urgency  Do you have problems with loss of bowel control? N  Managing your Medications? N  Managing your Finances? N  Housekeeping or managing your Housekeeping? N   Most recent fall risk assessment:    12/30/2023    8:17 AM  Fall Risk   Falls in the past year? 0  Number falls in past yr: 0  Injury with Fall? 0  Risk for fall due to : No Fall Risks  Follow up Falls evaluation completed    Most recent depression screenings:    12/30/2023    8:17 AM 08/27/2023    1:37 PM  PHQ 2/9 Scores  PHQ - 2 Score 0 0  PHQ- 9 Score 2 0   Most recent cognitive screening:    03/02/2023  10:46 AM  6CIT Screen  What Year? 0 points  What month? 0 points  What time? 0 points  Count back from 20 0 points  Months in reverse 0 points  Repeat phrase 0 points  Total Score 0 points   Most recent Audit-C alcohol use  screening    10/25/2023    8:24 PM  Alcohol Use Disorder Test (AUDIT)  1. How often do you have a drink containing alcohol? 3  2. How many drinks containing alcohol do you have on a typical day when you are drinking? 0  3. How often do you have six or more drinks on one occasion? 0  AUDIT-C Score 3   4. How often during the last year have you found that you were not able to stop drinking once you had started? 0  5. How often during the last year have you failed to do what was normally expected from you because of drinking? 0  6. How often during the last year have you needed a first drink in the morning to get yourself going after a heavy drinking session? 0  7. How often during the last year have you had a feeling of guilt of remorse after drinking? 0  8. How often during the last year have you been unable to remember what happened the night before because you had been drinking? 0  9. Have you or someone else been injured as a result of your drinking? 0  10. Has a relative or friend or a doctor or another health worker been concerned about your drinking or suggested you cut down? 0  Alcohol Use Disorder Identification Test Final Score (AUDIT) 3      Patient-reported   A score of 3 or more in women, and 4 or more in men indicates increased risk for alcohol abuse, EXCEPT if all of the points are from question 1   Vision/Hearing Screen: No results found.  Last CBC Lab Results  Component Value Date   WBC 7.1 06/06/2023   HGB 13.6 06/06/2023   HCT 40.1 06/06/2023   MCV 96.6 06/06/2023   MCH 32.8 06/06/2023   RDW 12.2 06/06/2023   PLT 219 06/06/2023   Last metabolic panel Lab Results  Component Value Date   GLUCOSE 93 10/29/2023   NA 136 10/29/2023   K 4.7 10/29/2023   CL 100 10/29/2023   CO2 29 10/29/2023   BUN 21 10/29/2023   CREATININE 0.75 10/29/2023   GFR 78.77 10/29/2023   CALCIUM 10.1 10/29/2023   PROT 6.8 10/29/2023   ALBUMIN 4.5 10/29/2023   BILITOT 0.4 10/29/2023    ALKPHOS 83 10/29/2023   AST 23 10/29/2023   ALT 23 10/29/2023   ANIONGAP 7 06/06/2023   Last lipids Lab Results  Component Value Date   CHOL 208 (H) 04/02/2023   HDL 55.60 04/02/2023   LDLCALC 122 (H) 04/02/2023   LDLDIRECT 89.0 02/04/2021   TRIG 153.0 (H) 04/02/2023   CHOLHDL 4 04/02/2023   Last hemoglobin A1c Lab Results  Component Value Date   HGBA1C 5.8 06/08/2017   Last thyroid functions Lab Results  Component Value Date   TSH 1.90 10/29/2023   Last vitamin D Lab Results  Component Value Date   VD25OH 36 10/31/2016   Last vitamin B12 and Folate No results found for: "VITAMINB12", "FOLATE"    No results found for any visits on 12/30/23.    Assessment & Plan   Annual wellness visit done today including the all  of the following: Reviewed patient's Family Medical History Reviewed and updated list of patient's medical providers Assessment of cognitive impairment was done Assessed patient's functional ability Established a written schedule for health screening services Health Risk Assessent Completed and Reviewed  Exercise Activities and Dietary recommendations  Goals      Patient Stated     Lose weight      Patient Stated     Lose weight      Patient Stated     Lose 15 lbs      Reduce caffeine intake     Reduce chocolate at bedtime                  Immunization History  Administered Date(s) Administered   Fluad Quad(high Dose 65+) 07/25/2019, 07/25/2020, 07/03/2021, 08/07/2022   Fluad Trivalent(High Dose 65+) 07/08/2023   Influenza Split 08/19/2011, 07/13/2012   Influenza Whole 07/21/2008   Influenza, High Dose Seasonal PF 08/21/2015, 07/16/2016, 07/31/2017, 07/21/2018, 07/03/2021, 06/29/2023   Influenza,inj,Quad PF,6+ Mos 07/12/2013, 07/27/2014   PFIZER(Purple Top)SARS-COV-2 Vaccination 12/02/2019, 12/24/2019, 08/15/2020   Pneumococcal Conjugate-13 03/12/2015   Pneumococcal Polysaccharide-23 07/16/2016   Respiratory Syncytial Virus  Vaccine,Recomb Aduvanted(Arexvy) 08/02/2023   Td 10/20/1996, 04/14/2008   Tdap 10/21/2011, 11/19/2022   Zoster Recombinant(Shingrix) 08/04/2019, 11/16/2019, 07/03/2021   Zoster, Live 10/07/2011, 11/15/2019    Health Maintenance  Topic Date Due   COVID-19 Vaccine (4 - 2024-25 season) 11/09/2024 (Originally 06/21/2023)   Medicare Annual Wellness (AWV)  03/01/2024   Colonoscopy  08/04/2024   MAMMOGRAM  02/15/2025   DTaP/Tdap/Td (5 - Td or Tdap) 11/19/2032   Pneumonia Vaccine 40+ Years old  Completed   INFLUENZA VACCINE  Completed   DEXA SCAN  Completed   Zoster Vaccines- Shingrix  Completed   Hepatitis C Screening  Addressed   HPV VACCINES  Aged Out     Discussed health benefits of physical activity, and encouraged her to engage in regular exercise appropriate for her age and condition.    Problem List Items Addressed This Visit       Cardiovascular and Mediastinum   Essential hypertension     Endocrine   Multiple thyroid nodules   Relevant Orders   US THYROID     Other   Other fatigue   Other Visit Diagnoses       Well adult health check    -  Primary   Relevant Orders   CBC   Hemoglobin A1c   Lipid panel   TSH   Comprehensive metabolic panel     Assessment and Plan    Wellness Visit Routine wellness visit. Reports general well-being with some fatigue due to caregiving and poor sleep. - Complete fasting blood work. - Schedule follow-up in six months for blood pressure check.  Hypertension Hypertension managed with olmesartan hydrochlorothiazide. Advised to take medication in the morning for improved control and reduced nocturia. - Advise morning administration of olmesartan hydrochlorothiazide.  Thyroid Nodules Thyroid nodules with spongiform and cystic characteristics. Biopsy not recommended. Plan to monitor with follow-up ultrasound in one year. - Order follow-up thyroid ultrasound for September 16, 2024, at [facility]. - Monitor thyroid function with  blood work.  Knee Osteoarthritis Knee swelling managed with cortisone injection. Diagnosed with bone-on-bone osteoarthritis but not a candidate for total knee replacement. - Consider further cortisone injections as needed.  Sleep Disturbance Experiences poor sleep, using melatonin. Prefers melatonin over over-the-counter sleep aids due to safety concerns. - Continue melatonin for sleep. - Avoid over-the-counter sleep aids like  Unisom.  General Health Maintenance Maintains regular dental and vision care. Engages in regular physical activity and follows a generally healthy diet. - Encourage continued regular dental and vision check-ups. - Promote regular physical activity and healthy diet.        Return in about 6 months (around 07/01/2024).     Maretta Bees, PA

## 2023-12-31 ENCOUNTER — Encounter: Payer: Self-pay | Admitting: Urgent Care

## 2023-12-31 DIAGNOSIS — H26493 Other secondary cataract, bilateral: Secondary | ICD-10-CM | POA: Diagnosis not present

## 2023-12-31 DIAGNOSIS — Z961 Presence of intraocular lens: Secondary | ICD-10-CM | POA: Diagnosis not present

## 2023-12-31 LAB — TSH: TSH: 1.77 u[IU]/mL (ref 0.35–5.50)

## 2024-02-11 ENCOUNTER — Encounter: Payer: Self-pay | Admitting: Urgent Care

## 2024-02-15 ENCOUNTER — Ambulatory Visit: Attending: Urgent Care | Admitting: Physical Therapy

## 2024-02-15 ENCOUNTER — Encounter: Payer: Self-pay | Admitting: Urgent Care

## 2024-02-15 ENCOUNTER — Ambulatory Visit (INDEPENDENT_AMBULATORY_CARE_PROVIDER_SITE_OTHER): Admitting: Urgent Care

## 2024-02-15 ENCOUNTER — Encounter: Payer: Self-pay | Admitting: Physical Therapy

## 2024-02-15 ENCOUNTER — Other Ambulatory Visit: Payer: Self-pay

## 2024-02-15 VITALS — BP 160/70 | HR 59 | Wt 162.0 lb

## 2024-02-15 DIAGNOSIS — I10 Essential (primary) hypertension: Secondary | ICD-10-CM | POA: Diagnosis not present

## 2024-02-15 DIAGNOSIS — M5459 Other low back pain: Secondary | ICD-10-CM | POA: Insufficient documentation

## 2024-02-15 DIAGNOSIS — M542 Cervicalgia: Secondary | ICD-10-CM | POA: Diagnosis not present

## 2024-02-15 DIAGNOSIS — Z7184 Encounter for health counseling related to travel: Secondary | ICD-10-CM

## 2024-02-15 DIAGNOSIS — R252 Cramp and spasm: Secondary | ICD-10-CM | POA: Diagnosis not present

## 2024-02-15 DIAGNOSIS — G4709 Other insomnia: Secondary | ICD-10-CM | POA: Diagnosis not present

## 2024-02-15 DIAGNOSIS — M6281 Muscle weakness (generalized): Secondary | ICD-10-CM | POA: Insufficient documentation

## 2024-02-15 DIAGNOSIS — M545 Low back pain, unspecified: Secondary | ICD-10-CM | POA: Diagnosis not present

## 2024-02-15 MED ORDER — AZITHROMYCIN 250 MG PO TABS: ORAL_TABLET | ORAL | 0 refills | Status: AC

## 2024-02-15 MED ORDER — ZOLPIDEM TARTRATE 5 MG PO TABS: 5.0000 mg | ORAL_TABLET | Freq: Every evening | ORAL | 0 refills | Status: DC | PRN

## 2024-02-15 MED ORDER — TIZANIDINE HCL 4 MG PO TABS: 4.0000 mg | ORAL_TABLET | Freq: Three times a day (TID) | ORAL | 0 refills | Status: DC | PRN

## 2024-02-15 NOTE — Therapy (Signed)
 OUTPATIENT PHYSICAL THERAPY THORACOLUMBAR EVALUATION   Patient Name: [name] MRN: [id number] DOB:1951/08/25, 73 y.o., female Today's Date: 02/15/2024  END OF SESSION:  PT End of Session - 02/15/24 1504     Visit Number 1    Date for PT Re-Evaluation 03/28/24    Authorization Type [employer] MCR [id number]    Progress Note Due on Visit 10    PT Start Time 1403    PT Stop Time 1440    PT Time Calculation (min) 37 min    Activity Tolerance Patient tolerated treatment well    Behavior During Therapy WFL for tasks assessed/performed             Past Medical History:  Diagnosis Date   Acute meniscal tear of left knee    treated by Dr Jeanmarie Millet date pending   Allergy Unknow   Sulfer   Arthritis    Asthma    as a child   Baker's cyst of knee    left knee-treated by Dr Marland Silvas   Cataract 2024   Removed 2024   Complication of anesthesia    Patient states she woke during an endoscopy but no problems since then   GAD (generalized anxiety disorder) 03/12/2015   -with rare panic disorder related to caring for grandaughter    GERD (gastroesophageal reflux disease)    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   Hypertension    PONV (postoperative nausea and vomiting)    Seasonal allergies    Spider veins 11/23/2013   Past Surgical History:  Procedure Laterality Date   BREAST BIOPSY     left   COLONOSCOPY  2005   Dr Nelson Bandy   DENTAL SURGERY     Cyst on right bottom tooth   SUPRACLAVICAL NODE BIOPSY Left 09/01/2022   Procedure: EXCISION LIPOMA LEFT SUPRACLAVICAL FOSSA;  Surgeon: Zelphia Higashi, MD;  Location: [facility] OR;  Service: Thoracic;  Laterality: Left;   TOE SURGERY     TONSILLECTOMY AND ADENOIDECTOMY     UPPER GASTROINTESTINAL ENDOSCOPY     Patient Active Problem List   Diagnosis Date Noted   Other fatigue 10/29/2023   Right hip pain 10/29/2023   Multiple thyroid  nodules 10/07/2023   Statin intolerance 08/28/2023   Aortic dilatation (HCC) 08/28/2023   DDD  (degenerative disc disease), cervical 07/12/2023   Lipoma of skin and subcutaneous tissue of trunk 11/14/2021   Essential hypertension 09/28/2019   Hiatal hernia 08/15/2019   Osteoarthritis of knee 08/15/2019   Hyperlipidemia 05/17/2018   Seasonal allergies 09/17/2015   GAD (generalized anxiety disorder) 03/12/2015   Hx of herpes labialis 03/12/2015   GERD 07/14/2007    PCP: Mandy Second, PA  REFERRING PROVIDER: Mandy Second, PA  REFERRING DIAG: M54.50 (ICD-10-CM) - Acute midline low back pain without sciatica  Rationale for Evaluation and Treatment: Rehabilitation  THERAPY DIAG:  Other low back pain  Cervicalgia  Cramp and spasm  Muscle weakness (generalized)  ONSET DATE: 02/11/2024  SUBJECTIVE:  SUBJECTIVE STATEMENT: Patient presents with increased back pain that began a week ago (02/11/2024). She did a lot of digging that day at her daughter's house in the garden. The next day she played couples golf which is something she normally does 3x a week. She went to the chiropractor for a maintenance visit and he was able to get her aligned. She has been to the chiropractor for two visits since her back started hurting but it did not solve the problem. Patient denies numbness and tingling down her leg. It feels like a gripping sensation in her low back. She has a trip planned May 12th to [place] and she wants to get her back right so she can walk. Since her back started hurting her neck has started hurting too. She has been to [facility] previously to address her neck pain. Her standing tolerance is limited to < 5 mins and her walking tolerance is limited to 15 mins.  PERTINENT HISTORY:  OA; HTN; hx cervical pain;   PAIN:  Are you having pain? Yes: NPRS scale: 6-7(currently) 10 (worst)/ 10 Pain  location: across SIJ  Pain description: tight; achy Aggravating factors: standing;   Relieving factors: bending; medication  PRECAUTIONS: None  RED FLAGS: None   WEIGHT BEARING RESTRICTIONS: No  FALLS:  Has patient fallen in last 6 months? No  LIVING ENVIRONMENT: Lives with: lives with their spouse Lives in: House/apartment Stairs:  4 steps to get into house; 14 internal but she normally doesn't go up there   OCCUPATION: Retired  PLOF: Independent, Independent with basic ADLs, Independent with gait, and Independent with transfers  PATIENT GOALS: To get rid of the tightness so I can stand up and play golf  NEXT MD VISIT: PRN  OBJECTIVE:  Note: Objective measures were completed at Evaluation unless otherwise noted.  DIAGNOSTIC FINDINGS:  None  PATIENT SURVEYS:  Modified Oswestry 14/50 28%   COGNITION: Overall cognitive status: Within functional limits for tasks assessed     SENSATION: WFL  MUSCLE LENGTH: Hamstrings: decreased bilateral Lt > Rt   POSTURE: forward head  PALPATION: Tenderness with palpation of SIJ; PA to SIJ relieved pain Increased muscle spasms of bilateral lumbar paraspinals & suboccipitals Tenderness with glute palpation    LUMBAR ROM: *tight  AROM eval  Flexion Bends to toes  Extension full  Right lateral flexion Bends to above knee joint*  Left lateral flexion Bends to above knee joint*  Right rotation full  Left rotation 60% limited   (Blank rows = not tested)  LOWER EXTREMITY ROM:   WFL bilateral    LOWER EXTREMITY MMT:  Grossly 4+/5     FUNCTIONAL TESTS:  5 times sit to stand: 11.67 sec no UE support Timed up and go (TUG): 9.72sec  GAIT: Comments: Unremarkable   TREATMENT DATE:  02/15/2024 Initial Evaluation & HEP created  Manual STM to bilateral lumbar paraspinals   PATIENT EDUCATION:   Education details: DN handout; examination findings; POC Person educated: Patient Education method: Explanation, Demonstration, and Handouts Education comprehension: verbalized understanding, returned demonstration, and needs further education  HOME EXERCISE PROGRAM: Access Code: [email or web address] URL: [email or web address] Date: 02/15/2024 Prepared by: Penelope Bowie  Exercises - Supine Lower Trunk Rotation  - 1 x daily - 7 x weekly - 1 sets - 10 reps - 5 hold - Supine Gluteus Stretch  - 1 x daily - 7 x weekly - 2 sets - 20-30 hold - Supine Hamstring Stretch with Strap  - 1 x daily - 7 x weekly - 2 sets - 20-30 hold - Supine Figure 4 Piriformis Stretch  - 1 x daily - 7 x weekly - 2 sets - 20-30 hold - Supine Piriformis Stretch  - 1 x daily - 7 x weekly - 2 sets - 20-30 hold - Standing Quadratus Lumborum Stretch with Doorway  - 1 x daily - 7 x weekly - 2 sets - 20-30 hold  ASSESSMENT:  CLINICAL IMPRESSION: Patient is a 73 y.o. female who was seen today for physical therapy evaluation and treatment for low back pain. Janiel presents to therapy with increased back pain that began a week ago after she did a lot of gardening. She went to a chiropractor for multiple treatments with no change in her symptoms. Due to her back pain her standing tolerance is limited to less than 5 mins and she is only able to comfortably walk for 15 minutes before the onset of pain. She has had dry needling in the past and she would like to try it again to address her muscle tightness. Patient has a trip planned May 12 to [place] and she would like to feel better by then. Noted increased muscle spasms and tenderness in gluteals and lumbar paraspinals. Patient is motivated and wants to get better. Patient will benefit from skilled PT to address the below impairments and improve overall function.   OBJECTIVE IMPAIRMENTS: decreased endurance, decreased ROM, decreased strength, increased muscle spasms, impaired  flexibility, impaired tone, postural dysfunction, and pain.   ACTIVITY LIMITATIONS: standing, sleeping, and walking  PARTICIPATION LIMITATIONS: cleaning, shopping, community activity, yard work, and [employer]  PERSONAL FACTORS: Age and 1-2 comorbidities: OA; HTN  are also affecting patient's functional outcome.   REHAB POTENTIAL: Good  CLINICAL DECISION MAKING: Stable/uncomplicated  EVALUATION COMPLEXITY: Low   GOALS: Goals reviewed with patient? Yes  SHORT TERM GOALS: Target date: 03/07/2024  Patient will be independent with initial HEP. Baseline:  Goal status: INITIAL  2.  Patient will report > or = to 40% improvement in back pain since starting PT. Baseline:  Goal status: INITIAL    LONG TERM GOALS: Target date: 03/28/2024  Patient will demonstrate independence in advanced HEP. Baseline:  Goal status: INITIAL  2.  Patient will report > or = to 80% improvement in back pain since starting PT. Baseline:  Goal status: INITIAL  3.  Patient will verbalize and demonstrate self-care strategies to manage pain including tissue mobility practices and change of position. Baseline:  Goal status: INITIAL   4.  Patient will be able to return to playing golf 3x week with < 2/10 back pain. Baseline:  Goal status: INITIAL  5.  Patient will be able to walk her normal working routine with her granddaughter with no back pain. Baseline: 15 minutes Goal status: INITIAL    PLAN:  PT FREQUENCY: 1-2x/week  PT DURATION: 6  weeks  PLANNED INTERVENTIONS: 97164- PT Re-evaluation, 97110-Therapeutic exercises, 97530- Therapeutic activity, [id number]- Neuromuscular re-education, 97535- Self Care, [id number]- Manual therapy, [phone]- Gait training, [phone]- Canalith repositioning, [id number]- Aquatic Therapy, [phone]- Electrical stimulation (unattended), [phone]- Electrical stimulation (manual), [id number]- Vasopneumatic device, [id number]- Ultrasound, [id number]- Traction (mechanical), [id number]- Ionotophoresis 4mg /ml  Dexamethasone , Patient/Family education, Balance training, Stair training, Taping, Dry Needling, Joint mobilization, Joint manipulation, Spinal manipulation, Spinal mobilization, Vestibular training, Cryotherapy, and Moist heat.  PLAN FOR NEXT SESSION: Review HEP; NuStep; DN suboccipitals, cervical multifidi, lumbar multifidi, gluteals; lumbar and hip mobility/ flexibility   Penelope Bowie, PT 02/15/24 3:05 PM [facility] [address], [address] [place], [place] [address] Phone # [phone] Fax [phone]

## 2024-02-15 NOTE — Patient Instructions (Addendum)
  Please purchase an over the counter lidocaine  patch. Wear this for up 12 hours. Take 1/2 tablet of the tizanidine every 8 hours as needed. If this is ineffective, you can try the full tablet, however take with caution as it can make you feel drowsy.  I have referred you to PT for dry needling of your back and neck. Continue TENS unit and moist heat to the area.  Please monitor blood pressure at home, and send me a Mychart message if it remains high.  I have called in Ambien for you to take on the flight. Do not exceed 5mg  in a 24 hour period. Take AS NEEDED. I have also called in an antibiotic for you to take only if signs of an infection occur while overseas.  Keep your appointment with me in September, return sooner as needed

## 2024-02-15 NOTE — Progress Notes (Signed)
 Established Patient Office Visit  Subjective:  Patient ID: [name], female    DOB: [birth year]  Age: 73 y.o. MRN: [id number]  Chief Complaint  Patient presents with   Back Pain    Pt has been having back pain and would like to have a referral to PT. She does have an appt tomorrow with the ortho. Pt will be going on a trip for 10 days and would like to see if she can get something to help her relax.    Back Pain    Discussed the use of AI scribe software for clinical note transcription with the patient, who gave verbal consent to proceed.  History of Present Illness   [name] is a 73 year old female who presents with persistent lower back pain.  She has been experiencing persistent lower back pain for over ten days, localized to the sacrum without radiation to the legs. The pain is described as muscular, preventing her from standing up straight initially, but there is no pain in the buttocks or spine. She has been using a heating pad, ice, and a massage bed at home for symptom relief.  She has visited her chiropractor three times, which has helped her stand straight, but the pain persists. She attempted to use an old oxycodone  prescription for neck pain, which caused nausea. She has not used muscle relaxers recently but has used them in the past for neck issues. The pain initially disrupted her sleep, but chiropractic treatment has improved her ability to sleep. No changes in bowel or bladder function and no radiating pain down the legs. She engages in physical activities like planks, but her hamstrings have started to bother her.  She has an upcoming appointment with an orthopedic specialist for further evaluation and potential x-rays. She has a history of knee issues and received a knee injection about a month ago after a fall at a golf course.  She is preparing for a trip to [place] and is concerned about managing her pain and sleep during the flight. She has previously used  Ambien successfully for sleep during flights and is seeking a similar solution for her upcoming travel. Additionally she is requesting to have abx on hand in case of travelers diarrhea or similar infection.  She reports elevated blood pressure, which she attributes to pain and lack of sleep. She monitors her blood pressure at home, with recent readings around 135/70. She is also experiencing muscle tension in her neck, which she attributes to posture and dehydration.      Patient Active Problem List   Diagnosis Date Noted   Other fatigue 10/29/2023   Right hip pain 10/29/2023   Multiple thyroid  nodules 10/07/2023   Statin intolerance 08/28/2023   Aortic dilatation (HCC) 08/28/2023   DDD (degenerative disc disease), cervical 07/12/2023   Lipoma of skin and subcutaneous tissue of trunk 11/14/2021   Essential hypertension 09/28/2019   Hiatal hernia 08/15/2019   Osteoarthritis of knee 08/15/2019   Hyperlipidemia 05/17/2018   Seasonal allergies 09/17/2015   GAD (generalized anxiety disorder) 03/12/2015   Hx of herpes labialis 03/12/2015   GERD 07/14/2007   Past Medical History:  Diagnosis Date   Acute meniscal tear of left knee    treated by Dr Jeanmarie Millet date pending   Allergy Unknow   Sulfer   Arthritis    Asthma    as a child   Baker's cyst of knee    left knee-treated by Dr Marland Silvas   Cataract 2024   Removed  2024   Complication of anesthesia    Patient states she woke during an endoscopy but no problems since then   GAD (generalized anxiety disorder) 03/12/2015   -with rare panic disorder related to caring for grandaughter    GERD (gastroesophageal reflux disease)    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   Hypertension    PONV (postoperative nausea and vomiting)    Seasonal allergies    Spider veins 11/23/2013   Past Surgical History:  Procedure Laterality Date   BREAST BIOPSY     left   COLONOSCOPY  2005   Dr Nelson Bandy   DENTAL SURGERY     Cyst on right bottom tooth    SUPRACLAVICAL NODE BIOPSY Left 09/01/2022   Procedure: EXCISION LIPOMA LEFT SUPRACLAVICAL FOSSA;  Surgeon: Zelphia Higashi, MD;  Location: [facility] OR;  Service: Thoracic;  Laterality: Left;   TOE SURGERY     TONSILLECTOMY AND ADENOIDECTOMY     UPPER GASTROINTESTINAL ENDOSCOPY     Social History   Tobacco Use   Smoking status: Never   Smokeless tobacco: Never  Vaping Use   Vaping status: Never Used  Substance Use Topics   Alcohol use: Yes    Alcohol/week: 8.0 standard drinks of alcohol    Types: 8 Glasses of wine per week    Comment: Social   Drug use: No      ROS: as noted in HPI  Objective:     BP (!) 160/70   Pulse (!) 59   Wt 162 lb (73.5 kg)   LMP 10/20/2000   SpO2 99%   BMI 26.96 kg/m  BP Readings from Last 3 Encounters:  02/15/24 (!) 160/70  12/30/23 (!) 140/78  10/29/23 135/76   Wt Readings from Last 3 Encounters:  02/15/24 162 lb (73.5 kg)  12/30/23 160 lb 6.4 oz (72.8 kg)  10/29/23 164 lb 12.8 oz (74.8 kg)      Physical Exam Vitals and nursing note reviewed.  Constitutional:      General: She is not in acute distress.    Appearance: Normal appearance. She is not ill-appearing, toxic-appearing or diaphoretic.  Eyes:     General:        Right eye: No discharge.        Left eye: No discharge.     Extraocular Movements: Extraocular movements intact.     Pupils: Pupils are equal, round, and reactive to light.  Cardiovascular:     Rate and Rhythm: Normal rate.  Musculoskeletal:        General: Tenderness (paralumbar muscles) present. No swelling or deformity. Normal range of motion.     Cervical back: Normal range of motion and neck supple. Tenderness (knots on the R paracervicals) present. No rigidity or bony tenderness.     Thoracic back: No tenderness or bony tenderness.     Lumbar back: Tenderness present. No swelling, edema or bony tenderness. Negative right straight leg raise test and negative left straight leg raise test.     Right lower leg:  No edema.     Left lower leg: No edema.  Lymphadenopathy:     Cervical: No cervical adenopathy.  Skin:    General: Skin is warm and dry.     Coloration: Skin is not jaundiced.     Findings: No erythema or rash.  Neurological:     General: No focal deficit present.     Mental Status: She is alert and oriented to person, place, and time.  Sensory: No sensory deficit.     Motor: No weakness.     Gait: Gait normal.  Psychiatric:        Mood and Affect: Mood normal.        Behavior: Behavior normal.      No results found for any visits on 02/15/24.  Last CBC Lab Results  Component Value Date   WBC 6.4 12/30/2023   HGB 13.8 12/30/2023   HCT 41.9 12/30/2023   MCV 98.4 12/30/2023   MCH 32.8 06/06/2023   RDW 12.8 12/30/2023   PLT 276.0 12/30/2023   Last metabolic panel Lab Results  Component Value Date   GLUCOSE 110 (H) 12/30/2023   NA 136 12/30/2023   K 4.9 12/30/2023   CL 99 12/30/2023   CO2 30 12/30/2023   BUN 17 12/30/2023   CREATININE 0.73 12/30/2023   GFR 81.27 12/30/2023   CALCIUM  10.0 12/30/2023   PROT 6.6 12/30/2023   ALBUMIN 4.4 12/30/2023   BILITOT 0.6 12/30/2023   ALKPHOS 75 12/30/2023   AST 21 12/30/2023   ALT 23 12/30/2023   ANIONGAP 7 06/06/2023      The 10-year ASCVD risk score (Arnett DK, et al., 2019) is: 28.8%  Assessment & Plan:  Acute midline low back pain without sciatica -     tiZANidine HCl; Take 1 tablet (4 mg total) by mouth every 8 (eight) hours as needed for muscle spasms.  Dispense: 30 tablet; Refill: 0 -     Ambulatory referral to Physical Therapy  Other insomnia -     Zolpidem Tartrate; Take 1 tablet (5 mg total) by mouth at bedtime as needed for sleep.  Dispense: 10 tablet; Refill: 0  Counseling about travel -     Azithromycin ; Take 2 tablets on day 1, then 1 tablet daily on days 2 through 5  Dispense: 6 tablet; Refill: 0  Essential hypertension  Assessment and Plan    Low back pain Chronic low back pain exacerbated  by yard work, localized to sacrum and hips. Improved with chiropractic care but persistent muscle tightness. No bowel or bladder changes. Discussed tizanidine and over-the-counter options. - Prescribe tizanidine, start with half a tablet to assess drowsiness. - Recommend over-the-counter Salonpas lidoderm  patch for pain relief. - Continue using TENS unit at home. - Refer to [facility] for dry needling and other therapies. - Keep orthopedic appointment for further evaluation and possible imaging.  Hypertension Blood pressure elevated likely due to acute pain and poor sleep. Home readings around 135/unknown. - Monitor blood pressure at home and report readings. - Reassess blood pressure after starting tizanidine.  Sleep disturbance Requests medication for sleep during flight to [place]. Previous use of Ambien without side effects. Advised against long-term use but agreed to short-term use for travel. - Prescribe Ambien for use during flight to [place], with instructions to use only as needed for sleep.  General Health Maintenance Inquired about antibiotics for travel. Discussed inappropriate use of antibiotics for travel-related illness. Advised against routine use unless necessary. - Prescribe azithromycin  for travel-related illness, with instructions to use only if signs of infection occur.         No follow-ups on file.   Mandy Second, PA

## 2024-02-15 NOTE — Patient Instructions (Signed)

## 2024-02-16 ENCOUNTER — Ambulatory Visit

## 2024-02-16 DIAGNOSIS — M6281 Muscle weakness (generalized): Secondary | ICD-10-CM | POA: Diagnosis not present

## 2024-02-16 DIAGNOSIS — M5459 Other low back pain: Secondary | ICD-10-CM | POA: Diagnosis not present

## 2024-02-16 DIAGNOSIS — M542 Cervicalgia: Secondary | ICD-10-CM

## 2024-02-16 DIAGNOSIS — R252 Cramp and spasm: Secondary | ICD-10-CM

## 2024-02-16 DIAGNOSIS — M545 Low back pain, unspecified: Secondary | ICD-10-CM | POA: Diagnosis not present

## 2024-02-16 NOTE — Therapy (Signed)
 OUTPATIENT PHYSICAL THERAPY TREATMENT   Patient Name: [name] MRN: [id number] DOB:September 30, 1951, 73 y.o., female Today's Date: 02/16/2024  END OF SESSION:  PT End of Session - 02/16/24 1100     Visit Number 2    Date for PT Re-Evaluation 03/28/24    Authorization Type [employer] MCR [id number]    Progress Note Due on Visit 10    PT Start Time 1017    PT Stop Time 1059    PT Time Calculation (min) 42 min    Activity Tolerance Patient tolerated treatment well    Behavior During Therapy WFL for tasks assessed/performed              Past Medical History:  Diagnosis Date   Acute meniscal tear of left knee    treated by Dr Jeanmarie Millet date pending   Allergy Unknow   Sulfer   Arthritis    Asthma    as a child   Baker's cyst of knee    left knee-treated by Dr Marland Silvas   Cataract 2024   Removed 2024   Complication of anesthesia    Patient states she woke during an endoscopy but no problems since then   GAD (generalized anxiety disorder) 03/12/2015   -with rare panic disorder related to caring for grandaughter    GERD (gastroesophageal reflux disease)    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   Hypertension    PONV (postoperative nausea and vomiting)    Seasonal allergies    Spider veins 11/23/2013   Past Surgical History:  Procedure Laterality Date   BREAST BIOPSY     left   COLONOSCOPY  2005   Dr Nelson Bandy   DENTAL SURGERY     Cyst on right bottom tooth   SUPRACLAVICAL NODE BIOPSY Left 09/01/2022   Procedure: EXCISION LIPOMA LEFT SUPRACLAVICAL FOSSA;  Surgeon: Zelphia Higashi, MD;  Location: [facility] OR;  Service: Thoracic;  Laterality: Left;   TOE SURGERY     TONSILLECTOMY AND ADENOIDECTOMY     UPPER GASTROINTESTINAL ENDOSCOPY     Patient Active Problem List   Diagnosis Date Noted   Other fatigue 10/29/2023   Right hip pain 10/29/2023   Multiple thyroid  nodules 10/07/2023   Statin intolerance 08/28/2023   Aortic dilatation (HCC) 08/28/2023   DDD (degenerative disc  disease), cervical 07/12/2023   Lipoma of skin and subcutaneous tissue of trunk 11/14/2021   Essential hypertension 09/28/2019   Hiatal hernia 08/15/2019   Osteoarthritis of knee 08/15/2019   Hyperlipidemia 05/17/2018   Seasonal allergies 09/17/2015   GAD (generalized anxiety disorder) 03/12/2015   Hx of herpes labialis 03/12/2015   GERD 07/14/2007    PCP: Mandy Second, PA  REFERRING PROVIDER: Mandy Second, PA  REFERRING DIAG: M54.50 (ICD-10-CM) - Acute midline low back pain without sciatica  Rationale for Evaluation and Treatment: Rehabilitation  THERAPY DIAG:  Other low back pain  Cervicalgia  Cramp and spasm  Muscle weakness (generalized)  ONSET DATE: 02/11/2024  SUBJECTIVE:  SUBJECTIVE STATEMENT: I took a muscle relaxer and slept a lot.  Didn't have a chance to do the exercises.   From eval: Patient presents with increased back pain that began a week ago (02/11/2024). She did a lot of digging that day at her daughter's house in the garden. The next day she played couples golf which is something she normally does 3x a week. She went to the chiropractor for a maintenance visit and he was able to get her aligned. She has been to the chiropractor for two visits since her back started hurting but it did not solve the problem. Patient denies numbness and tingling down her leg. It feels like a gripping sensation in her low back. She has a trip planned May 12th to [place] and she wants to get her back right so she can walk. Since her back started hurting her neck has started hurting too. She has been to [facility] previously to address her neck pain. Her standing tolerance is limited to < 5 mins and her walking tolerance is limited to 15 mins.  PERTINENT HISTORY:  OA; HTN; hx cervical pain;   PAIN:  02/16/24 Are you having pain? Yes: NPRS scale: 6-7(currently) 10 (worst)/ 10 Pain location: across SIJ  Pain description: tight; achy Aggravating factors: standing;   Relieving factors: bending; medication  PRECAUTIONS: None  RED FLAGS: None   WEIGHT BEARING RESTRICTIONS: No  FALLS:  Has patient fallen in last 6 months? No  LIVING ENVIRONMENT: Lives with: lives with their spouse Lives in: House/apartment Stairs:  4 steps to get into house; 14 internal but she normally doesn't go up there   OCCUPATION: Retired  PLOF: Independent, Independent with basic ADLs, Independent with gait, and Independent with transfers  PATIENT GOALS: To get rid of the tightness so I can stand up and play golf  NEXT MD VISIT: PRN  OBJECTIVE:  Note: Objective measures were completed at Evaluation unless otherwise noted.  DIAGNOSTIC FINDINGS:  None  PATIENT SURVEYS:  Modified Oswestry 14/50 28%   COGNITION: Overall cognitive status: Within functional limits for tasks assessed     SENSATION: WFL  MUSCLE LENGTH: Hamstrings: decreased bilateral Lt > Rt   POSTURE: forward head  PALPATION: Tenderness with palpation of SIJ; PA to SIJ relieved pain Increased muscle spasms of bilateral lumbar paraspinals & suboccipitals Tenderness with glute palpation    LUMBAR ROM: *tight  AROM eval  Flexion Bends to toes  Extension full  Right lateral flexion Bends to above knee joint*  Left lateral flexion Bends to above knee joint*  Right rotation full  Left rotation 60% limited   (Blank rows = not tested)  LOWER EXTREMITY ROM:   WFL bilateral    LOWER EXTREMITY MMT:  Grossly 4+/5     FUNCTIONAL TESTS:  5 times sit to stand: 11.67 sec no UE support Timed up and go (TUG): 9.72sec  GAIT: Comments: Unremarkable   TREATMENT DATE:   02/16/24:  NuStep: level 7x 6 minutes-PT present to discuss progress  Low trunk rotation 3x20 seconds  Supine hamstring stretch with strap 3x20 seconds   Supine piriformis stretch 3x20 both positions  Trigger Point Dry Needling  Initial Treatment: Pt instructed on Dry Needling rational, procedures, and possible side effects. Pt instructed to expect mild to moderate muscle soreness later in the day and/or into the next day.  Pt instructed in methods to reduce muscle soreness. Pt instructed to continue prescribed HEP. Because Dry Needling was performed over or adjacent to a lung  field, pt was educated on S/S of pneumothorax and to seek immediate medical attention should they occur.  Patient was educated on signs and symptoms of infection and other risk factors and advised to seek medical attention should they occur.  Patient verbalized understanding of these instructions and education.   Patient Verbal Consent Given: Yes Education Handout Provided: Previously Provided Muscles Treated: bil lumbar multifidi, Rt gluteals  Electrical Stimulation Performed: No Treatment Response/Outcome: twitch and improved tissue mobility.  Elongation and release to muscles treated   02/15/2024 Initial Evaluation & HEP created                                                                                                                              Manual STM to bilateral lumbar paraspinals   PATIENT EDUCATION:  Education details: DN handout; examination findings; POC Person educated: Patient Education method: Explanation, Demonstration, and Handouts Education comprehension: verbalized understanding, returned demonstration, and needs further education  HOME EXERCISE PROGRAM: Access Code: [email or web address] URL: [email or web address] Date: 02/15/2024 Prepared by: Penelope Bowie  Exercises - Supine Lower Trunk Rotation  - 1 x daily - 7 x weekly - 1 sets - 10 reps - 5 hold - Supine Gluteus Stretch  - 1 x daily - 7 x weekly - 2 sets - 20-30 hold - Supine Hamstring Stretch with Strap  - 1 x daily - 7 x weekly - 2 sets - 20-30 hold - Supine Figure 4  Piriformis Stretch  - 1 x daily - 7 x weekly - 2 sets - 20-30 hold - Supine Piriformis Stretch  - 1 x daily - 7 x weekly - 2 sets - 20-30 hold - Standing Quadratus Lumborum Stretch with Doorway  - 1 x daily - 7 x weekly - 2 sets - 20-30 hold  ASSESSMENT:  CLINICAL IMPRESSION: Pt was here yesterday for evaluation.  Session spent reviewing HEP and dry needling for tissue mobility.  Good response to dry needling with improved tissue mobility and twitch response in all areas treated.  PT monitored throughout session for pain and technique.  Patient is motivated and wants to get better. Patient will benefit from skilled PT to address the below impairments and improve overall function.   OBJECTIVE IMPAIRMENTS: decreased endurance, decreased ROM, decreased strength, increased muscle spasms, impaired flexibility, impaired tone, postural dysfunction, and pain.   ACTIVITY LIMITATIONS: standing, sleeping, and walking  PARTICIPATION LIMITATIONS: cleaning, shopping, community activity, yard work, and [employer]  PERSONAL FACTORS: Age and 1-2 comorbidities: OA; HTN  are also affecting patient's functional outcome.   REHAB POTENTIAL: Good  CLINICAL DECISION MAKING: Stable/uncomplicated  EVALUATION COMPLEXITY: Low   GOALS: Goals reviewed with patient? Yes  SHORT TERM GOALS: Target date: 03/07/2024  Patient will be independent with initial HEP. Baseline:  Goal status: INITIAL  2.  Patient will report > or = to 40% improvement in back pain since starting PT. Baseline:  Goal status: INITIAL    LONG TERM GOALS:  Target date: 03/28/2024  Patient will demonstrate independence in advanced HEP. Baseline:  Goal status: INITIAL  2.  Patient will report > or = to 80% improvement in back pain since starting PT. Baseline:  Goal status: INITIAL  3.  Patient will verbalize and demonstrate self-care strategies to manage pain including tissue mobility practices and change of position. Baseline:   Goal status: INITIAL   4.  Patient will be able to return to playing golf 3x week with < 2/10 back pain. Baseline:  Goal status: INITIAL  5.  Patient will be able to walk her normal working routine with her granddaughter with no back pain. Baseline: 15 minutes Goal status: INITIAL    PLAN:  PT FREQUENCY: 1-2x/week  PT DURATION: 6 weeks  PLANNED INTERVENTIONS: 97164- PT Re-evaluation, 97110-Therapeutic exercises, 97530- Therapeutic activity, [id number]- Neuromuscular re-education, 97535- Self Care, 82956- Manual therapy, [phone]- Gait training, [phone]- Canalith repositioning, [id number]- Aquatic Therapy, [phone]- Electrical stimulation (unattended), [phone]- Electrical stimulation (manual), [id number]- Vasopneumatic device, [id number]- Ultrasound, [id number]- Traction (mechanical), [id number]- Ionotophoresis 4mg /ml Dexamethasone , Patient/Family education, Balance training, Stair training, Taping, Dry Needling, Joint mobilization, Joint manipulation, Spinal manipulation, Spinal mobilization, Vestibular training, Cryotherapy, and Moist heat.  PLAN FOR NEXT SESSION: Review HEP; NuStep; DN suboccipitals, cervical multifidi, lumbar multifidi, gluteals; lumbar and hip mobility/ flexibility   Luella Sager, PT 02/16/24 11:01 AM  [facility] [address] [place], [place] [address] Phone # [phone] Fax [phone]

## 2024-02-16 NOTE — Patient Instructions (Signed)

## 2024-02-17 ENCOUNTER — Ambulatory Visit: Admitting: Family Medicine

## 2024-02-18 DIAGNOSIS — H26491 Other secondary cataract, right eye: Secondary | ICD-10-CM | POA: Diagnosis not present

## 2024-02-18 DIAGNOSIS — H26492 Other secondary cataract, left eye: Secondary | ICD-10-CM | POA: Diagnosis not present

## 2024-02-23 ENCOUNTER — Ambulatory Visit: Attending: Urgent Care

## 2024-02-23 DIAGNOSIS — M6281 Muscle weakness (generalized): Secondary | ICD-10-CM | POA: Diagnosis not present

## 2024-02-23 DIAGNOSIS — M5459 Other low back pain: Secondary | ICD-10-CM | POA: Insufficient documentation

## 2024-02-23 DIAGNOSIS — M542 Cervicalgia: Secondary | ICD-10-CM | POA: Diagnosis not present

## 2024-02-23 DIAGNOSIS — R252 Cramp and spasm: Secondary | ICD-10-CM | POA: Insufficient documentation

## 2024-02-23 NOTE — Therapy (Signed)
 OUTPATIENT PHYSICAL THERAPY TREATMENT   Patient Name: [name] MRN: [id number] DOB:Aug 07, 1951, 73 y.o., female Today's Date: 02/23/2024  END OF SESSION:  PT End of Session - 02/23/24 1056     Visit Number 3    Date for PT Re-Evaluation 03/28/24    Authorization Type [employer] MCR [id number]    Progress Note Due on Visit 10    PT Start Time 1016    PT Stop Time 1058    PT Time Calculation (min) 42 min    Activity Tolerance Patient tolerated treatment well    Behavior During Therapy WFL for tasks assessed/performed               Past Medical History:  Diagnosis Date   Acute meniscal tear of left knee    treated by Dr Jeanmarie Millet date pending   Allergy Unknow   Sulfer   Arthritis    Asthma    as a child   Baker's cyst of knee    left knee-treated by Dr Marland Silvas   Cataract 2024   Removed 2024   Complication of anesthesia    Patient states she woke during an endoscopy but no problems since then   GAD (generalized anxiety disorder) 03/12/2015   -with rare panic disorder related to caring for grandaughter    GERD (gastroesophageal reflux disease)    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   Hypertension    PONV (postoperative nausea and vomiting)    Seasonal allergies    Spider veins 11/23/2013   Past Surgical History:  Procedure Laterality Date   BREAST BIOPSY     left   COLONOSCOPY  2005   Dr Nelson Bandy   DENTAL SURGERY     Cyst on right bottom tooth   SUPRACLAVICAL NODE BIOPSY Left 09/01/2022   Procedure: EXCISION LIPOMA LEFT SUPRACLAVICAL FOSSA;  Surgeon: Zelphia Higashi, MD;  Location: [facility] OR;  Service: Thoracic;  Laterality: Left;   TOE SURGERY     TONSILLECTOMY AND ADENOIDECTOMY     UPPER GASTROINTESTINAL ENDOSCOPY     Patient Active Problem List   Diagnosis Date Noted   Other fatigue 10/29/2023   Right hip pain 10/29/2023   Multiple thyroid  nodules 10/07/2023   Statin intolerance 08/28/2023   Aortic dilatation (HCC) 08/28/2023   DDD (degenerative disc  disease), cervical 07/12/2023   Lipoma of skin and subcutaneous tissue of trunk 11/14/2021   Essential hypertension 09/28/2019   Hiatal hernia 08/15/2019   Osteoarthritis of knee 08/15/2019   Hyperlipidemia 05/17/2018   Seasonal allergies 09/17/2015   GAD (generalized anxiety disorder) 03/12/2015   Hx of herpes labialis 03/12/2015   GERD 07/14/2007    PCP: Mandy Second, PA  REFERRING PROVIDER: Mandy Second, PA  REFERRING DIAG: M54.50 (ICD-10-CM) - Acute midline low back pain without sciatica  Rationale for Evaluation and Treatment: Rehabilitation  THERAPY DIAG:  Other low back pain  Cervicalgia  Cramp and spasm  Muscle weakness (generalized)  ONSET DATE: 02/11/2024  SUBJECTIVE:  SUBJECTIVE STATEMENT: The dry needling helped me, I had less pain after.  60% better overall.  I had a lot of pain on Sunday after church.  I leave for my trip on Monday.   From eval: Patient presents with increased back pain that began a week ago (02/11/2024). She did a lot of digging that day at her daughter's house in the garden. The next day she played couples golf which is something she normally does 3x a week. She went to the chiropractor for a maintenance visit and he was able to get her aligned. She has been to the chiropractor for two visits since her back started hurting but it did not solve the problem. Patient denies numbness and tingling down her leg. It feels like a gripping sensation in her low back. She has a trip planned May 12th to [place] and she wants to get her back right so she can walk. Since her back started hurting her neck has started hurting too. She has been to [facility] previously to address her neck pain. Her standing tolerance is limited to < 5 mins and her walking tolerance is limited to 15  mins.  PERTINENT HISTORY:  OA; HTN; hx cervical pain;   PAIN: 02/23/24 Are you having pain? Yes: NPRS scale: 0-8/10 Pain location: across SIJ  Pain description: tight; achy Aggravating factors: standing;   Relieving factors: bending; medication  PRECAUTIONS: None  RED FLAGS: None   WEIGHT BEARING RESTRICTIONS: No  FALLS:  Has patient fallen in last 6 months? No  LIVING ENVIRONMENT: Lives with: lives with their spouse Lives in: House/apartment Stairs:  4 steps to get into house; 14 internal but she normally doesn't go up there   OCCUPATION: Retired  PLOF: Independent, Independent with basic ADLs, Independent with gait, and Independent with transfers  PATIENT GOALS: To get rid of the tightness so I can stand up and play golf  NEXT MD VISIT: PRN  OBJECTIVE:  Note: Objective measures were completed at Evaluation unless otherwise noted.  DIAGNOSTIC FINDINGS:  None  PATIENT SURVEYS:  Modified Oswestry 14/50 28%   COGNITION: Overall cognitive status: Within functional limits for tasks assessed     SENSATION: WFL  MUSCLE LENGTH: Hamstrings: decreased bilateral Lt > Rt   POSTURE: forward head  PALPATION: Tenderness with palpation of SIJ; PA to SIJ relieved pain Increased muscle spasms of bilateral lumbar paraspinals & suboccipitals Tenderness with glute palpation    LUMBAR ROM: *tight  AROM eval  Flexion Bends to toes  Extension full  Right lateral flexion Bends to above knee joint*  Left lateral flexion Bends to above knee joint*  Right rotation full  Left rotation 60% limited   (Blank rows = not tested)  LOWER EXTREMITY ROM:   WFL bilateral    LOWER EXTREMITY MMT:  Grossly 4+/5     FUNCTIONAL TESTS:  5 times sit to stand: 11.67 sec no UE support Timed up and go (TUG): 9.72sec  GAIT: Comments: Unremarkable   TREATMENT DATE:    02/16/24:  NuStep: level 7x 6 minutes-PT present to discuss progress  Low trunk rotation 3x20 seconds  Supine  hamstring stretch with strap 3x20 seconds  Supine and seated piriformis stretch 3x20 both positions  Sidelying clam 2x10 Supine TA activation with ball squeeze: 5" hold 2x10  Trigger Point Dry Needling Subsequent Treatment: Instructions provided previously at initial dry needling treatment.   Patient Verbal Consent Given: Yes Education Handout Provided: Previously Provided Muscles Treated: bil gluteals and low back  Electrical Stimulation Performed: No Treatment Response/Outcome: Utilized skilled palpation to identify bony landmarks and trigger points.  Able to illicit twitch response and muscle elongation.   Soft tissue mobilization to muscles needled following DN to further promote tissue elongation and decreased pain.       02/16/24:  NuStep: level 7x 6 minutes-PT present to discuss progress  Low trunk rotation 3x20 seconds  Supine hamstring stretch with strap 3x20 seconds  Supine piriformis stretch 3x20 both positions  Trigger Point Dry Needling  Initial Treatment: Pt instructed on Dry Needling rational, procedures, and possible side effects. Pt instructed to expect mild to moderate muscle soreness later in the day and/or into the next day.  Pt instructed in methods to reduce muscle soreness. Pt instructed to continue prescribed HEP. Because Dry Needling was performed over or adjacent to a lung field, pt was educated on S/S of pneumothorax and to seek immediate medical attention should they occur.  Patient was educated on signs and symptoms of infection and other risk factors and advised to seek medical attention should they occur.  Patient verbalized understanding of these instructions and education.   Patient Verbal Consent Given: Yes Education Handout Provided: Previously Provided Muscles Treated: bil lumbar multifidi, Rt gluteals  Electrical Stimulation Performed: No Treatment Response/Outcome: twitch and improved tissue mobility.  Elongation and release to muscles treated    02/15/2024 Initial Evaluation & HEP created                                                                                                                              Manual STM to bilateral lumbar paraspinals   PATIENT EDUCATION:  Education details: DN handout; examination findings; POC Person educated: Patient Education method: Explanation, Demonstration, and Handouts Education comprehension: verbalized understanding, returned demonstration, and needs further education  HOME EXERCISE PROGRAM: Access Code: [email or web address] URL: [email or web address] Date: 02/15/2024 Prepared by: Penelope Bowie  Exercises - Supine Lower Trunk Rotation  - 1 x daily - 7 x weekly - 1 sets - 10 reps - 5 hold - Supine Gluteus Stretch  - 1 x daily - 7 x weekly - 2 sets - 20-30 hold - Supine Hamstring Stretch with Strap  - 1 x daily - 7 x weekly - 2 sets - 20-30 hold - Supine Figure 4 Piriformis Stretch  - 1 x daily - 7 x weekly - 2 sets - 20-30 hold - Supine Piriformis Stretch  - 1 x daily - 7 x weekly - 2 sets - 20-30 hold - Standing Quadratus Lumborum Stretch with Doorway  - 1 x daily - 7 x weekly - 2 sets - 20-30 hold  ASSESSMENT:  CLINICAL IMPRESSION: Pt reports 60% overall reduction in LBP since the start of care.  She does report that she had 8/10 LBP after church on Sunday. She is independent and compliant with HEP for flexibility and did well with initial strength exercises today.  Good response to dry needling with improved tissue  mobility and twitch response in all areas treated.  PT monitored throughout session for pain and technique.  Patient is motivated and wants to get better. Patient will benefit from skilled PT to address the below impairments and improve overall function.   OBJECTIVE IMPAIRMENTS: decreased endurance, decreased ROM, decreased strength, increased muscle spasms, impaired flexibility, impaired tone, postural dysfunction, and pain.   ACTIVITY LIMITATIONS: standing,  sleeping, and walking  PARTICIPATION LIMITATIONS: cleaning, shopping, community activity, yard work, and [employer]  PERSONAL FACTORS: Age and 1-2 comorbidities: OA; HTN  are also affecting patient's functional outcome.   REHAB POTENTIAL: Good  CLINICAL DECISION MAKING: Stable/uncomplicated  EVALUATION COMPLEXITY: Low   GOALS: Goals reviewed with patient? Yes  SHORT TERM GOALS: Target date: 03/07/2024  Patient will be independent with initial HEP. Baseline:  Goal status: MET  2.  Patient will report > or = to 40% improvement in back pain since starting PT. Baseline: 60% reduction (02/23/24) Goal status: MET    LONG TERM GOALS: Target date: 03/28/2024  Patient will demonstrate independence in advanced HEP. Baseline:  Goal status: INITIAL  2.  Patient will report > or = to 80% improvement in back pain since starting PT. Baseline: 60% improvement (02/23/24) Goal status: in progress   3.  Patient will verbalize and demonstrate self-care strategies to manage pain including tissue mobility practices and change of position. Baseline:  Goal status: INITIAL   4.  Patient will be able to return to playing golf 3x week with < 2/10 back pain. Baseline:  Goal status: INITIAL  5.  Patient will be able to walk her normal working routine with her granddaughter with no back pain. Baseline: 15 minutes Goal status: INITIAL    PLAN:  PT FREQUENCY: 1-2x/week  PT DURATION: 6 weeks  PLANNED INTERVENTIONS: 97164- PT Re-evaluation, 97110-Therapeutic exercises, 97530- Therapeutic activity, [id number]- Neuromuscular re-education, 97535- Self Care, [id number]- Manual therapy, [id number]- Gait training, [phone]- Canalith repositioning, [id number]- Aquatic Therapy, [phone]- Electrical stimulation (unattended), [phone]- Electrical stimulation (manual), [id number]- Vasopneumatic device, [id number]- Ultrasound, [id number]- Traction (mechanical), [id number]- Ionotophoresis 4mg /ml Dexamethasone , Patient/Family education, Balance training,  Stair training, Taping, Dry Needling, Joint mobilization, Joint manipulation, Spinal manipulation, Spinal mobilization, Vestibular training, Cryotherapy, and Moist heat.  PLAN FOR NEXT SESSION: NuStep; DN suboccipitals, cervical multifidi, lumbar multifidi, gluteals; lumbar and hip mobility/ flexibility.  Add strength to HEP   Luella Sager, PT 02/23/24 10:59 AM  [facility] [address] [place], [place] [address] Phone # [phone] Fax [phone]

## 2024-02-25 ENCOUNTER — Ambulatory Visit

## 2024-02-25 DIAGNOSIS — M6281 Muscle weakness (generalized): Secondary | ICD-10-CM

## 2024-02-25 DIAGNOSIS — R252 Cramp and spasm: Secondary | ICD-10-CM

## 2024-02-25 DIAGNOSIS — M542 Cervicalgia: Secondary | ICD-10-CM

## 2024-02-25 DIAGNOSIS — M5459 Other low back pain: Secondary | ICD-10-CM | POA: Diagnosis not present

## 2024-02-25 NOTE — Therapy (Signed)
 OUTPATIENT PHYSICAL THERAPY TREATMENT   Patient Name: Llesenia Troth MRN: [id number] DOB:August 25, 1951, 73 y.o., female Today's Date: 02/25/2024  END OF SESSION:  PT End of Session - 02/25/24 1055     Visit Number 4    Date for PT Re-Evaluation 03/28/24    Authorization Type [employer] MCR [id number]    Progress Note Due on Visit 10    PT Start Time 1016    PT Stop Time 1056    PT Time Calculation (min) 40 min    Activity Tolerance Patient tolerated treatment well    Behavior During Therapy WFL for tasks assessed/performed                Past Medical History:  Diagnosis Date   Acute meniscal tear of left knee    treated by Dr Jeanmarie Millet date pending   Allergy Unknow   Sulfer   Arthritis    Asthma    as a child   Baker's cyst of knee    left knee-treated by Dr Marland Silvas   Cataract 2024   Removed 2024   Complication of anesthesia    Patient states she woke during an endoscopy but no problems since then   GAD (generalized anxiety disorder) 03/12/2015   -with rare panic disorder related to caring for grandaughter    GERD (gastroesophageal reflux disease)    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   Hypertension    PONV (postoperative nausea and vomiting)    Seasonal allergies    Spider veins 11/23/2013   Past Surgical History:  Procedure Laterality Date   BREAST BIOPSY     left   COLONOSCOPY  2005   Dr Nelson Bandy   DENTAL SURGERY     Cyst on right bottom tooth   SUPRACLAVICAL NODE BIOPSY Left 09/01/2022   Procedure: EXCISION LIPOMA LEFT SUPRACLAVICAL FOSSA;  Surgeon: Zelphia Higashi, MD;  Location: [facility] OR;  Service: Thoracic;  Laterality: Left;   TOE SURGERY     TONSILLECTOMY AND ADENOIDECTOMY     UPPER GASTROINTESTINAL ENDOSCOPY     Patient Active Problem List   Diagnosis Date Noted   Other fatigue 10/29/2023   Right hip pain 10/29/2023   Multiple thyroid  nodules 10/07/2023   Statin intolerance 08/28/2023   Aortic dilatation (HCC) 08/28/2023   DDD (degenerative disc  disease), cervical 07/12/2023   Lipoma of skin and subcutaneous tissue of trunk 11/14/2021   Essential hypertension 09/28/2019   Hiatal hernia 08/15/2019   Osteoarthritis of knee 08/15/2019   Hyperlipidemia 05/17/2018   Seasonal allergies 09/17/2015   GAD (generalized anxiety disorder) 03/12/2015   Hx of herpes labialis 03/12/2015   GERD 07/14/2007    PCP: Mandy Second, PA  REFERRING PROVIDER: Mandy Second, PA  REFERRING DIAG: M54.50 (ICD-10-CM) - Acute midline low back pain without sciatica  Rationale for Evaluation and Treatment: Rehabilitation  THERAPY DIAG:  Other low back pain  Cervicalgia  Cramp and spasm  Muscle weakness (generalized)  ONSET DATE: 02/11/2024  SUBJECTIVE:  SUBJECTIVE STATEMENT: The dry needling helped me, I had less pain after.  60% better overall.  I had a lot of pain on Sunday after church.  I leave for my trip on Monday.   From eval: Patient presents with increased back pain that began a week ago (02/11/2024). She did a lot of digging that day at her daughter's house in the garden. The next day she played couples golf which is something she normally does 3x a week. She went to the chiropractor for a maintenance visit and he was able to get her aligned. She has been to the chiropractor for two visits since her back started hurting but it did not solve the problem. Patient denies numbness and tingling down her leg. It feels like a gripping sensation in her low back. She has a trip planned May 12th to [place] and she wants to get her back right so she can walk. Since her back started hurting her neck has started hurting too. She has been to [facility] previously to address her neck pain. Her standing tolerance is limited to < 5 mins and her walking tolerance is limited to 15  mins.  PERTINENT HISTORY:  OA; HTN; hx cervical pain;   PAIN: 02/23/24 Are you having pain? Yes: NPRS scale: 0-8/10 Pain location: across SIJ  Pain description: tight; achy Aggravating factors: standing;   Relieving factors: bending; medication  PRECAUTIONS: None  RED FLAGS: None   WEIGHT BEARING RESTRICTIONS: No  FALLS:  Has patient fallen in last 6 months? No  LIVING ENVIRONMENT: Lives with: lives with their spouse Lives in: House/apartment Stairs: 4 steps to get into house; 14 internal but she normally doesn't go up there   OCCUPATION: Retired  PLOF: Independent, Independent with basic ADLs, Independent with gait, and Independent with transfers  PATIENT GOALS: To get rid of the tightness so I can stand up and play golf  NEXT MD VISIT: PRN  OBJECTIVE:  Note: Objective measures were completed at Evaluation unless otherwise noted.  DIAGNOSTIC FINDINGS:  None  PATIENT SURVEYS:  Modified Oswestry 14/50 28%   COGNITION: Overall cognitive status: Within functional limits for tasks assessed     SENSATION: WFL  MUSCLE LENGTH: Hamstrings: decreased bilateral Lt > Rt   POSTURE: forward head  PALPATION: Tenderness with palpation of SIJ; PA to SIJ relieved pain Increased muscle spasms of bilateral lumbar paraspinals & suboccipitals Tenderness with glute palpation    LUMBAR ROM: *tight  AROM eval  Flexion Bends to toes  Extension full  Right lateral flexion Bends to above knee joint*  Left lateral flexion Bends to above knee joint*  Right rotation full  Left rotation 60% limited   (Blank rows = not tested)  LOWER EXTREMITY ROM:   WFL bilateral    LOWER EXTREMITY MMT:  Grossly 4+/5     FUNCTIONAL TESTS:  5 times sit to stand: 11.67 sec no UE support Timed up and go (TUG): 9.72sec  GAIT: Comments: Unremarkable   TREATMENT DATE:    02/16/24:  NuStep: level 7x 6 minutes-PT present to discuss progress  Low trunk rotation 3x20 seconds  Supine  hamstring stretch with strap 3x20 seconds  Supine and seated piriformis stretch 3x20 both positions  Sidelying clam 2x10 Supine TA activation with ball squeeze: 5" hold 2x10  Trigger Point Dry Needling Subsequent Treatment: Instructions provided previously at initial dry needling treatment.   Patient Verbal Consent Given: Yes Education Handout Provided: Previously Provided Muscles Treated: bil gluteals and low back  Electrical  Stimulation Performed: No Treatment Response/Outcome: Utilized skilled palpation to identify bony landmarks and trigger points.  Able to illicit twitch response and muscle elongation.   Soft tissue mobilization to muscles needled following DN to further promote tissue elongation and decreased pain.       02/16/24:  NuStep: level 7x 6 minutes-PT present to discuss progress  Low trunk rotation 3x20 seconds  Supine hamstring stretch with strap 3x20 seconds  Supine piriformis stretch 3x20 both positions  Trigger Point Dry Needling  Initial Treatment: Pt instructed on Dry Needling rational, procedures, and possible side effects. Pt instructed to expect mild to moderate muscle soreness later in the day and/or into the next day.  Pt instructed in methods to reduce muscle soreness. Pt instructed to continue prescribed HEP. Because Dry Needling was performed over or adjacent to a lung field, pt was educated on S/S of pneumothorax and to seek immediate medical attention should they occur.  Patient was educated on signs and symptoms of infection and other risk factors and advised to seek medical attention should they occur.  Patient verbalized understanding of these instructions and education.   Patient Verbal Consent Given: Yes Education Handout Provided: Previously Provided Muscles Treated: bil lumbar multifidi, Rt gluteals  Electrical Stimulation Performed: No Treatment Response/Outcome: twitch and improved tissue mobility.  Elongation and release to muscles treated    02/15/2024 Initial Evaluation & HEP created                                                                                                                              Manual STM to bilateral lumbar paraspinals   PATIENT EDUCATION:  Education details: DN handout; examination findings; POC Person educated: Patient Education method: Explanation, Demonstration, and Handouts Education comprehension: verbalized understanding, returned demonstration, and needs further education  HOME EXERCISE PROGRAM: Access Code: [email or web address] URL: [email or web address] Date: 02/15/2024 Prepared by: Penelope Bowie  Exercises - Supine Lower Trunk Rotation  - 1 x daily - 7 x weekly - 1 sets - 10 reps - 5 hold - Supine Gluteus Stretch  - 1 x daily - 7 x weekly - 2 sets - 20-30 hold - Supine Hamstring Stretch with Strap  - 1 x daily - 7 x weekly - 2 sets - 20-30 hold - Supine Figure 4 Piriformis Stretch  - 1 x daily - 7 x weekly - 2 sets - 20-30 hold - Supine Piriformis Stretch  - 1 x daily - 7 x weekly - 2 sets - 20-30 hold - Standing Quadratus Lumborum Stretch with Doorway  - 1 x daily - 7 x weekly - 2 sets - 20-30 hold  ASSESSMENT:  CLINICAL IMPRESSION: Pt reports 60% overall reduction in LBP since the start of care.  She does report that she had 8/10 LBP after church on Sunday. She is independent and compliant with HEP for flexibility and did well with initial strength exercises today.  Good response to dry needling with improved tissue mobility  and twitch response in all areas treated.  PT monitored throughout session for pain and technique.  Patient is motivated and wants to get better. Patient will benefit from skilled PT to address the below impairments and improve overall function.   OBJECTIVE IMPAIRMENTS: decreased endurance, decreased ROM, decreased strength, increased muscle spasms, impaired flexibility, impaired tone, postural dysfunction, and pain.   ACTIVITY LIMITATIONS: standing,  sleeping, and walking  PARTICIPATION LIMITATIONS: cleaning, shopping, community activity, yard work, and [employer]  PERSONAL FACTORS: Age and 1-2 comorbidities: OA; HTN are also affecting patient's functional outcome.   REHAB POTENTIAL: Good  CLINICAL DECISION MAKING: Stable/uncomplicated  EVALUATION COMPLEXITY: Low   GOALS: Goals reviewed with patient? Yes  SHORT TERM GOALS: Target date: 03/07/2024  Patient will be independent with initial HEP. Baseline:  Goal status: MET  2.  Patient will report > or = to 40% improvement in back pain since starting PT. Baseline: 60% reduction (02/23/24) Goal status: MET    LONG TERM GOALS: Target date: 03/28/2024  Patient will demonstrate independence in advanced HEP. Baseline:  Goal status: INITIAL  2.  Patient will report > or = to 80% improvement in back pain since starting PT. Baseline: 60% improvement (02/23/24) Goal status: in progress   3.  Patient will verbalize and demonstrate self-care strategies to manage pain including tissue mobility practices and change of position. Baseline:  Goal status: INITIAL   4.  Patient will be able to return to playing golf 3x week with < 2/10 back pain. Baseline:  Goal status: INITIAL  5.  Patient will be able to walk her normal working routine with her granddaughter with no back pain. Baseline: 15 minutes Goal status: INITIAL    PLAN:  PT FREQUENCY: 1-2x/week  PT DURATION: 6 weeks  PLANNED INTERVENTIONS: 97164- PT Re-evaluation, 97110-Therapeutic exercises, 97530- Therapeutic activity, [id number]- Neuromuscular re-education, 97535- Self Care, 16109- Manual therapy, [id number]- Gait training, [phone]- Canalith repositioning, [id number]- Aquatic Therapy, [phone]- Electrical stimulation (unattended), [phone]- Electrical stimulation (manual), [id number]- Vasopneumatic device, [id number]- Ultrasound, [id number]- Traction (mechanical), [id number]- Ionotophoresis 4mg /ml Dexamethasone , Patient/Family education, Balance training,  Stair training, Taping, Dry Needling, Joint mobilization, Joint manipulation, Spinal manipulation, Spinal mobilization, Vestibular training, Cryotherapy, and Moist heat.  PLAN FOR NEXT SESSION: NuStep; DN suboccipitals, cervical multifidi, lumbar multifidi, gluteals; lumbar and hip mobility/ flexibility.  Add strength to HEP   Luella Sager, PT 02/25/24 10:58 AM  [facility] [address] [place], [place] [address] Phone # [phone] Fax [phone]  OUTPATIENT PHYSICAL THERAPY TREATMENT   Patient Name: Jameya Klingberg MRN: [id number] DOB:05-31-1951, 73 y.o., female Today's Date: 02/25/2024  END OF SESSION:  PT End of Session - 02/25/24 1055     Visit Number 4    Date for PT Re-Evaluation 03/28/24    Authorization Type A[employer] MCR [id number]    Progress Note Due on Visit 10    PT Start Time 1016    PT Stop Time 1056    PT Time Calculation (min) 40 min    Activity Tolerance Patient tolerated treatment well    Behavior During Therapy WFL for tasks assessed/performed                Past Medical History:  Diagnosis Date   Acute meniscal tear of left knee    treated by Dr Jeanmarie Millet date pending   Allergy Unknow   Sulfer   Arthritis    Asthma    as a child   Baker's cyst of knee    left knee-treated by Dr Marland Silvas   Cataract 2024   Removed 2024   Complication of anesthesia    Patient states she woke during an endoscopy but no problems since then   GAD (generalized anxiety disorder) 03/12/2015   -with rare panic disorder related to caring for grandaughter    GERD (gastroesophageal reflux disease)    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   Hypertension    PONV (postoperative nausea and vomiting)    Seasonal allergies    Spider veins 11/23/2013   Past Surgical  History:  Procedure Laterality Date   BREAST BIOPSY     left   COLONOSCOPY  2005   Dr Nelson Bandy   DENTAL SURGERY     Cyst on right bottom tooth   SUPRACLAVICAL NODE BIOPSY Left 09/01/2022   Procedure: EXCISION LIPOMA LEFT SUPRACLAVICAL FOSSA;  Surgeon: Zelphia Higashi, MD;  Location: [facility] OR;  Service: Thoracic;  Laterality: Left;   TOE SURGERY     TONSILLECTOMY AND ADENOIDECTOMY     UPPER GASTROINTESTINAL ENDOSCOPY     Patient Active Problem List   Diagnosis Date Noted   Other fatigue 10/29/2023   Right hip pain 10/29/2023   Multiple thyroid  nodules 10/07/2023   Statin intolerance 08/28/2023   Aortic dilatation (HCC) 08/28/2023   DDD (degenerative disc disease), cervical 07/12/2023   Lipoma of skin and subcutaneous tissue of trunk 11/14/2021   Essential hypertension 09/28/2019   Hiatal hernia 08/15/2019   Osteoarthritis of knee 08/15/2019   Hyperlipidemia 05/17/2018   Seasonal allergies 09/17/2015   GAD (generalized anxiety disorder) 03/12/2015   Hx of herpes labialis 03/12/2015   GERD 07/14/2007    PCP: Mandy Second, PA  REFERRING PROVIDER: Mandy Second, PA  REFERRING DIAG: M54.50 (ICD-10-CM) - Acute midline low back pain without sciatica  Rationale for Evaluation and Treatment: Rehabilitation  THERAPY DIAG:  Other low back pain  Cervicalgia  Cramp and spasm  Muscle weakness (generalized)  ONSET DATE: 02/11/2024  SUBJECTIVE:  SUBJECTIVE STATEMENT: The needling last session really helped. I feel 90% better.  I'm stiff but not painful.  I leave for [place] on Monday.    From eval: Patient presents with increased back pain that began a week ago (02/11/2024). She did a lot of digging that day at her daughter's house in the garden. The next day she played couples golf  which is something she normally does 3x a week. She went to the chiropractor for a maintenance visit and he was able to get her aligned. She has been to the chiropractor for two visits since her back started hurting but it did not solve the problem. Patient denies numbness and tingling down her leg. It feels like a gripping sensation in her low back. She has a trip planned May 12th to [place] and she wants to get her back right so she can walk. Since her back started hurting her neck has started hurting too. She has been to [facility] previously to address her neck pain. Her standing tolerance is limited to < 5 mins and her walking tolerance is limited to 15 mins.  PERTINENT HISTORY:  OA; HTN; hx cervical pain;   PAIN: 02/25/24 Are you having pain? Yes: NPRS scale: 2/10 Pain location: across SIJ  Pain description: tight; achy Aggravating factors: standing;   Relieving factors: bending; medication  PRECAUTIONS: None  RED FLAGS: None   WEIGHT BEARING RESTRICTIONS: No  FALLS:  Has patient fallen in last 6 months? No  LIVING ENVIRONMENT: Lives with: lives with their spouse Lives in: House/apartment Stairs: 4 steps to get into house; 14 internal but she normally doesn't go up there   OCCUPATION: Retired  PLOF: Independent, Independent with basic ADLs, Independent with gait, and Independent with transfers  PATIENT GOALS: To get rid of the tightness so I can stand up and play golf  NEXT MD VISIT: PRN  OBJECTIVE:  Note: Objective measures were completed at Evaluation unless otherwise noted.  DIAGNOSTIC FINDINGS:  None  PATIENT SURVEYS:  Modified Oswestry 14/50 28%   COGNITION: Overall cognitive status: Within functional limits for tasks assessed     SENSATION: WFL  MUSCLE LENGTH: Hamstrings: decreased bilateral Lt > Rt   POSTURE: forward head  PALPATION: Tenderness with palpation of SIJ; PA to SIJ relieved pain Increased muscle spasms of bilateral lumbar paraspinals &  suboccipitals Tenderness with glute palpation    LUMBAR ROM: *tight  AROM eval  Flexion Bends to toes  Extension full  Right lateral flexion Bends to above knee joint*  Left lateral flexion Bends to above knee joint*  Right rotation full  Left rotation 60% limited   (Blank rows = not tested)  LOWER EXTREMITY ROM:   WFL bilateral    LOWER EXTREMITY MMT:  Grossly 4+/5     FUNCTIONAL TESTS:  5 times sit to stand: 11.67 sec no UE support Timed up and go (TUG): 9.72sec  GAIT: Comments: Unremarkable   TREATMENT DATE:    02/25/24:  NuStep: level 7x 6 minutes-PT present to discuss progress  Low trunk rotation 3x20 seconds  Supine hamstring stretch with strap 3x20 seconds  Supine and seated piriformis stretch 3x20 both positions  Sidelying clam 2x10 Supine TA activation with ball squeeze: 5" hold 2x10  Trigger Point Dry Needling Subsequent Treatment: Instructions provided previously at initial dry needling treatment.   Patient Verbal Consent Given: Yes Education Handout Provided: Previously Provided Muscles Treated: bil gluteals and low back  Electrical Stimulation Performed: No Treatment Response/Outcome: Utilized skilled palpation to  identify bony landmarks and trigger points.  Able to illicit twitch response and muscle elongation.   Soft tissue mobilization to muscles needled following DN to further promote tissue elongation and decreased pain.   02/23/24:  NuStep: level 7x 6 minutes-PT present to discuss progress  Low trunk rotation 3x20 seconds  Supine hamstring stretch with strap 3x20 seconds  Supine and seated piriformis stretch 3x20 both positions  Sidelying clam 2x10 Supine TA activation with ball squeeze: 5" hold 2x10  Trigger Point Dry Needling Subsequent Treatment: Instructions provided previously at initial dry needling treatment.   Patient Verbal Consent Given: Yes Education Handout Provided: Previously Provided Muscles Treated: bil gluteals and low  back  Electrical Stimulation Performed: No Treatment Response/Outcome: Utilized skilled palpation to identify bony landmarks and trigger points.  Able to illicit twitch response and muscle elongation.   Soft tissue mobilization to muscles needled following DN to further promote tissue elongation and decreased pain.       02/16/24:  NuStep: level 7x 6 minutes-PT present to discuss progress  Low trunk rotation 3x20 seconds  Supine hamstring stretch with strap 3x20 seconds  Supine piriformis stretch 3x20 both positions  Trigger Point Dry Needling  Initial Treatment: Pt instructed on Dry Needling rational, procedures, and possible side effects. Pt instructed to expect mild to moderate muscle soreness later in the day and/or into the next day.  Pt instructed in methods to reduce muscle soreness. Pt instructed to continue prescribed HEP. Because Dry Needling was performed over or adjacent to a lung field, pt was educated on S/S of pneumothorax and to seek immediate medical attention should they occur.  Patient was educated on signs and symptoms of infection and other risk factors and advised to seek medical attention should they occur.  Patient verbalized understanding of these instructions and education.   Patient Verbal Consent Given: Yes Education Handout Provided: Previously Provided Muscles Treated: bil lumbar multifidi, Rt gluteals  Electrical Stimulation Performed: No Treatment Response/Outcome: twitch and improved tissue mobility.  Elongation and release to muscles treated    PATIENT EDUCATION:  Education details: DN handout; examination findings; POC Person educated: Patient Education method: Explanation, Demonstration, and Handouts Education comprehension: verbalized understanding, returned demonstration, and needs further education  HOME EXERCISE PROGRAM: Access Code: [email or web address] URL: [email or web address] Date: 02/15/2024 Prepared by: Penelope Bowie  Exercises -  Supine Lower Trunk Rotation  - 1 x daily - 7 x weekly - 1 sets - 10 reps - 5 hold - Supine Gluteus Stretch  - 1 x daily - 7 x weekly - 2 sets - 20-30 hold - Supine Hamstring Stretch with Strap  - 1 x daily - 7 x weekly - 2 sets - 20-30 hold - Supine Figure 4 Piriformis Stretch  - 1 x daily - 7 x weekly - 2 sets - 20-30 hold - Supine Piriformis Stretch  - 1 x daily - 7 x weekly - 2 sets - 20-30 hold - Standing Quadratus Lumborum Stretch with Doorway  - 1 x daily - 7 x weekly - 2 sets - 20-30 hold  ASSESSMENT:  CLINICAL IMPRESSION: Pt reports 90% overall reduction in LBP since the start of care.  She denies pain today, just stiffness. She is independent and compliant with HEP for flexibility and PT added strength to HEP.  Good response to dry needling with improved tissue mobility and twitch response in all areas treated.  PT monitored throughout session for pain and technique.  Pt will be in [place] until 03/08/24. Patient will benefit  from skilled PT to address the below impairments and improve overall function.   OBJECTIVE IMPAIRMENTS: decreased endurance, decreased ROM, decreased strength, increased muscle spasms, impaired flexibility, impaired tone, postural dysfunction, and pain.   ACTIVITY LIMITATIONS: standing, sleeping, and walking  PARTICIPATION LIMITATIONS: cleaning, shopping, community activity, yard work, and Naval [employer]  PERSONAL FACTORS: Age and 1-2 comorbidities: OA; HTN are also affecting patient's functional outcome.   REHAB POTENTIAL: Good  CLINICAL DECISION MAKING: Stable/uncomplicated  EVALUATION COMPLEXITY: Low   GOALS: Goals reviewed with patient? Yes  SHORT TERM GOALS: Target date: 03/07/2024  Patient will be independent with initial HEP. Baseline:  Goal status: MET  2.  Patient will report > or = to 40% improvement in back pain since starting PT. Baseline: 60% reduction (02/23/24) Goal status: MET    LONG TERM GOALS: Target date: 03/28/2024  Patient  will demonstrate independence in advanced HEP. Baseline:  Goal status: INITIAL  2.  Patient will report > or = to 80% improvement in back pain since starting PT. Baseline: 60% improvement (02/23/24) Goal status: in progress   3.  Patient will verbalize and demonstrate self-care strategies to manage pain including tissue mobility practices and change of position. Baseline:  Goal status: INITIAL   4.  Patient will be able to return to playing golf 3x week with < 2/10 back pain. Baseline:  Goal status: INITIAL  5.  Patient will be able to walk her normal working routine with her granddaughter with no back pain. Baseline: 15 minutes Goal status: INITIAL    PLAN:  PT FREQUENCY: 1-2x/week  PT DURATION: 6 weeks  PLANNED INTERVENTIONS: 97164- PT Re-evaluation, 97110-Therapeutic exercises, 97530- Therapeutic activity, [id number]- Neuromuscular re-education, 97535- Self Care, 16109- Manual therapy, [id number]- Gait training, [phone]- Canalith repositioning, [id number]- Aquatic Therapy, [phone]- Electrical stimulation (unattended), [phone]- Electrical stimulation (manual), [id number]- Vasopneumatic device, [id number]- Ultrasound, [id number]- Traction (mechanical), [id number]- Ionotophoresis 4mg /ml Dexamethasone , Patient/Family education, Balance training, Stair training, Taping, Dry Needling, Joint mobilization, Joint manipulation, Spinal manipulation, Spinal mobilization, Vestibular training, Cryotherapy, and Moist heat.  PLAN FOR NEXT SESSION: NuStep; DN suboccipitals, cervical multifidi, lumbar multifidi, gluteals; lumbar and hip mobility/ flexibility.  Will be in [place] until 03/08/24.   Luella Sager, PT 02/25/24 10:58 AM  [facility] [address], [address] [place], [place] [address] Phone # [phone] Fax [phone]

## 2024-02-28 DIAGNOSIS — S39012A Strain of muscle, fascia and tendon of lower back, initial encounter: Secondary | ICD-10-CM | POA: Diagnosis not present

## 2024-02-29 ENCOUNTER — Encounter: Payer: Medicare HMO | Admitting: Urgent Care

## 2024-03-10 ENCOUNTER — Ambulatory Visit

## 2024-03-10 ENCOUNTER — Ambulatory Visit (INDEPENDENT_AMBULATORY_CARE_PROVIDER_SITE_OTHER): Admitting: Urgent Care

## 2024-03-10 ENCOUNTER — Ambulatory Visit: Payer: Self-pay

## 2024-03-10 ENCOUNTER — Encounter: Payer: Self-pay | Admitting: Urgent Care

## 2024-03-10 VITALS — BP 155/79 | HR 62 | Temp 98.1°F | Wt 160.8 lb

## 2024-03-10 DIAGNOSIS — R051 Acute cough: Secondary | ICD-10-CM

## 2024-03-10 DIAGNOSIS — U071 COVID-19: Secondary | ICD-10-CM | POA: Diagnosis not present

## 2024-03-10 LAB — POC COVID19 BINAXNOW: SARS Coronavirus 2 Ag: POSITIVE — AB

## 2024-03-10 NOTE — Progress Notes (Signed)
 Established Patient Office Visit  Subjective:  Patient ID: [name], female    DOB: 1951/07/27  Age: 73 y.o. MRN: [id number]  Chief Complaint  Patient presents with   Cough    Pt started with a sore throat a few days ago and now has a bad cough. She has been bringing up greenish phlegm.      HPI  Discussed the use of AI scribe software for clinical note transcription with the patient, who gave verbal consent to proceed.  History of Present Illness   [name] is a 73 year old female who presents with a persistent cough and thick green mucus after recent travel to [place].  Her symptoms began on Friday, Mar 04, 2024, with a severe burning sore throat. She was prescribed a Z-Pak, which she started immediately, taking two pills that night. She experienced chills and night sweats, requiring her to change her clothes due to them being soaking wet. After two days of treatment, her sore throat resolved, but she developed a persistent cough with thick green mucus. She admits several other members of her party overseas were also sick. She denies CP or SOB.  She continues to experience a cough with green mucus production and has managed a fever with Tylenol . No current headaches, but she mentions having had a fever. She has a history of asthma as a child. She recalls hearing wheezing in her lungs and mentions a past removal of a nodule and a blockage in her aorta that is being monitored.  Her current medications include the completed Z-Pak and Tylenol  for fever management. She is concerned about her husband's health as he has developed symptoms after returning home.      Patient Active Problem List   Diagnosis Date Noted   Other fatigue 10/29/2023   Right hip pain 10/29/2023   Multiple thyroid  nodules 10/07/2023   Statin intolerance 08/28/2023   Aortic dilatation (HCC) 08/28/2023   DDD (degenerative disc disease), cervical 07/12/2023   Lipoma of skin and subcutaneous tissue of trunk  11/14/2021   Essential hypertension 09/28/2019   Hiatal hernia 08/15/2019   Osteoarthritis of knee 08/15/2019   Hyperlipidemia 05/17/2018   Seasonal allergies 09/17/2015   GAD (generalized anxiety disorder) 03/12/2015   Hx of herpes labialis 03/12/2015   GERD 07/14/2007   Past Medical History:  Diagnosis Date   Acute meniscal tear of left knee    treated by Dr Jeanmarie Millet date pending   Allergy Unknow   Sulfer   Arthritis    Asthma    as a child   Baker's cyst of knee    left knee-treated by Dr Marland Silvas   Cataract 2024   Removed 2024   Complication of anesthesia    Patient states she woke during an endoscopy but no problems since then   GAD (generalized anxiety disorder) 03/12/2015   -with rare panic disorder related to caring for grandaughter    GERD (gastroesophageal reflux disease)    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   Hypertension    PONV (postoperative nausea and vomiting)    Seasonal allergies    Spider veins 11/23/2013   Past Surgical History:  Procedure Laterality Date   BREAST BIOPSY     left   COLONOSCOPY  2005   Dr Nelson Bandy   DENTAL SURGERY     Cyst on right bottom tooth   SUPRACLAVICAL NODE BIOPSY Left 09/01/2022   Procedure: EXCISION LIPOMA LEFT SUPRACLAVICAL FOSSA;  Surgeon: Zelphia Higashi, MD;  Location: [facility] OR;  Service: Thoracic;  Laterality: Left;   TOE SURGERY     TONSILLECTOMY AND ADENOIDECTOMY     UPPER GASTROINTESTINAL ENDOSCOPY     Social History   Tobacco Use   Smoking status: Never   Smokeless tobacco: Never  Vaping Use   Vaping status: Never Used  Substance Use Topics   Alcohol use: Yes    Alcohol/week: 8.0 standard drinks of alcohol    Types: 8 Glasses of wine per week    Comment: Social   Drug use: No      ROS: as noted in HPI  Objective:     BP (!) 155/79   Pulse 62   Temp 98.1 F (36.7 C) (Oral)   Wt 160 lb 12.8 oz (72.9 kg)   LMP 10/20/2000   SpO2 97%   BMI 26.76 kg/m  BP Readings from Last 3 Encounters:   03/10/24 (!) 155/79  02/15/24 (!) 160/70  12/30/23 (!) 140/78   Wt Readings from Last 3 Encounters:  03/10/24 160 lb 12.8 oz (72.9 kg)  02/15/24 162 lb (73.5 kg)  12/30/23 160 lb 6.4 oz (72.8 kg)      Physical Exam Vitals and nursing note reviewed.  Constitutional:      General: She is not in acute distress.    Appearance: Normal appearance. She is normal weight. She is not ill-appearing, toxic-appearing or diaphoretic.  HENT:     Head: Normocephalic and atraumatic.     Right Ear: Tympanic membrane, ear canal and external ear normal. There is no impacted cerumen.     Left Ear: Tympanic membrane, ear canal and external ear normal. There is no impacted cerumen.     Nose: Nose normal. No congestion or rhinorrhea.     Mouth/Throat:     Mouth: Mucous membranes are moist.     Pharynx: Oropharynx is clear. Posterior oropharyngeal erythema (soft palate only) present. No oropharyngeal exudate.  Eyes:     General: No scleral icterus.       Right eye: No discharge.        Left eye: No discharge.     Pupils: Pupils are equal, round, and reactive to light.  Cardiovascular:     Rate and Rhythm: Normal rate and regular rhythm.  Pulmonary:     Effort: Pulmonary effort is normal. No respiratory distress.     Breath sounds: Normal breath sounds. No stridor. No wheezing, rhonchi or rales.  Musculoskeletal:     Cervical back: Normal range of motion and neck supple. No rigidity or tenderness.  Lymphadenopathy:     Cervical: No cervical adenopathy.  Skin:    General: Skin is warm and dry.     Coloration: Skin is not jaundiced.     Findings: No bruising, erythema or rash.  Neurological:     Mental Status: She is alert.      Results for orders placed or performed in visit on 03/10/24  POC COVID-19 BinaxNow  Result Value Ref Range   SARS Coronavirus 2 Ag Positive (A) Negative      The 10-year ASCVD risk score (Arnett DK, et al., 2019) is: 27.3%  Assessment & Plan:  Acute cough -      POC COVID-19 BinaxNow  COVID-19  Assessment and Plan    COVID-19 infection Confirmed COVID-19 infection with persistent cough and mucus production. Outside antiviral treatment window. No pneumonia symptoms. - Recommend quercetin with zinc. - Advise rest and hydration. - Use Mucinex for mucus. - Advise mask-wearing and distancing. - Provide  COVID-19 information sheet.         No follow-ups on file.   Mandy Second, PA

## 2024-03-10 NOTE — Telephone Encounter (Signed)
  Chief Complaint: cough  Symptoms: cough Frequency: x 6 days Pertinent Negatives: Patient denies sob, fever Disposition: [] ED /[] Urgent Care (no appt availability in office) / [x] Appointment(In office/virtual)/ []  [place]/ [] Home Care/ [] Refused Recommended Disposition /[]  Mobile Bus/ []  Follow-up with PCP  Additional Notes: pt states that she recently got back from [place] on Tuesday and she has a sore throat so she took a Z-pak that was given to her to take with her for a sore throat and now she has a cough.  States that she was producing green sputum but that has resolved but the cough has not. States started a week ago. Pt states that she also has no taste.      Copied From CRM [id number]. Reason for Triage: Patient finished antibiotics and z pack but is now having bad cough in chest and would like medication sent to pharmacy on file     Reason for Disposition  [1] Continuous (nonstop) coughing interferes with work or school AND [2] no improvement using cough treatment per Care Advice  Answer Assessment - Initial Assessment Questions 1. ONSET: "When did the cough begin?"      6 days  2. SEVERITY: "How bad is the cough today?"      mod 3. SPUTUM: "Describe the color of your sputum" (none, dry cough; clear, white, yellow, green)     no 4. HEMOPTYSIS: "Are you coughing up any blood?" If so ask: "How much?" (flecks, streaks, tablespoons, etc.)     no 5. DIFFICULTY BREATHING: "Are you having difficulty breathing?" If Yes, ask: "How bad is it?" (e.g., mild, moderate, severe)    - MILD: No SOB at rest, mild SOB with walking, speaks normally in sentences, can lie down, no retractions, pulse < 100.    - MODERATE: SOB at rest, SOB with minimal exertion and prefers to sit, cannot lie down flat, speaks in phrases, mild retractions, audible wheezing, pulse 100-120.    - SEVERE: Very SOB at rest, speaks in single words, struggling to breathe, sitting hunched forward,  retractions, pulse > 120      no 6. FEVER: "Do you have a fever?" If Yes, ask: "What is your temperature, how was it measured, and when did it start?"     chills 10. OTHER SYMPTOMS: "Do you have any other symptoms?" (e.g., runny nose, wheezing, chest pain)       Sore throat 12. TRAVEL: "Have you traveled out of the country in the last month?" (e.g., travel history, exposures)       Traveled to [place]  Protocols used: Cough - Acute Productive-A-AH

## 2024-03-10 NOTE — Patient Instructions (Signed)
 You are positive for covid.  Please read the attached information regarding covid. You are past the anti-viral treatment window.  Please rest and drink plenty of water. You can purchased over the counter oscillococcinum - this helps with body aches. Over the counter Quercetin with zinc helps boost your natural immune system to fight off the virus.   Take 600mg  plain mucinex (guaifenesin) twice daily to help break up and get the mucous out.

## 2024-03-16 ENCOUNTER — Ambulatory Visit

## 2024-03-17 ENCOUNTER — Encounter: Payer: Self-pay | Admitting: Urgent Care

## 2024-03-17 DIAGNOSIS — J0100 Acute maxillary sinusitis, unspecified: Secondary | ICD-10-CM

## 2024-03-17 MED ORDER — AMOXICILLIN-POT CLAVULANATE 875-125 MG PO TABS: 1.0000 | ORAL_TABLET | Freq: Two times a day (BID) | ORAL | 0 refills | Status: AC

## 2024-03-21 ENCOUNTER — Encounter: Payer: Self-pay | Admitting: Urgent Care

## 2024-03-22 ENCOUNTER — Encounter

## 2024-03-28 ENCOUNTER — Ambulatory Visit (INDEPENDENT_AMBULATORY_CARE_PROVIDER_SITE_OTHER): Admitting: Urgent Care

## 2024-03-28 ENCOUNTER — Encounter: Payer: Self-pay | Admitting: Urgent Care

## 2024-03-28 VITALS — BP 136/84 | HR 61 | Temp 98.4°F | Wt 160.1 lb

## 2024-03-28 DIAGNOSIS — R051 Acute cough: Secondary | ICD-10-CM

## 2024-03-28 DIAGNOSIS — U099 Post covid-19 condition, unspecified: Secondary | ICD-10-CM | POA: Diagnosis not present

## 2024-03-28 MED ORDER — DEXAMETHASONE 6 MG PO TABS: 6.0000 mg | ORAL_TABLET | Freq: Every day | ORAL | 0 refills | Status: AC

## 2024-03-28 NOTE — Patient Instructions (Signed)
 Continue mucinex 600mg  twice daily with plenty of water. Continue your nasal lavage and antihistamine spray.  Continue cough drops as needed. Take dexamethasone  one tab daily with breakfast until gone. Drink plenty of water on the medication and avoid excessive sun exposure.

## 2024-03-28 NOTE — Progress Notes (Signed)
 Established Patient Office Visit  Subjective:  Patient ID: [name], female    DOB: [birth year]  Age: 73 y.o. MRN: [id number]  Chief Complaint  Patient presents with   Cough    Pt has been having a cough since she was diagnosed with Covid  few weeks ago. She has been coughing up some green mucous. She has also been very tired.    [name] presents for follow up after having covid dx on 03/12/24. Completed zpack at the beginning of the course. Was unable to take antiviral due to being out of treatment window upon dx. Just recently finished Augmentin  for URI. She presents today due to continued fatigue and continued cough. States the Augmentin  made the mucous less productive and changed if from green to clear. Her main concern is continued fatigue, just doesn't want to get up and do her normal activities. Was able to get on the treadmill twice last week, but walked at half the pace. Denies SOB or CP, but admits to continued cough that seems to respond to cough drops. She continues to use a [employer] version antihistamine nasal spray and saline lavage machine. Additionally, she continues to take Quercetin with zinc. No HA, fever, sinus pain or additional sx.     Patient Active Problem List   Diagnosis Date Noted   Other fatigue 10/29/2023   Right hip pain 10/29/2023   Multiple thyroid  nodules 10/07/2023   Statin intolerance 08/28/2023   Aortic dilatation (HCC) 08/28/2023   DDD (degenerative disc disease), cervical 07/12/2023   Lipoma of skin and subcutaneous tissue of trunk 11/14/2021   Essential hypertension 09/28/2019   Hiatal hernia 08/15/2019   Osteoarthritis of knee 08/15/2019   Hyperlipidemia 05/17/2018   Seasonal allergies 09/17/2015   GAD (generalized anxiety disorder) 03/12/2015   Hx of herpes labialis 03/12/2015   GERD 07/14/2007   Past Medical History:  Diagnosis Date   Acute meniscal tear of left knee    treated by Dr Jeanmarie Millet date pending   Allergy Unknow    Sulfer   Arthritis    Asthma    as a child   Baker's cyst of knee    left knee-treated by Dr Marland Silvas   Cataract 2024   Removed 2024   Complication of anesthesia    Patient states she woke during an endoscopy but no problems since then   GAD (generalized anxiety disorder) 03/12/2015   -with rare panic disorder related to caring for grandaughter    GERD (gastroesophageal reflux disease)    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   Hypertension    PONV (postoperative nausea and vomiting)    Seasonal allergies    Spider veins 11/23/2013   Past Surgical History:  Procedure Laterality Date   BREAST BIOPSY     left   COLONOSCOPY  2005   Dr Nelson Bandy   DENTAL SURGERY     Cyst on right bottom tooth   SUPRACLAVICAL NODE BIOPSY Left 09/01/2022   Procedure: EXCISION LIPOMA LEFT SUPRACLAVICAL FOSSA;  Surgeon: Zelphia Higashi, MD;  Location: [facility] OR;  Service: Thoracic;  Laterality: Left;   TOE SURGERY     TONSILLECTOMY AND ADENOIDECTOMY     UPPER GASTROINTESTINAL ENDOSCOPY     Social History   Tobacco Use   Smoking status: Never   Smokeless tobacco: Never  Vaping Use   Vaping status: Never Used  Substance Use Topics   Alcohol use: Yes    Alcohol/week: 8.0 standard drinks of alcohol    Types: 8  Glasses of wine per week    Comment: Social   Drug use: No      ROS: as noted in HPI  Objective:     BP 136/84   Pulse 61   Temp 98.4 F (36.9 C) (Oral)   Wt 160 lb 1.9 oz (72.6 kg)   LMP 10/20/2000   SpO2 97%   BMI 26.65 kg/m  BP Readings from Last 3 Encounters:  03/28/24 136/84  03/10/24 (!) 155/79  02/15/24 (!) 160/70   Wt Readings from Last 3 Encounters:  03/28/24 160 lb 1.9 oz (72.6 kg)  03/10/24 160 lb 12.8 oz (72.9 kg)  02/15/24 162 lb (73.5 kg)      Physical Exam Vitals and nursing note reviewed.  Constitutional:      General: She is not in acute distress.    Appearance: Normal appearance. She is normal weight. She is not ill-appearing, toxic-appearing or  diaphoretic.  HENT:     Head: Normocephalic and atraumatic.     Right Ear: Tympanic membrane, ear canal and external ear normal. There is no impacted cerumen.     Left Ear: Tympanic membrane, ear canal and external ear normal. There is no impacted cerumen.     Nose: Nose normal. No congestion or rhinorrhea.     Mouth/Throat:     Mouth: Mucous membranes are moist.     Pharynx: Oropharynx is clear. No oropharyngeal exudate or posterior oropharyngeal erythema.  Eyes:     General: No scleral icterus.       Right eye: No discharge.        Left eye: No discharge.     Extraocular Movements: Extraocular movements intact.     Pupils: Pupils are equal, round, and reactive to light.  Cardiovascular:     Rate and Rhythm: Normal rate and regular rhythm.     Heart sounds: Normal heart sounds. No murmur heard.    No friction rub.  Pulmonary:     Effort: Pulmonary effort is normal. No respiratory distress.     Breath sounds: Normal breath sounds. No stridor. No wheezing, rhonchi or rales.  Musculoskeletal:     Cervical back: Normal range of motion and neck supple. No rigidity or tenderness.  Lymphadenopathy:     Cervical: No cervical adenopathy.  Skin:    General: Skin is warm and dry.     Coloration: Skin is not jaundiced.     Findings: No bruising, erythema or rash.  Neurological:     Mental Status: She is alert.      No results found for any visits on 03/28/24.    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 Take with breakfast  Dispense: 5 tablet; Refill: 0  Acute cough -     dexAMETHasone ; Take 1 tablet (6 mg total) by mouth daily for 5 days. Take with breakfast  Dispense: 5 tablet; Refill: 0  No signs of pneumonia or continued bacterial URI. Pt sx likely post-viral/ inflammatory in nature. Discussed typical nature of post-covid  symptoms. Pt requesting dexamethasone  which I feel is appropriate. She can continue her other OTC meds as discussed.    No follow-ups on file.   Mandy Second, PA

## 2024-06-03 ENCOUNTER — Telehealth: Payer: Self-pay | Admitting: Urgent Care

## 2024-06-03 NOTE — Telephone Encounter (Signed)
 Patient is needing a medication refill for  olmesartan -hydrochlorothiazide (BENICAR  HCT) 20-12.5 MG tablet     To hold her until her new PT appointment at [facility] in October. She states she only has 4 left and at her December 30 2023 (physical) with this clinic she was requested to come back in 6 months. Please advise patient if the medication refill can still be done or not.    Copied from CRM #[id number]. Topic: Clinical - Medication Refill >> Jun 03, 2024 10:39 AM Chasity T wrote: Medication: olmesartan -hydrochlorothiazide (BENICAR  HCT) 20-12.5 MG tablet   Has the patient contacted their pharmacy? No   This is the patient's preferred pharmacy:  [id number] [place], [place] - [address] [place] [address] Phone: [phone] Fax: [phone]  Is this the correct pharmacy for this prescription? Yes If no, delete pharmacy and type the correct one.   Has the prescription been filled recently? No  Is the patient out of the medication? Yes  Has the patient been seen for an appointment in the last year OR does the patient have an upcoming appointment? Yes  Can we respond through MyChart? Yes  Agent: Please be advised that Rx refills may take up to 3 business days. We ask that you follow-up with your pharmacy.

## 2024-06-05 ENCOUNTER — Other Ambulatory Visit: Payer: Self-pay | Admitting: Urgent Care

## 2024-06-05 DIAGNOSIS — I10 Essential (primary) hypertension: Secondary | ICD-10-CM

## 2024-06-06 ENCOUNTER — Encounter: Payer: Self-pay | Admitting: Urgent Care

## 2024-06-06 NOTE — Telephone Encounter (Signed)
 Communication  Medication: olmesartan -hydrochlorothiazide (BENICAR  HCT) 20-12.5 MG tablet            Has the patient contacted their pharmacy? No            This is the patient's preferred pharmacy:    [id number] [place], [place] - [address]    [address]    [place] [address]    Phone: [phone] Fax: [phone]        Is this the correct pharmacy for this prescription? Yes    If no, delete pharmacy and type the correct one.        Has the prescription been filled recently? No        Is the patient out of the medication? Yes        Has the patient been seen for an appointment in the last year OR does the patient have an upcoming appointment? Yes        Can we respond through MyChart? Yes

## 2024-06-09 DIAGNOSIS — H43813 Vitreous degeneration, bilateral: Secondary | ICD-10-CM | POA: Diagnosis not present

## 2024-06-09 DIAGNOSIS — H04123 Dry eye syndrome of bilateral lacrimal glands: Secondary | ICD-10-CM | POA: Diagnosis not present

## 2024-06-29 ENCOUNTER — Ambulatory Visit: Admitting: Urgent Care

## 2024-07-05 ENCOUNTER — Ambulatory Visit: Payer: Self-pay

## 2024-07-05 NOTE — Telephone Encounter (Signed)
 FYI Only or Action Required?: Action required by provider: request for appointment. In office or virtual.  Patient was last seen in primary care on 03/28/2024 by Lowella Folks L, PA.  Called Nurse Triage reporting Rash and Medication Reaction.  Symptoms began when pt started taking Benicar .  Interventions attempted: Other: D/c'd Benicar  - rash has resolved.  Symptoms are: rapidly improving.  Triage Disposition: No disposition on file.  Patient/caregiver understands and will follow disposition?: yes                    Copied from CRM [id number]. Topic: Clinical - Red Word Triage >> Jul 05, 2024 10:01 AM Pinkey ORN wrote: Red Word that prompted transfer to Nurse Triage: Rash >> Jul 05, 2024 10:04 AM Pinkey ORN wrote: Patient states she's having a reaction to her blood pressure medication olmesartan -hydrochlorothiazide (BENICAR  HCT) 20-12.5 MG tablet , that has now caused a rash in the back neck and has now ran into her head.. Patient mentioned the medication has an awful smell, the previous ones didn't.  Answer Assessment - Initial Assessment Questions Pt has had a rash since starting on Benicar . Rash has been on the back of her neck and on her head. Rash was very itchy. Pt used hydrocortisone and occasionally oral antihistamines for relief. Pt recently refilled Benicar  . New bottle has a strong smell and rash worsened. Pt did not take this medication yesterday dn rash and itching have resolved. Pt will discontinue Benicar  and will monitor BP.         1. APPEARANCE of RASH: What does the rash look like? (e.g., blisters, dry flaky skin, red spots, redness, sores)     Rash on back of neck and now onto head 2. LOCATION: Where is the rash located?      Back of head and neck 3. NUMBER: How many spots are there?      Mostly resolved  5. ONSET: When did the rash start?      Started when she started benicar  6. ITCHING: Does the rash itch? If Yes, ask: How bad  is the itch?  (Scale 0-10; or none, mild, moderate, severe)     Yes - used hydrocortisone to help with itch. 7. PAIN: Does the rash hurt? If Yes, ask: How bad is the pain?  (Scale 0-10; or none, mild, moderate, severe)     A little 8. OTHER SYMPTOMS: Do you have any other symptoms? (e.g., fever)     no  Protocols used: Rash or Redness - Localized-A-AH

## 2024-07-06 NOTE — Telephone Encounter (Signed)
 Patient and get more information about her reaction to the Benicar  it looks like we will probably need to add it to her allergy list.  And then find out if she has taken anything else in the past for her blood pressure that worked well for her.

## 2024-07-07 ENCOUNTER — Telehealth: Payer: Self-pay

## 2024-07-07 NOTE — Telephone Encounter (Signed)
 Attempted call to patient. Left a voice mail message requesting a return call.

## 2024-07-07 NOTE — Telephone Encounter (Signed)
 Pt returned call. She states that rash is nearly completely gone - some mild itching remains. Blood pressure are increasing.  Pt will continue to monitor and will call back if needed, or if she develops Dizziness, HA. Pt has also tried Lisinopril  in 2020 - 2021. That medication gave her muscle cramps.     Copied from CRM [id number]. Topic: Clinical - Red Word Triage >> Jul 05, 2024 10:01 AM Pinkey ORN wrote: Red Word that prompted transfer to Nurse Triage: Rash >> Jul 07, 2024  5:20 PM Corin V wrote: Patient returning call to [facility]. CAL closed for day, transferred to NT to get clinical follow up information. >> Jul 05, 2024 10:04 AM Pinkey ORN wrote: Patient states she's having a reaction to her blood pressure medication olmesartan -hydrochlorothiazide (BENICAR  HCT) 20-12.5 MG tablet , that has now caused a rash in the back neck and has now ran into her head.. Patient mentioned the medication has an awful smell, the previous ones didn't.

## 2024-07-11 NOTE — Telephone Encounter (Signed)
 Call patient and talk with her and see it looks like she has been on this medication and it does not look like it is new so I am just curious why she is is suspicious of the blood pressure pill causing the rash?

## 2024-07-11 NOTE — Telephone Encounter (Signed)
 Message was received on 07/07/2024 but went to Whitney crain in basket so I was not aware that was received until today .

## 2024-07-11 NOTE — Telephone Encounter (Signed)
 07/07/24  5:24 PM Note Pt returned call. She states that rash is nearly completely gone - some mild itching remains. Blood pressure are increasing.  Pt will continue to monitor and will call back if needed, or if she develops Dizziness, HA. Pt has also tried Lisinopril  in 2020 - 2021. That medication gave her muscle cramps.        Copied from CRM [id number]. Topic: Clinical - Red Word Triage >> Jul 05, 2024 10:01 AM Pinkey ORN wrote: Red Word that prompted transfer to Nurse Triage: Rash >> Jul 07, 2024  5:20 PM Corin V wrote: Patient returning call to [facility]. CAL closed for day, transferred to NT to get clinical follow up information. >> Jul 05, 2024 10:04 AM Pinkey ORN wrote: Patient states she's having a reaction to her blood pressure medication olmesartan -hydrochlorothiazide (BENICAR  HCT) 20-12.5 MG tablet , that has now caused a rash in the back neck and has now ran into her head.. Patient mentioned the medication has an awful smell, the previous ones didn't.

## 2024-07-11 NOTE — Telephone Encounter (Signed)
 Left for a return call

## 2024-07-12 ENCOUNTER — Ambulatory Visit: Payer: Self-pay

## 2024-07-12 ENCOUNTER — Other Ambulatory Visit: Payer: Self-pay | Admitting: Family Medicine

## 2024-07-12 NOTE — Telephone Encounter (Signed)
 FYI Only or Action Required?: FYI only for provider.  Patient was last seen in primary care on 03/28/2024 by Lowella Benton CROME, PA.  Called Nurse Triage reporting Rash.  Symptoms began yesterday.  Interventions attempted: Rest, hydration, or home remedies.  Symptoms are: unchanged.  Triage Disposition: See Physician Within 24 Hours  Patient/caregiver understands and will follow disposition?: Yes, will follow disposition  Copied from CRM #[id number]. Topic: Clinical - Red Word Triage >> Jul 12, 2024 11:21 AM Rosina BIRCH wrote: Reason for CRM: allergic reaction to blood pressure medication. Patient has a rash Reason for Disposition  [1] Localized rash is very painful AND [2] no fever  Answer Assessment - Initial Assessment Questions 1. APPEARANCE of RASH: What does the rash look like? (e.g., blisters, dry flaky skin, red spots, redness, sores)     Red, swollen, itches 2. LOCATION: Where is the rash located?      Neck back,, over the spine,  3. NUMBER: How many spots are there?      One spot 5. ONSET: When did the rash start?      yesterday 6. ITCHING: Does the rash itch? If Yes, ask: How bad is the itch?  (Scale 0-10; or none, mild, moderate, severe)     Moderate last night, mild at this time, has used hydrocortisone cream 7. PAIN: Does the rash hurt? If Yes, ask: How bad is the pain?  (Scale 0-10; or none, mild, moderate, severe)     No pain at this time, did have pain over night 8. OTHER SYMPTOMS: Do you have any other symptoms? (e.g., fever)     Denies  Denies respiratory s/s.  Protocols used: Rash or Redness - Localized-A-AH

## 2024-07-12 NOTE — Telephone Encounter (Signed)
 Spoke with patient.  She had restarted the Benicar  ( generic ) due to continuing elevated BP readings x Saturday 07/09/2024 She developed a pruritic rash and knott on Right side of neck ( more posterior) - the knott has mostly resolved but now still has pruritic rash with streaking   Offered patient appt with Benton Gave for evaluation on Friday 07/15/2024 She will stop the BP  medication And wait to see if symptoms improve  If she feels she does need to be seen she will go to Urgent care for treatment.

## 2024-07-13 ENCOUNTER — Ambulatory Visit (INDEPENDENT_AMBULATORY_CARE_PROVIDER_SITE_OTHER): Admitting: Family Medicine

## 2024-07-13 ENCOUNTER — Encounter: Payer: Self-pay | Admitting: Family Medicine

## 2024-07-13 VITALS — BP 177/77 | HR 66 | Temp 97.7°F | Ht 65.0 in | Wt 161.2 lb

## 2024-07-13 DIAGNOSIS — I10 Essential (primary) hypertension: Secondary | ICD-10-CM

## 2024-07-13 DIAGNOSIS — Z889 Allergy status to unspecified drugs, medicaments and biological substances status: Secondary | ICD-10-CM

## 2024-07-13 DIAGNOSIS — Z23 Encounter for immunization: Secondary | ICD-10-CM

## 2024-07-13 MED ORDER — AMLODIPINE BESYLATE 5 MG PO TABS: 5.0000 mg | ORAL_TABLET | Freq: Every day | ORAL | 3 refills | Status: DC

## 2024-07-13 NOTE — Telephone Encounter (Signed)
 Had spoken to patient yesterday . She  stated she would await to see [facility] provider. She was going to contact them to see if she could be seen at a sooner appt - Patient was seen today 07/13/2024 at [facility] at [place].

## 2024-07-13 NOTE — Patient Instructions (Addendum)
 Please follow up if symptoms do not improve or as needed.    Please start Zyrtec 10 mg daily for the next week.  Stop olmesartan  HCTZ.  Start amlodipine  5 mg daily and monitor blood pressures at home.  Let me know if things are not improving.

## 2024-07-13 NOTE — Progress Notes (Signed)
 Subjective  CC:  Chief Complaint  Patient presents with   Rash    Pt stated that she has been having a rash on/off for about a year.     Same day acute visit; PCP not available. New pt to me. Chart reviewed.  HPI: [name] is a 73 y.o. female who presents to the office today to address the problems listed above in the chief complaint. Very pleasant 73 year old female with history of hypertension who presents with recurrent rash on the back of her neck.  She says the rash has been ongoing for about a year.  She gets redness and itching with some swelling in the back of her neck and the scalp line.  Symptoms will last for hours to a day.  Then disappear.  These rashes have been occurring on a weekly basis since starting her olmesartan  HCTZ about a year ago.  She has seen a dermatologist who was not helpful.  She has no other rash.  No diffuse papules.  No history of chronic urticaria.  No systemic symptoms.  She stopped her blood pressure medicines for about 5 days and the rash resolved.  However she restarted 2 days ago due to elevated blood pressure and rash occurred last night.  No rash at the moment but she does show me pictures.  Creams have not been helpful.  She has stopped all hair products and lotions.  No new soaps or detergents. Essential hypertension treated with olmesartan  HCTZ for about a year.  She said she failed lisinopril  in the past due to body aches.  No other blood pressure medications noted.  No cardiovascular disease.  Nondiabetic. Eligible for flu shot today.  Assessment  1. Drug allergy   2. Essential hypertension   3. Need for influenza vaccination      Plan  Possible allergic drug reaction/urticaria: Unclear etiology but will see if it is related to her olmesartan  HCTZ.  Stop olmesartan  HCTZ and start amlodipine  5 mg daily.  She will monitor her blood pressure at home.  We may be able to titrate dose up if tolerated.  If rash recurs, then could consider returning  back to olmesartan .  Patient understands and agrees.  In the meantime, recommend Zyrtec nightly.  She will follow-up with me if symptoms do not resolve.  Of note, rash picture is macular and red, atypical hive.  No papules.  No flaking.  Consistent with seborrheic dermatitis. Essential hypertension: Currently uncontrolled due to not using medications.  Change amlodipine  5 and monitor at home.  Recheck 2 weeks Flu shot updated  Follow up: Establish care with Dr. Wendolyn as scheduled 08/10/2024  Orders Placed This Encounter  Procedures   Flu vaccine HIGH DOSE PF(Fluzone Trivalent)   Meds ordered this encounter  Medications   amLODipine  (NORVASC ) 5 MG tablet    Sig: Take 1 tablet (5 mg total) by mouth daily.    Dispense:  90 tablet    Refill:  3      I reviewed the patients updated PMH, FH, and SocHx.    Patient Active Problem List   Diagnosis Date Noted   Other fatigue 10/29/2023   Right hip pain 10/29/2023   Multiple thyroid  nodules 10/07/2023   Statin intolerance 08/28/2023   Aortic dilatation 08/28/2023   DDD (degenerative disc disease), cervical 07/12/2023   Lipoma of skin and subcutaneous tissue of trunk 11/14/2021   Essential hypertension 09/28/2019   Hiatal hernia 08/15/2019   Osteoarthritis of knee 08/15/2019  Hyperlipidemia 05/17/2018   Seasonal allergies 09/17/2015   GAD (generalized anxiety disorder) 03/12/2015   Hx of herpes labialis 03/12/2015   GERD 07/14/2007   Current Meds  Medication Sig   amLODipine  (NORVASC ) 5 MG tablet Take 1 tablet (5 mg total) by mouth daily.   fluticasone (FLONASE) 50 MCG/ACT nasal spray Place 1 spray into both nostrils daily.   Multiple Vitamins-Minerals (HAIR/SKIN/NAILS/BIOTIN PO) Take 1 capsule by mouth daily.   Multiple Vitamins-Minerals (MULTIVITAMIN PO) Take 1 tablet by mouth daily.   Probiotic Product (PROBIOTIC PO) Take 1 capsule by mouth daily.   valACYclovir  (VALTREX ) 1000 MG tablet Take 2 tabs PO Q 12 hours x 1 day; start  ASAP after symptom onset   zolpidem  (AMBIEN ) 5 MG tablet Take 1 tablet (5 mg total) by mouth at bedtime as needed for sleep.   [DISCONTINUED] olmesartan -hydrochlorothiazide (BENICAR  HCT) 20-12.5 MG tablet TAKE 1 TABLET BY MOUTH EVERY DAY    Allergies: Patient is allergic to lisinopril , statins, sulfonamide derivatives, zetia  [ezetimibe ], and cefdinir. Family History: Patient family history includes Arthritis in her mother; Cancer in her mother; Diabetes in her maternal grandmother, mother, and sister; Heart disease in her mother. Social History:  Patient  reports that she has never smoked. She has never used smokeless tobacco. She reports current alcohol use of about 8.0 standard drinks of alcohol per week. She reports that she does not use drugs.  Review of Systems: Constitutional: Negative for fever malaise or anorexia Cardiovascular: negative for chest pain Respiratory: negative for SOB or persistent cough Gastrointestinal: negative for abdominal pain  Objective  Vitals: BP (!) 177/77   Pulse 66   Temp 97.7 F (36.5 C)   Ht 5' 5 (1.651 m)   Wt 161 lb 3.2 oz (73.1 kg)   LMP 10/20/2000   SpO2 97%   BMI 26.83 kg/m  General: no acute distress , A&Ox3 HEENT: PEERL, conjunctiva normal, neck is supple Cardiovascular:  RRR without murmur or gallop.  Respiratory:  Good breath sounds bilaterally, CTAB with normal respiratory effort Skin:  Warm, no rashes  Commons side effects, risks, benefits, and alternatives for medications and treatment plan prescribed today were discussed, and the patient expressed understanding of the given instructions. Patient is instructed to call or message via MyChart if he/she has any questions or concerns regarding our treatment plan. No barriers to understanding were identified. We discussed Red Flag symptoms and signs in detail. Patient expressed understanding regarding what to do in case of urgent or emergency type symptoms.  Medication list was  reconciled, printed and provided to the patient in AVS. Patient instructions and summary information was reviewed with the patient as documented in the AVS. This note was prepared with assistance of Dragon voice recognition software. Occasional wrong-word or sound-a-like substitutions may have occurred due to the inherent limitations of voice recognition software

## 2024-07-18 DIAGNOSIS — Z1212 Encounter for screening for malignant neoplasm of rectum: Secondary | ICD-10-CM | POA: Diagnosis not present

## 2024-07-22 ENCOUNTER — Encounter: Payer: Self-pay | Admitting: Family Medicine

## 2024-07-22 LAB — COLOGUARD: COLOGUARD: NEGATIVE

## 2024-08-02 ENCOUNTER — Other Ambulatory Visit: Payer: Self-pay | Admitting: Family Medicine

## 2024-08-02 DIAGNOSIS — Z1231 Encounter for screening mammogram for malignant neoplasm of breast: Secondary | ICD-10-CM

## 2024-08-08 ENCOUNTER — Other Ambulatory Visit: Payer: Self-pay | Admitting: Thoracic Surgery (Cardiothoracic Vascular Surgery)

## 2024-08-08 DIAGNOSIS — E041 Nontoxic single thyroid nodule: Secondary | ICD-10-CM

## 2024-08-10 ENCOUNTER — Ambulatory Visit: Admitting: Family Medicine

## 2024-08-10 ENCOUNTER — Encounter: Payer: Self-pay | Admitting: Family Medicine

## 2024-08-10 VITALS — BP 134/56 | HR 92 | Temp 97.8°F | Resp 18 | Ht 65.0 in | Wt 161.0 lb

## 2024-08-10 DIAGNOSIS — G44039 Episodic paroxysmal hemicrania, not intractable: Secondary | ICD-10-CM | POA: Diagnosis not present

## 2024-08-10 DIAGNOSIS — R591 Generalized enlarged lymph nodes: Secondary | ICD-10-CM

## 2024-08-10 DIAGNOSIS — M170 Bilateral primary osteoarthritis of knee: Secondary | ICD-10-CM | POA: Diagnosis not present

## 2024-08-10 DIAGNOSIS — E042 Nontoxic multinodular goiter: Secondary | ICD-10-CM

## 2024-08-10 DIAGNOSIS — I77819 Aortic ectasia, unspecified site: Secondary | ICD-10-CM

## 2024-08-10 DIAGNOSIS — R21 Rash and other nonspecific skin eruption: Secondary | ICD-10-CM | POA: Diagnosis not present

## 2024-08-10 DIAGNOSIS — I10 Essential (primary) hypertension: Secondary | ICD-10-CM | POA: Diagnosis not present

## 2024-08-10 MED ORDER — TRIAMCINOLONE ACETONIDE 0.1 % EX CREA: 1.0000 | TOPICAL_CREAM | Freq: Two times a day (BID) | CUTANEOUS | 0 refills | Status: AC

## 2024-08-10 MED ORDER — AMLODIPINE BESYLATE 5 MG PO TABS
5.0000 mg | ORAL_TABLET | Freq: Two times a day (BID) | ORAL | 3 refills | Status: AC
Start: 2024-08-10 — End: ?

## 2024-08-10 NOTE — Progress Notes (Signed)
 Subjective:     Patient ID: [name], female    DOB: 12/08/1950, 73 y.o.   MRN: [id number]  Chief Complaint  Patient presents with   Establish Care   Rash    Neck -- itching , redness     Discussed the use of AI scribe software for clinical note transcription with the patient, who gave verbal consent to proceed.  History of Present Illness [name] is a 73 year old female who presents with concerns about medication side effects and a recurring neck rash.  She is experiencing significant ankle swelling, which she attributes to her current blood pressure medication, amlodipine , taken at a dose of 5 mg twice daily as 10mg  at once-fatigue and worse swelling.  5mg , bp not controlled. Her blood pressure readings have improved, but she has had adverse reactions to other antihypertensives, including joint pain with lisinopril  and a rash with Benicar . She manages her blood pressure with lifestyle modifications, including walking on a treadmill, which does not exacerbate her knee pain.  She reports a recurring rash and swelling in her neck, described as a 'walnut size knot' that is itchy and painful, appearing on either side of her neck intermittently over the past six months. The rash is associated with headaches and neck pain, resolving after a few days with cortisone cream and ibuprofen . No new allergens or changes in personal care products have been identified to explain the rash. No h/o HA/migraine  She has a history of arthritis, particularly affecting her thumb, but she is still able to play golf two to three times a week. She uses Voltaren  gel for arthritis pain and has received HA shots for knee pain, which have been effective. She avoids knee replacement surgery and manages her activity level to prevent exacerbation of symptoms.  She has a history of asthma and reports that a previous doctor told her she has a little bit of COPD in her upper chest. She notes both her parents and her  first husband smoked, and wonders if her symptoms are related to asthma, secondhand smoke, or a combination. She experiences a rattling sound in her chest when lying on her right side and chest discomfort when lying on her left side, which she manages by sleeping on her back.  She has a history of cataract surgery and is currently experiencing cloudiness in her vision due to fluid behind her eyes, for which she is scheduled to see a retina specialist. She also reports a history of a left breast biopsy, which revealed lipoma, and a recent colon cancer screening via Cologuard, which was normal.  She has a thoracic aneury-managed by [facility]-upcoming CT    Health Maintenance Due  Topic Date Due   Medicare Annual Wellness (AWV)  03/01/2024   COVID-19 Vaccine (4 - 2025-26 season) 06/20/2024    Past Medical History:  Diagnosis Date   Acute meniscal tear of left knee    treated by Dr Denys date pending   Allergy Unknow   Sulfer   Arthritis    Asthma    as a child   Baker's cyst of knee    left knee-treated by Dr Shari   Cataract 2024   Removed 2024   Complication of anesthesia    Patient states she woke during an endoscopy but no problems since then   GAD (generalized anxiety disorder) 03/12/2015   -with rare panic disorder related to caring for grandaughter    GERD (gastroesophageal reflux disease)    H/O  cold sores 03/12/2015   Hypertension    PONV (postoperative nausea and vomiting)    Seasonal allergies    Spider veins 11/23/2013   Thoracic ascending aortic aneurysm     Past Surgical History:  Procedure Laterality Date   BREAST BIOPSY     left   CATARACT EXTRACTION Bilateral    COLONOSCOPY  2005   Dr Sheppard Finn   DENTAL SURGERY     Cyst on right bottom tooth   SUPRACLAVICAL NODE BIOPSY Left 09/01/2022   Procedure: EXCISION LIPOMA LEFT SUPRACLAVICAL FOSSA;  Surgeon: Kerrin Elspeth BROCKS, MD;  Location: [facility] OR;  Service: Thoracic;  Laterality: Left;   TOE  SURGERY     TONSILLECTOMY AND ADENOIDECTOMY     UPPER GASTROINTESTINAL ENDOSCOPY       Current Outpatient Medications:    triamcinolone  cream (KENALOG ) 0.1 %, Apply 1 Application topically 2 (two) times daily., Disp: 30 g, Rfl: 0   amLODipine  (NORVASC ) 5 MG tablet, Take 1 tablet (5 mg total) by mouth in the morning and at bedtime., Disp: 180 tablet, Rfl: 3   fluticasone (FLONASE) 50 MCG/ACT nasal spray, Place 1 spray into both nostrils daily., Disp: , Rfl:    Multiple Vitamins-Minerals (HAIR/SKIN/NAILS/BIOTIN PO), Take 1 capsule by mouth daily., Disp: , Rfl:    Multiple Vitamins-Minerals (MULTIVITAMIN PO), Take 1 tablet by mouth daily., Disp: , Rfl:    Probiotic Product (PROBIOTIC PO), Take 1 capsule by mouth daily., Disp: , Rfl:    valACYclovir  (VALTREX ) 1000 MG tablet, Take 2 tabs PO Q 12 hours x 1 day; start ASAP after symptom onset, Disp: 20 tablet, Rfl: 2  Allergies  Allergen Reactions   Lisinopril  Other (See Comments)    Muscle cramping.    Statins Other (See Comments)    myalgias   Sulfonamide Derivatives Hives   Zetia  [Ezetimibe ] Diarrhea   Cefdinir Rash   Olmesartan  Rash    Was on olmesartan  hydrochlorothiazide; rash resolved with stopping   ROS neg/noncontributory except as noted HPI/below      Objective:     BP (!) 134/56   Pulse 92   Temp 97.8 F (36.6 C)   Resp 18   Ht 5' 5 (1.651 m)   Wt 161 lb (73 kg)   LMP 10/20/2000   SpO2 98%   BMI 26.79 kg/m  Wt Readings from Last 3 Encounters:  08/10/24 161 lb (73 kg)  07/13/24 161 lb 3.2 oz (73.1 kg)  03/28/24 160 lb 1.9 oz (72.6 kg)    Physical Exam GENERAL: Well developed, well nourished, no acute distress. HEAD EYES EARS NOSE THROAT: Normocephalic, atraumatic, conjunctiva not injected, sclera nonicteric. CARDIAC: Regular rate and rhythm, S1 S2 present, no murmur, dorsalis pedis 2+ bilaterally. NECK: Supple, no thyromegaly, mobile 6-7 mm nodule posterior to SCM on the right side, no carotid  bruits. LUNGS: Clear to auscultation bilaterally, no wheezes. ABDOMEN: Bowel sounds present, soft, non-tender, non-distended, no hepatosplenomegaly, no masses. EXTREMITIES: 1+ edema in legs bilaterally. MUSCULOSKELETAL: No gross abnormalities. NEUROLOGICAL: Alert and oriented x3, cranial nerves II through XII intact. PSYCHIATRIC: Normal mood, good eye contact. SKIN: Rash on neck, right side, slightly red.  I personally spent a total of 50 minutes in the care of the patient today including preparing to see the patient, getting/reviewing separately obtained history, performing a medically appropriate exam/evaluation, counseling and educating, placing orders, documenting clinical information in the EHR, independently interpreting results, and communicating results.        Assessment & Plan:  Essential hypertension -  amLODIPine  Besylate; Take 1 tablet (5 mg total) by mouth in the morning and at bedtime.  Dispense: 180 tablet; Refill: 3 -     CBC with Differential/Platelet -     Comprehensive metabolic panel with GFR  Rash  Episodic paroxysmal hemicrania, not intractable -     MR BRAIN WO CONTRAST; Future  Lymphadenopathy -     CT SOFT TISSUE NECK W CONTRAST; Future -     CBC with Differential/Platelet -     Comprehensive metabolic panel with GFR  Multiple thyroid  nodules  Aortic dilatation  Primary osteoarthritis of both knees  Other orders -     Triamcinolone  Acetonide; Apply 1 Application topically 2 (two) times daily.  Dispense: 30 g; Refill: 0    Assessment and Plan Assessment & Plan Hypertension with lower extremity edema   Hypertension is managed with amlodipine  5 mg twice daily. She reports significant lower extremity edema, likely due to amlodipine . Previous antihypertensives caused adverse effects. Blood pressure readings at home 130's/70's. Options for managing edema, including compression stockings and medication adjustments, were discussed. Continue amlodipine  5  mg twice daily, wear compression stockings daily, monitor blood pressure at home, and refill amlodipine  prescription.  Recurrent cervical lymphadenopathy with episodic rash and headache   She experiences recurrent cervical lymphadenopathy with episodic rash and headache, occurring intermittently over the past six months. Symptoms include a walnut-sized knot, rash, itching, and headache, lasting several days before resolving. No history of migraines. A recent CT scan planned for aortic atherosclerosis may provide additional information. Order a CT scan of the neck to evaluate lymphadenopathy and an MRI of the brain to assess potential causes of new-onset headaches. Prescribe triamcinolone  cream for rash management and instruct to take ibuprofen  or Aleve for headache and pain as needed. Advise taking pictures of the rash during flare-ups for dermatologist review.  Not sure if complex migraine, contact derm, other.  Osteoarthritis of knee and right thumb   Osteoarthritis primarily affects the knee and right thumb, with pain in the thumb and knee, exacerbated by activity. She remains active, playing golf regularly, and prefers to avoid knee replacement surgery. Current management includes activity modification and use of Voltaren  gel. Continue current management and encourage regular low-impact exercise, such as walking on a treadmill.  Aortic atherosclerosis with planned CT surveillance   Aortic atherosclerosis with a known blockage is under surveillance. A CT scan is scheduled for November 20 to monitor the condition. Proceed with the scheduled CT scan on November 20.  Thyroid  nodule under surveillance   A thyroid  nodule was previously evaluated with ultrasound and biopsy attempt. Biopsy was not performed due to nodule characteristics. A follow-up ultrasound is planned for January to reassess the nodule. Plan for follow-up thyroid  ultrasound in January.  Hyperlipidemia   Hyperlipidemia is managed with  dietary modifications. Recent cholesterol levels are slightly elevated. She is a [place] and acknowledges the need to reduce dietary fats. Continue dietary modifications to manage cholesterol levels.  Gastroesophageal reflux disease (GERD)   GERD is managed with medications as needed. She experiences occasional stomach issues, especially when traveling.  General Health Maintenance   She is up to date on most vaccinations, including pneumococcal and shingles vaccines. The option of receiving the new pneumococcal vaccine (PCV20) was discussed but decided against as she has already received PCV13 and PPSV23. Recent colon cancer screening with Cologuard was normal. Cataract surgery was performed last year, with follow-up for retinal issues planned. No additional pneumococcal vaccination is needed at this  time. Continue routine health maintenance and screenings as scheduled.     Return in about 6 months (around 02/08/2025) for HTN.  Jenkins CHRISTELLA Carrel, MD

## 2024-08-10 NOTE — Patient Instructions (Addendum)
 It was very nice to see you today!  Get compression stockings  Ordered CT and MRI  Triamcinolone  cream for rash   PLEASE NOTE:  If you had any lab tests please let us  know if you have not heard back within a few days. You may see your results on MyChart before we have a chance to review them but we will give you a call once they are reviewed by us . If we ordered any referrals today, please let us  know if you have not heard from their office within the next week.   Please try these tips to maintain a healthy lifestyle:  Eat most of your calories during the day when you are active. Eliminate processed foods including packaged sweets (pies, cakes, cookies), reduce intake of potatoes, white bread, white pasta, and white rice. Look for whole grain options, oat flour or almond flour.  Each meal should contain half fruits/vegetables, one quarter protein, and one quarter carbs (no bigger than a computer mouse).  Cut down on sweet beverages. This includes juice, soda, and sweet tea. Also watch fruit intake, though this is a healthier sweet option, it still contains natural sugar! Limit to 3 servings daily.  Drink at least 1 glass of water with each meal and aim for at least 8 glasses per day  Exercise at least 150 minutes every week.

## 2024-08-11 ENCOUNTER — Other Ambulatory Visit: Payer: Self-pay | Admitting: Family Medicine

## 2024-08-11 ENCOUNTER — Ambulatory Visit: Payer: Self-pay | Admitting: Family Medicine

## 2024-08-11 LAB — CBC WITH DIFFERENTIAL/PLATELET
Basophils Absolute: 0.1 K/uL (ref 0.0–0.1)
Basophils Relative: 1.1 % (ref 0.0–3.0)
Eosinophils Absolute: 0.2 K/uL (ref 0.0–0.7)
Eosinophils Relative: 3 % (ref 0.0–5.0)
HCT: 40.1 % (ref 36.0–46.0)
Hemoglobin: 13.4 g/dL (ref 12.0–15.0)
Lymphocytes Relative: 37.4 % (ref 12.0–46.0)
Lymphs Abs: 2.8 K/uL (ref 0.7–4.0)
MCHC: 33.4 g/dL (ref 30.0–36.0)
MCV: 94.6 fl (ref 78.0–100.0)
Monocytes Absolute: 1 K/uL (ref 0.1–1.0)
Monocytes Relative: 13.3 % — ABNORMAL HIGH (ref 3.0–12.0)
Neutro Abs: 3.4 K/uL (ref 1.4–7.7)
Neutrophils Relative %: 45.2 % (ref 43.0–77.0)
Platelets: 253 K/uL (ref 150.0–400.0)
RBC: 4.24 Mil/uL (ref 3.87–5.11)
RDW: 12.5 % (ref 11.5–15.5)
WBC: 7.5 K/uL (ref 4.0–10.5)

## 2024-08-11 LAB — COMPREHENSIVE METABOLIC PANEL WITH GFR
ALT: 23 U/L (ref 0–35)
AST: 24 U/L (ref 0–37)
Albumin: 4.3 g/dL (ref 3.5–5.2)
Alkaline Phosphatase: 80 U/L (ref 39–117)
BUN: 20 mg/dL (ref 6–23)
CO2: 29 meq/L (ref 19–32)
Calcium: 9.3 mg/dL (ref 8.4–10.5)
Chloride: 102 meq/L (ref 96–112)
Creatinine, Ser: 0.66 mg/dL (ref 0.40–1.20)
GFR: 86.32 mL/min (ref >60.00)
Glucose, Bld: 101 mg/dL — ABNORMAL HIGH (ref 70–99)
Potassium: 4.2 meq/L (ref 3.5–5.1)
Sodium: 139 meq/L (ref 135–145)
Total Bilirubin: 0.4 mg/dL (ref 0.2–1.2)
Total Protein: 6.5 g/dL (ref 6.0–8.3)

## 2024-08-11 NOTE — Progress Notes (Signed)
 Labs normal.

## 2024-08-12 ENCOUNTER — Ambulatory Visit
Admission: RE | Admit: 2024-08-12 | Discharge: 2024-08-12 | Disposition: A | Discharge status: Home / Self Care | Source: Ambulatory Visit | Attending: Family Medicine | Admitting: Family Medicine

## 2024-08-12 DIAGNOSIS — R599 Enlarged lymph nodes, unspecified: Secondary | ICD-10-CM | POA: Diagnosis not present

## 2024-08-12 DIAGNOSIS — R591 Generalized enlarged lymph nodes: Secondary | ICD-10-CM

## 2024-08-12 MED ORDER — IOHEXOL 300 MG/ML  SOLN
75.0000 mL | Freq: Once | INTRAMUSCULAR | Status: AC | PRN
  Administered 2024-08-12: 75 mL via INTRAVENOUS

## 2024-08-16 ENCOUNTER — Ambulatory Visit
Admission: RE | Admit: 2024-08-16 | Discharge: 2024-08-16 | Disposition: A | Discharge status: Home / Self Care | Source: Ambulatory Visit | Attending: Family Medicine | Admitting: Family Medicine

## 2024-08-16 ENCOUNTER — Other Ambulatory Visit

## 2024-08-16 DIAGNOSIS — R519 Headache, unspecified: Secondary | ICD-10-CM | POA: Diagnosis not present

## 2024-08-16 DIAGNOSIS — G44039 Episodic paroxysmal hemicrania, not intractable: Secondary | ICD-10-CM

## 2024-08-16 NOTE — Progress Notes (Signed)
 Few small lymph nodes-not concerning.

## 2024-08-17 ENCOUNTER — Ambulatory Visit
Admission: RE | Admit: 2024-08-17 | Discharge: 2024-08-17 | Disposition: A | Source: Ambulatory Visit | Attending: Family Medicine | Admitting: Family Medicine

## 2024-08-17 DIAGNOSIS — Z1231 Encounter for screening mammogram for malignant neoplasm of breast: Secondary | ICD-10-CM | POA: Diagnosis not present

## 2024-08-21 ENCOUNTER — Ambulatory Visit: Payer: Self-pay | Admitting: Family Medicine

## 2024-08-21 NOTE — Progress Notes (Signed)
 MRI ok.  Needs to keep blood pressure controlled.

## 2024-08-22 NOTE — Telephone Encounter (Signed)
 FYI

## 2024-08-29 DIAGNOSIS — H43813 Vitreous degeneration, bilateral: Secondary | ICD-10-CM | POA: Diagnosis not present

## 2024-08-29 DIAGNOSIS — H43393 Other vitreous opacities, bilateral: Secondary | ICD-10-CM | POA: Diagnosis not present

## 2024-09-01 ENCOUNTER — Ambulatory Visit (HOSPITAL_COMMUNITY)
Admission: RE | Admit: 2024-09-01 | Discharge: 2024-09-01 | Disposition: A | Discharge status: Home / Self Care | Source: Ambulatory Visit | Attending: Thoracic Surgery (Cardiothoracic Vascular Surgery) | Admitting: Thoracic Surgery (Cardiothoracic Vascular Surgery)

## 2024-09-01 DIAGNOSIS — E041 Nontoxic single thyroid nodule: Secondary | ICD-10-CM | POA: Insufficient documentation

## 2024-09-01 DIAGNOSIS — I700 Atherosclerosis of aorta: Secondary | ICD-10-CM | POA: Diagnosis not present

## 2024-09-01 MED ORDER — IOHEXOL 350 MG/ML SOLN
75.0000 mL | Freq: Once | INTRAVENOUS | Status: AC | PRN
  Administered 2024-09-01: 75 mL via INTRAVENOUS

## 2024-09-01 NOTE — Progress Notes (Signed)
 Stevi Hollinshead                                          MRN: [id number]   09/01/2024   The [facility] Quality Team Specialist reviewed this patient medical record for the purposes of chart review for care gap closure. The following were reviewed: abstraction for care gap closure-controlling blood pressure.    [facility] Quality Team

## 2024-09-12 ENCOUNTER — Ambulatory Visit

## 2024-09-12 VITALS — BP 142/72 | HR 68 | Resp 20 | Ht 65.0 in | Wt 160.0 lb

## 2024-09-12 DIAGNOSIS — E041 Nontoxic single thyroid nodule: Secondary | ICD-10-CM

## 2024-09-12 DIAGNOSIS — I77819 Aortic ectasia, unspecified site: Secondary | ICD-10-CM | POA: Diagnosis not present

## 2024-09-12 NOTE — Progress Notes (Signed)
 [address] [place] [address]             [phone]            [name] [id number] 1951-07-27   History of Present Illness:  [name] is a 73 year old female with medical history of hypertension, hiatal hernia, asthma, GERD, thyroid  nodules, osteoarthritis, anxiety and hyperlipidemia who presents for continued follow-up of aortic dilation.  This was found in 2023 incidentally during her preoperative evaluation for a lipoma resection by Dr. Kerrin.  The aortic dilation has stayed stable in size and on recent CTA measured 3.5 cm. Scan last year also showed thyroid  nodules which were evaluated by ultrasound.   She presents to the clinic today with her husband reports that she is doing well.  She has been having some issues with hypertension medications and is currently working on getting better control.  She had some side effects from the medications and current regimen of amlodipine  5 mg once daily is working.  She does check her blood pressure at home and readings are 130s/80s.  She is an avid golfer, and is very active with this.  She denies chest pain, shortness of breath or lower leg edema.    Current Outpatient Medications on File Prior to Visit  Medication Sig Dispense Refill   amLODipine  (NORVASC ) 5 MG tablet Take 1 tablet (5 mg total) by mouth in the morning and at bedtime. 180 tablet 3   fluticasone (FLONASE) 50 MCG/ACT nasal spray Place 1 spray into both nostrils daily.     Multiple Vitamins-Minerals (HAIR/SKIN/NAILS/BIOTIN PO) Take 1 capsule by mouth daily.     Multiple Vitamins-Minerals (MULTIVITAMIN PO) Take 1 tablet by mouth daily.     Probiotic Product (PROBIOTIC PO) Take 1 capsule by mouth daily.     triamcinolone  cream (KENALOG ) 0.1 % Apply 1 Application topically 2 (two) times daily. 30 g 0   valACYclovir  (VALTREX ) 1000 MG tablet Take 2 tabs PO Q 12 hours x 1 day; start ASAP after symptom onset 20 tablet 2   No current  facility-administered medications on file prior to visit.     ROS: Review of Systems  Constitutional: Negative.  Negative for malaise/fatigue.  Respiratory:  Negative for cough and shortness of breath.   Cardiovascular:  Negative for chest pain and leg swelling.     BP (!) 142/72   Pulse 68   Resp 20   Ht 5' 5 (1.651 m)   Wt 160 lb (72.6 kg)   LMP 10/20/2000   SpO2 96% Comment: RA  BMI 26.63 kg/m   Physical Exam Constitutional:      Appearance: Normal appearance.  HENT:     Head: Normocephalic and atraumatic.  Cardiovascular:     Rate and Rhythm: Normal rate and regular rhythm.     Heart sounds: Normal heart sounds, S1 normal and S2 normal.  Pulmonary:     Effort: Pulmonary effort is normal.     Breath sounds: Normal breath sounds.  Musculoskeletal:     Cervical back: Normal range of motion.  Skin:    General: Skin is warm and dry.  Neurological:     General: No focal deficit present.     Mental Status: She is alert.      Imaging: Similar 3.5 cm dilatation of the aortic isthmus. Recommend annual imaging followup by CTA or MRA. This recommendation follows 2010 ACCF/AHA/AATS/ACR/ASA/SCA/SCAI/SIR/STS/SVM Guidelines for the Diagnosis  and Management of Patients with Thoracic Aortic Disease. Circulation.2010; 121: [id number]. Aortic aneurysm NOS (ICD10-I71.9)     Electronically Signed   By: Vanetta Chou M.D.   On: 09/01/2024 15:39    Addended by Chou Vanetta, MD on 09/01/2024  3:42 PM    Study Result  Narrative & Impression  CLINICAL DATA:  Aortic aneurysm.   EXAM: CT ANGIOGRAPHY CHEST WITH CONTRAST   TECHNIQUE: Multidetector CT imaging of the chest was performed using the standard protocol during bolus administration of intravenous contrast. Multiplanar CT image reconstructions and MIPs were obtained to evaluate the vascular anatomy.   RADIATION DOSE REDUCTION: This exam was performed according to the departmental dose-optimization program  which includes automated exposure control, adjustment of the mA and/or kV according to patient size and/or use of iterative reconstruction technique.   CONTRAST:  75mL OMNIPAQUE  IOHEXOL  350 MG/ML SOLN   COMPARISON:  Chest CT dated 09/09/2023.   FINDINGS: Cardiovascular: There is no cardiomegaly or pericardial effusion. Mild atherosclerotic calcification of the thoracic aorta. Stable 3.5 cm dilatation of the aortic isthmus. No aortic dissection. The origins of the great vessels of the aortic arch are patent. No pulmonary artery embolus identified.   Mediastinum/Nodes: No hilar or mediastinal adenopathy. The esophagus is grossly unremarkable. No mediastinal fluid collection.   Lungs/Pleura: Bibasilar linear atelectasis/scarring. No focal consolidation, pleural effusion or pneumothorax. The central airways are patent.   Upper Abdomen: No acute abnormality.   Musculoskeletal: Degenerative changes of the spine. No acute osseous pathology.   Review of the MIP images confirms the above findings.   IMPRESSION: 1. No acute intrathoracic pathology. No pulmonary artery embolus identified. 2.  Aortic Atherosclerosis (ICD10-I70.0).   Electronically Signed: By: Vanetta Chou M.D. On: 09/01/2024 15:21        A/P: Aortic dilatation -3.5 dilatation of the aortic isthmus on CTA of chest.  -We discussed the natural history and and risk factors for growth of ascending aortic aneurysms. Discussed recommendations to minimize the risk of further expansion or dissection including careful blood pressure control, avoidance of contact sports and heavy lifting, attention to lipid management.  We covered the importance of staying never user of tobacco.  The patient does not yet meet surgical criteria of >5.5cm. The patient is aware of signs and symptoms of aortic dissection and when to present to the emergency department  -Continue to check BP at home to help with medication adjustments -Follow  up in one year with CTA of chest for continued surveillance   Thyroid  nodule -Had recent CT of neck which showed 15 mm nodule on the left lobe.  -She has thyroid  ultrasound scheduled in January 2026 -She is to continue follow up with her PCP    Risk Modification:  Statin:  not currently prescribed  Smoking cessation instruction/counseling given:  never user  Patient was counseled on importance of Blood Pressure Control  They are instructed to contact their Primary Care Physician if they start to have blood pressure readings over 130s/90s. Do not ever stop blood pressure medications on your own, unless instructed by healthcare professional.  Please avoid use of Fluoroquinolones as this can potentially increase your risk of Aortic Rupture and/or Dissection  Patient educated on signs and symptoms of Aortic Dissection, handout also provided in AVS  Manuelita CHRISTELLA Rough, PA-C 09/12/24

## 2024-09-12 NOTE — Patient Instructions (Addendum)

## 2024-09-13 ENCOUNTER — Ambulatory Visit: Admitting: Thoracic Surgery (Cardiothoracic Vascular Surgery)

## 2024-10-03 ENCOUNTER — Encounter: Payer: Self-pay | Admitting: Family Medicine

## 2024-10-04 ENCOUNTER — Other Ambulatory Visit: Payer: Self-pay

## 2024-10-04 DIAGNOSIS — E042 Nontoxic multinodular goiter: Secondary | ICD-10-CM

## 2024-10-04 NOTE — Telephone Encounter (Signed)
 Called pt she want to go to Dr Hosie with [facility] sent referral [facility]
